# Patient Record
Sex: Male | Born: 1940
Health system: Southern US, Community
[De-identification: ages and names within clinical notes are randomized; demographics above are authoritative.]

## PROBLEM LIST (undated history)

## (undated) DIAGNOSIS — K5730 Diverticulosis of large intestine without perforation or abscess without bleeding: Secondary | ICD-10-CM

## (undated) DIAGNOSIS — H409 Unspecified glaucoma: Secondary | ICD-10-CM

## (undated) DIAGNOSIS — C801 Malignant (primary) neoplasm, unspecified: Secondary | ICD-10-CM

## (undated) DIAGNOSIS — E669 Obesity, unspecified: Secondary | ICD-10-CM

## (undated) DIAGNOSIS — T783XXA Angioneurotic edema, initial encounter: Secondary | ICD-10-CM

## (undated) DIAGNOSIS — L723 Sebaceous cyst: Secondary | ICD-10-CM

## (undated) DIAGNOSIS — Z862 Personal history of diseases of the blood and blood-forming organs and certain disorders involving the immune mechanism: Secondary | ICD-10-CM

## (undated) DIAGNOSIS — E0590 Thyrotoxicosis, unspecified without thyrotoxic crisis or storm: Secondary | ICD-10-CM

## (undated) DIAGNOSIS — M25511 Pain in right shoulder: Secondary | ICD-10-CM

## (undated) DIAGNOSIS — Z8546 Personal history of malignant neoplasm of prostate: Secondary | ICD-10-CM

## (undated) DIAGNOSIS — I10 Essential (primary) hypertension: Secondary | ICD-10-CM

## (undated) DIAGNOSIS — E785 Hyperlipidemia, unspecified: Secondary | ICD-10-CM

## (undated) DIAGNOSIS — M25579 Pain in unspecified ankle and joints of unspecified foot: Secondary | ICD-10-CM

## (undated) DIAGNOSIS — Z8639 Personal history of other endocrine, nutritional and metabolic disease: Secondary | ICD-10-CM

## (undated) DIAGNOSIS — D126 Benign neoplasm of colon, unspecified: Secondary | ICD-10-CM

## (undated) HISTORY — DX: Hyperlipidemia, unspecified: E78.5

## (undated) HISTORY — DX: Malignant (primary) neoplasm, unspecified: C80.1

## (undated) HISTORY — DX: Personal history of other endocrine, nutritional and metabolic disease: Z86.39

## (undated) HISTORY — DX: Diverticulosis of large intestine without perforation or abscess without bleeding: K57.30

## (undated) HISTORY — DX: Unspecified glaucoma: H40.9

## (undated) HISTORY — DX: Pain in right shoulder: M25.511

## (undated) HISTORY — PX: APPENDECTOMY: SHX54

## (undated) HISTORY — DX: Obesity, unspecified: E66.9

## (undated) HISTORY — DX: Personal history of diseases of the blood and blood-forming organs and certain disorders involving the immune mechanism: Z86.2

## (undated) HISTORY — DX: Essential (primary) hypertension: I10

## (undated) HISTORY — DX: Thyrotoxicosis, unspecified without thyrotoxic crisis or storm: E05.90

## (undated) HISTORY — DX: Personal history of malignant neoplasm of prostate: Z85.46

## (undated) HISTORY — DX: Benign neoplasm of colon, unspecified: D12.6

## (undated) HISTORY — PX: OTHER SURGICAL HISTORY: SHX169

## (undated) HISTORY — DX: Sebaceous cyst: L72.3

## (undated) HISTORY — DX: Angioneurotic edema, initial encounter: T78.3XXA

## (undated) HISTORY — DX: Pain in unspecified ankle and joints of unspecified foot: M25.579

---

## 2004-12-27 ENCOUNTER — Ambulatory Visit: Payer: Self-pay | Admitting: Family Medicine

## 2005-04-23 ENCOUNTER — Ambulatory Visit: Payer: Self-pay | Admitting: Family Medicine

## 2005-08-22 ENCOUNTER — Emergency Department (HOSPITAL_COMMUNITY): Admission: EM | Admit: 2005-08-22 | Discharge: 2005-08-22 | Payer: Self-pay | Admitting: Emergency Medicine

## 2005-11-12 ENCOUNTER — Ambulatory Visit: Payer: Self-pay | Admitting: Family Medicine

## 2005-12-24 ENCOUNTER — Ambulatory Visit: Payer: Self-pay | Admitting: Family Medicine

## 2006-01-07 ENCOUNTER — Ambulatory Visit: Payer: Self-pay | Admitting: Cardiology

## 2006-01-28 ENCOUNTER — Encounter: Payer: Self-pay | Admitting: Cardiology

## 2006-01-28 ENCOUNTER — Ambulatory Visit: Payer: Self-pay

## 2006-04-06 ENCOUNTER — Ambulatory Visit: Payer: Self-pay | Admitting: Family Medicine

## 2006-04-14 ENCOUNTER — Encounter: Admission: RE | Admit: 2006-04-14 | Discharge: 2006-04-14 | Payer: Self-pay | Admitting: Family Medicine

## 2006-08-26 ENCOUNTER — Ambulatory Visit: Payer: Self-pay | Admitting: Family Medicine

## 2006-11-18 ENCOUNTER — Ambulatory Visit: Payer: Self-pay | Admitting: Family Medicine

## 2006-11-18 LAB — CONVERTED CEMR LAB
ALT: 44 units/L — ABNORMAL HIGH (ref 0–40)
Albumin: 4.6 g/dL (ref 3.5–5.2)
Alkaline Phosphatase: 35 units/L — ABNORMAL LOW (ref 39–117)
BUN: 18 mg/dL (ref 6–23)
Basophils Absolute: 0 10*3/uL (ref 0.0–0.1)
CO2: 30 meq/L (ref 19–32)
Chloride: 98 meq/L (ref 96–112)
Eosinophil percent: 1 % (ref 0.0–5.0)
HCT: 45.2 % (ref 39.0–52.0)
Hemoglobin: 14.6 g/dL (ref 13.0–17.0)
PSA: 2.97 ng/mL (ref 0.10–4.00)
Potassium: 4 meq/L (ref 3.5–5.1)
RDW: 13.4 % (ref 11.5–14.6)
Total Bilirubin: 0.8 mg/dL (ref 0.3–1.2)
Total Protein: 7.4 g/dL (ref 6.0–8.3)

## 2006-12-23 ENCOUNTER — Ambulatory Visit: Payer: Self-pay | Admitting: Cardiology

## 2007-01-11 ENCOUNTER — Ambulatory Visit: Payer: Self-pay | Admitting: Family Medicine

## 2007-01-13 ENCOUNTER — Encounter: Admission: RE | Admit: 2007-01-13 | Discharge: 2007-01-13 | Payer: Self-pay | Admitting: Family Medicine

## 2007-03-23 DIAGNOSIS — Z862 Personal history of diseases of the blood and blood-forming organs and certain disorders involving the immune mechanism: Secondary | ICD-10-CM

## 2007-03-23 DIAGNOSIS — E0590 Thyrotoxicosis, unspecified without thyrotoxic crisis or storm: Secondary | ICD-10-CM

## 2007-03-23 DIAGNOSIS — H409 Unspecified glaucoma: Secondary | ICD-10-CM

## 2007-03-23 DIAGNOSIS — E785 Hyperlipidemia, unspecified: Secondary | ICD-10-CM

## 2007-03-23 DIAGNOSIS — Z8639 Personal history of other endocrine, nutritional and metabolic disease: Secondary | ICD-10-CM

## 2007-03-23 HISTORY — DX: Unspecified glaucoma: H40.9

## 2007-03-23 HISTORY — DX: Hyperlipidemia, unspecified: E78.5

## 2007-03-23 HISTORY — DX: Personal history of diseases of the blood and blood-forming organs and certain disorders involving the immune mechanism: Z86.2

## 2007-03-23 HISTORY — DX: Thyrotoxicosis, unspecified without thyrotoxic crisis or storm: E05.90

## 2007-03-31 ENCOUNTER — Ambulatory Visit: Payer: Self-pay | Admitting: Family Medicine

## 2007-03-31 LAB — CONVERTED CEMR LAB
ALT: 38 units/L (ref 0–40)
AST: 31 units/L (ref 0–37)
BUN: 24 mg/dL — ABNORMAL HIGH (ref 6–23)
CO2: 31 meq/L (ref 19–32)
Total Bilirubin: 0.5 mg/dL (ref 0.3–1.2)
Total Protein: 7.2 g/dL (ref 6.0–8.3)

## 2007-04-01 ENCOUNTER — Encounter: Payer: Self-pay | Admitting: Family Medicine

## 2007-08-25 ENCOUNTER — Ambulatory Visit: Payer: Self-pay | Admitting: Family Medicine

## 2007-08-25 DIAGNOSIS — T783XXA Angioneurotic edema, initial encounter: Secondary | ICD-10-CM

## 2007-08-25 HISTORY — DX: Angioneurotic edema, initial encounter: T78.3XXA

## 2007-09-08 ENCOUNTER — Ambulatory Visit: Payer: Self-pay | Admitting: Family Medicine

## 2007-09-08 DIAGNOSIS — I10 Essential (primary) hypertension: Secondary | ICD-10-CM

## 2007-09-08 HISTORY — DX: Essential (primary) hypertension: I10

## 2007-09-15 ENCOUNTER — Ambulatory Visit: Payer: Self-pay | Admitting: Gastroenterology

## 2007-10-04 ENCOUNTER — Telehealth (INDEPENDENT_AMBULATORY_CARE_PROVIDER_SITE_OTHER): Payer: Self-pay | Admitting: *Deleted

## 2007-10-05 ENCOUNTER — Ambulatory Visit: Payer: Self-pay | Admitting: Gastroenterology

## 2007-10-05 ENCOUNTER — Encounter: Payer: Self-pay | Admitting: Gastroenterology

## 2007-10-05 ENCOUNTER — Encounter: Payer: Self-pay | Admitting: Family Medicine

## 2007-10-22 DIAGNOSIS — D126 Benign neoplasm of colon, unspecified: Secondary | ICD-10-CM | POA: Insufficient documentation

## 2007-10-22 DIAGNOSIS — K5730 Diverticulosis of large intestine without perforation or abscess without bleeding: Secondary | ICD-10-CM

## 2007-10-22 HISTORY — DX: Benign neoplasm of colon, unspecified: D12.6

## 2007-10-22 HISTORY — DX: Diverticulosis of large intestine without perforation or abscess without bleeding: K57.30

## 2007-10-27 ENCOUNTER — Ambulatory Visit: Payer: Self-pay | Admitting: Family Medicine

## 2007-11-09 ENCOUNTER — Encounter: Payer: Self-pay | Admitting: Family Medicine

## 2008-05-03 ENCOUNTER — Ambulatory Visit: Payer: Self-pay | Admitting: Cardiology

## 2008-05-10 ENCOUNTER — Ambulatory Visit: Payer: Self-pay | Admitting: Cardiology

## 2008-05-10 LAB — CONVERTED CEMR LAB
CO2: 29 meq/L (ref 19–32)
Glucose, Bld: 78 mg/dL (ref 70–99)
Potassium: 4.6 meq/L (ref 3.5–5.1)

## 2008-08-02 ENCOUNTER — Ambulatory Visit: Payer: Self-pay | Admitting: Family Medicine

## 2008-08-07 ENCOUNTER — Encounter (INDEPENDENT_AMBULATORY_CARE_PROVIDER_SITE_OTHER): Payer: Self-pay | Admitting: *Deleted

## 2008-08-09 ENCOUNTER — Ambulatory Visit: Payer: Self-pay | Admitting: Cardiology

## 2008-09-12 ENCOUNTER — Ambulatory Visit: Payer: Self-pay | Admitting: Family Medicine

## 2008-09-12 DIAGNOSIS — J019 Acute sinusitis, unspecified: Secondary | ICD-10-CM | POA: Insufficient documentation

## 2008-10-02 ENCOUNTER — Telehealth (INDEPENDENT_AMBULATORY_CARE_PROVIDER_SITE_OTHER): Payer: Self-pay | Admitting: *Deleted

## 2008-10-11 ENCOUNTER — Ambulatory Visit: Payer: Self-pay | Admitting: Cardiology

## 2008-10-11 LAB — CONVERTED CEMR LAB
BUN: 21 mg/dL (ref 6–23)
Calcium: 9.6 mg/dL (ref 8.4–10.5)
GFR calc non Af Amer: 102 mL/min
Glucose, Bld: 99 mg/dL (ref 70–99)

## 2008-11-16 ENCOUNTER — Ambulatory Visit: Payer: Self-pay | Admitting: Family Medicine

## 2008-11-16 DIAGNOSIS — M25579 Pain in unspecified ankle and joints of unspecified foot: Secondary | ICD-10-CM

## 2008-11-16 HISTORY — DX: Pain in unspecified ankle and joints of unspecified foot: M25.579

## 2008-12-13 ENCOUNTER — Ambulatory Visit: Payer: Self-pay | Admitting: Family Medicine

## 2008-12-13 DIAGNOSIS — L723 Sebaceous cyst: Secondary | ICD-10-CM

## 2008-12-13 DIAGNOSIS — J069 Acute upper respiratory infection, unspecified: Secondary | ICD-10-CM | POA: Insufficient documentation

## 2008-12-13 HISTORY — DX: Sebaceous cyst: L72.3

## 2009-01-01 ENCOUNTER — Ambulatory Visit: Payer: Self-pay | Admitting: Family Medicine

## 2009-01-16 ENCOUNTER — Telehealth (INDEPENDENT_AMBULATORY_CARE_PROVIDER_SITE_OTHER): Payer: Self-pay | Admitting: *Deleted

## 2009-02-14 ENCOUNTER — Ambulatory Visit: Payer: Self-pay | Admitting: Cardiology

## 2009-03-02 ENCOUNTER — Encounter: Payer: Self-pay | Admitting: Family Medicine

## 2009-08-08 ENCOUNTER — Ambulatory Visit: Payer: Self-pay | Admitting: Family Medicine

## 2009-08-08 DIAGNOSIS — R972 Elevated prostate specific antigen [PSA]: Secondary | ICD-10-CM | POA: Insufficient documentation

## 2009-08-09 ENCOUNTER — Telehealth (INDEPENDENT_AMBULATORY_CARE_PROVIDER_SITE_OTHER): Payer: Self-pay | Admitting: *Deleted

## 2009-08-22 ENCOUNTER — Telehealth (INDEPENDENT_AMBULATORY_CARE_PROVIDER_SITE_OTHER): Payer: Self-pay | Admitting: *Deleted

## 2009-09-03 ENCOUNTER — Encounter: Payer: Self-pay | Admitting: Family Medicine

## 2009-09-12 ENCOUNTER — Encounter: Payer: Self-pay | Admitting: Family Medicine

## 2009-10-10 ENCOUNTER — Encounter: Payer: Self-pay | Admitting: Family Medicine

## 2009-10-23 ENCOUNTER — Encounter: Payer: Self-pay | Admitting: Family Medicine

## 2009-10-30 ENCOUNTER — Ambulatory Visit: Admission: RE | Admit: 2009-10-30 | Discharge: 2009-12-05 | Payer: Self-pay | Admitting: Radiation Oncology

## 2009-10-31 ENCOUNTER — Encounter: Payer: Self-pay | Admitting: Family Medicine

## 2009-11-14 ENCOUNTER — Encounter: Admission: RE | Admit: 2009-11-14 | Discharge: 2009-11-14 | Payer: Self-pay | Admitting: Urology

## 2009-12-12 ENCOUNTER — Other Ambulatory Visit: Payer: Self-pay | Admitting: Urology

## 2009-12-21 ENCOUNTER — Encounter: Payer: Self-pay | Admitting: Family Medicine

## 2009-12-21 ENCOUNTER — Ambulatory Visit (HOSPITAL_BASED_OUTPATIENT_CLINIC_OR_DEPARTMENT_OTHER): Admission: RE | Admit: 2009-12-21 | Discharge: 2009-12-21 | Payer: Self-pay | Admitting: Urology

## 2010-01-07 ENCOUNTER — Encounter: Payer: Self-pay | Admitting: Family Medicine

## 2010-01-08 ENCOUNTER — Ambulatory Visit: Admission: RE | Admit: 2010-01-08 | Discharge: 2010-01-25 | Payer: Self-pay | Admitting: Radiation Oncology

## 2010-01-09 ENCOUNTER — Encounter: Payer: Self-pay | Admitting: Family Medicine

## 2010-01-22 ENCOUNTER — Encounter: Payer: Self-pay | Admitting: Family Medicine

## 2010-03-22 ENCOUNTER — Encounter: Payer: Self-pay | Admitting: Family Medicine

## 2010-07-03 ENCOUNTER — Encounter: Payer: Self-pay | Admitting: Family Medicine

## 2010-07-22 ENCOUNTER — Encounter (INDEPENDENT_AMBULATORY_CARE_PROVIDER_SITE_OTHER): Payer: Self-pay | Admitting: *Deleted

## 2010-08-07 ENCOUNTER — Encounter (INDEPENDENT_AMBULATORY_CARE_PROVIDER_SITE_OTHER): Payer: Self-pay | Admitting: *Deleted

## 2010-09-18 ENCOUNTER — Ambulatory Visit: Payer: Self-pay | Admitting: Family Medicine

## 2010-10-02 ENCOUNTER — Ambulatory Visit: Payer: Self-pay | Admitting: Family Medicine

## 2010-10-02 ENCOUNTER — Encounter: Payer: Self-pay | Admitting: Family Medicine

## 2010-10-02 DIAGNOSIS — Z8546 Personal history of malignant neoplasm of prostate: Secondary | ICD-10-CM

## 2010-10-02 HISTORY — DX: Personal history of malignant neoplasm of prostate: Z85.46

## 2010-10-02 LAB — CONVERTED CEMR LAB
ALT: 54 units/L — ABNORMAL HIGH (ref 0–53)
AST: 40 units/L — ABNORMAL HIGH (ref 0–37)
Albumin: 4.1 g/dL (ref 3.5–5.2)
BUN: 20 mg/dL (ref 6–23)
Basophils Absolute: 0 10*3/uL (ref 0.0–0.1)
Bilirubin, Direct: 0.1 mg/dL (ref 0.0–0.3)
Calcium: 9.6 mg/dL (ref 8.4–10.5)
Eosinophils Relative: 1.3 % (ref 0.0–5.0)
GFR calc non Af Amer: 95.76 mL/min (ref >60)
Glucose, Urine, Semiquant: NEGATIVE
HDL: 56.3 mg/dL (ref >39.00)
Hgb A1c MFr Bld: 6.3 % (ref 4.6–6.5)
LDL Cholesterol: 97 mg/dL (ref 0–99)
Lymphocytes Relative: 27.4 % (ref 12.0–46.0)
MCHC: 33.8 g/dL (ref 30.0–36.0)
MCV: 96.2 fL (ref 78.0–100.0)
Neutro Abs: 2.4 10*3/uL (ref 1.4–7.7)
Nitrite: NEGATIVE
Platelets: 187 10*3/uL (ref 150.0–400.0)
Potassium: 4.1 meq/L (ref 3.5–5.1)
RDW: 14.6 % (ref 11.5–14.6)
Triglycerides: 106 mg/dL (ref 0.0–149.0)
Urobilinogen, UA: NEGATIVE
WBC Urine, dipstick: NEGATIVE
pH: 6

## 2010-10-18 ENCOUNTER — Encounter: Payer: Self-pay | Admitting: Family Medicine

## 2010-12-17 ENCOUNTER — Encounter (INDEPENDENT_AMBULATORY_CARE_PROVIDER_SITE_OTHER): Payer: Self-pay | Admitting: *Deleted

## 2010-12-18 ENCOUNTER — Ambulatory Visit
Admission: RE | Admit: 2010-12-18 | Discharge: 2010-12-18 | Payer: Self-pay | Source: Home / Self Care | Attending: Gastroenterology | Admitting: Gastroenterology

## 2011-01-03 ENCOUNTER — Ambulatory Visit
Admission: RE | Admit: 2011-01-03 | Discharge: 2011-01-03 | Payer: Self-pay | Source: Home / Self Care | Attending: Gastroenterology | Admitting: Gastroenterology

## 2011-01-03 ENCOUNTER — Encounter: Payer: Self-pay | Admitting: Gastroenterology

## 2011-01-05 LAB — CONVERTED CEMR LAB
ALT: 41 units/L (ref 0–53)
Albumin: 4.3 g/dL (ref 3.5–5.2)
Alkaline Phosphatase: 38 units/L — ABNORMAL LOW (ref 39–117)
BUN: 20 mg/dL (ref 6–23)
Basophils Absolute: 0 10*3/uL (ref 0.0–0.1)
Basophils Absolute: 0 10*3/uL (ref 0.0–0.1)
Basophils Relative: 1.1 % (ref 0.0–3.0)
Bilirubin Urine: NEGATIVE
Bilirubin, Direct: 0 mg/dL (ref 0.0–0.3)
Blood in Urine, dipstick: NEGATIVE
CO2: 33 meq/L — ABNORMAL HIGH (ref 19–32)
Calcium: 9.6 mg/dL (ref 8.4–10.5)
Calcium: 9.8 mg/dL (ref 8.4–10.5)
Chloride: 102 meq/L (ref 96–112)
Cholesterol: 180 mg/dL (ref 0–200)
Creatinine, Ser: 1 mg/dL (ref 0.4–1.5)
Eosinophils Relative: 2.5 % (ref 0.0–5.0)
GFR calc Af Amer: 95 mL/min
Glucose, Bld: 101 mg/dL — ABNORMAL HIGH (ref 70–99)
HCT: 42.4 % (ref 39.0–52.0)
HDL: 53.3 mg/dL (ref >39.00)
HDL: 66.5 mg/dL (ref >39.0)
Ketones, urine, test strip: NEGATIVE
Lymphocytes Relative: 32.4 % (ref 12.0–46.0)
MCHC: 33.7 g/dL (ref 30.0–36.0)
MCHC: 34.3 g/dL (ref 30.0–36.0)
MCV: 97.6 fL (ref 78.0–100.0)
Monocytes Relative: 14.6 % — ABNORMAL HIGH (ref 3.0–12.0)
Neutro Abs: 2.1 10*3/uL (ref 1.4–7.7)
Neutrophils Relative %: 50.1 % (ref 43.0–77.0)
Nitrite: NEGATIVE
Platelets: 186 10*3/uL (ref 150–400)
Potassium: 4.3 meq/L (ref 3.5–5.1)
Potassium: 4.4 meq/L (ref 3.5–5.1)
RBC: 4.43 M/uL (ref 4.22–5.81)
TSH: 2.43 microintl units/mL (ref 0.35–5.50)
Total Bilirubin: 0.7 mg/dL (ref 0.3–1.2)
Total Bilirubin: 0.8 mg/dL (ref 0.3–1.2)
Total CHOL/HDL Ratio: 2.7
WBC Urine, dipstick: NEGATIVE

## 2011-01-08 ENCOUNTER — Encounter: Payer: Self-pay | Admitting: Family Medicine

## 2011-01-09 NOTE — Assessment & Plan Note (Signed)
Summary: lump under arm//fd   Vital Signs:  Patient profile:   70 year old male Weight:      195.6 pounds BMI:     31.68 Temp:     98.5 degrees F oral Pulse rate:   80 / minute Pulse rhythm:   regular BP sitting:   124 / 80  (left arm) Cuff size:   regular  Vitals Entered By: Almeta Monas CMA Duncan Dull) (September 18, 2010 8:27 AM) CC: c/o lump under the right arm, unsure of how long it's been there   History of Present Illness: Pt here c/o lump under R arm that he noticed a few days ago.  Non tender.  No other syptoms.    Current Medications (verified): 1)  Lipitor 40 Mg Tabs (Atorvastatin Calcium) .Marland Kitchen.. 1 By Mouth At Bedtime 2)  Toprol Xl 200 Mg Xr24h-Tab (Metoprolol Succinate) .Marland Kitchen.. 1 By Mouth Once Daily*office Visit Due Now** 3)  Amlodipine Besylate 10 Mg Tabs (Amlodipine Besylate) .... Take 1 Tab Once Daily 4)  Lumigan 0.03 % Soln (Bimatoprost) 5)  Hydrochlorothiazide 12.5 Mg Tabs (Hydrochlorothiazide) .... Take One Tablet By Mouth Daily. 6)  Keflex 500 Mg Caps (Cephalexin) .Marland Kitchen.. 1 By Mouth Two Times A Day  Allergies (verified): 1)  Penicillin G Potassium (Penicillin G Potassium)  Past History:  Past medical, surgical, family and social histories (including risk factors) reviewed for relevance to current acute and chronic problems.  Past Medical History: Reviewed history from 09/08/2007 and no changes required. Hyperlipidemia Hyperthyroidism HISTORY OF INCREASED MUSCLE ENZYME LEVELS ETOH USE Hypertension  Past Surgical History: Reviewed history from 03/23/2007 and no changes required. Appendectomy  Family History: Reviewed history from 08/02/2008 and no changes required. Family History Diabetes 1st degree relative Family History High cholesterol Family History Hypertension Family History of CAD Male 1st degree relative 72 yo F--CA spine S--CA pancreas B- brain tumor  Social History: Reviewed history from 08/02/2008 and no changes required. Occupation:   Medical illustrator for [employer] Divorced Never Smoked Alcohol use-yes Drug use-no Regular exercise-yes  Review of Systems      See HPI  Physical Exam  General:  Well-developed,well-nourished,in no acute distress; alert,appropriate and cooperative throughout examination Skin:  R axilla---+ nodule , soft, moveable, + head on it nontender Cervical Nodes:  No lymphadenopathy noted Psych:  Cognition and judgment appear intact. Alert and cooperative with normal attention span and concentration. No apparent delusions, illusions, hallucinations   Impression & Recommendations:  Problem # 1:  SEBACEOUS CYST (ICD-706.2) keflex for 10 day consider [place] R axilla if it doesn't resolve warm compresses  Complete Medication List: 1)  Lipitor 40 Mg Tabs (Atorvastatin calcium) .Marland Kitchen.. 1 by mouth at bedtime 2)  Toprol Xl 200 Mg Xr24h-tab (Metoprolol succinate) .Marland Kitchen.. 1 by mouth once daily*office visit due now** 3)  Amlodipine Besylate 10 Mg Tabs (Amlodipine besylate) .... Take 1 tab once daily 4)  Lumigan 0.03 % Soln (Bimatoprost) 5)  Hydrochlorothiazide 12.5 Mg Tabs (Hydrochlorothiazide) .... Take one tablet by mouth daily. 6)  Keflex 500 Mg Caps (Cephalexin) .Marland Kitchen.. 1 by mouth two times a day  Patient Instructions: 1)  keep cpe appointment and we will re evaluate Prescriptions: KEFLEX 500 MG CAPS (CEPHALEXIN) 1 by mouth two times a day  #20 x 0   Entered and Authorized by:   Loreen Freud DO   Signed by:   Loreen Freud DO on 09/18/2010   Method used:   Electronically to        [facility] [address] (retail)  [address]       [place], [place]  [address]       Ph: [phone]       Fax: [phone]   RxID:   [id number]

## 2011-01-09 NOTE — Letter (Signed)
Summary: [facility] Instructions  [facility]  [address] [place], [place] [address]   Phone: [phone]  Fax: [phone]       [name] Gibson    01-18-1941    MRN: [id number]        Procedure Day Dorna Bloom:  Farrell Ours 01/03/11     Arrival Time: 2:30pm     Procedure Time: 3:30pm     Location of Procedure:                    _X _  [facility] (4th Floor)                        PREPARATION FOR COLONOSCOPY WITH MOVIPREP   Starting 5 days prior to your procedure  SUNDAY 01/22 do not eat nuts, seeds, popcorn, corn, beans, peas,  salads, or any raw vegetables.  Do not take any fiber supplements (e.g. Metamucil, Citrucel, and Benefiber).  THE DAY BEFORE YOUR PROCEDURE         DATE:  THURSDAY  01/26  1.  Drink clear liquids the entire day-NO SOLID FOOD  2.  Do not drink anything colored red or purple.  Avoid juices with pulp.  No orange juice.  3.  Drink at least 64 oz. (8 glasses) of fluid/clear liquids during the day to prevent dehydration and help the prep work efficiently.  CLEAR LIQUIDS INCLUDE: Water Jello Ice Popsicles Tea (sugar ok, no milk/cream) Powdered fruit flavored drinks Coffee (sugar ok, no milk/cream) Gatorade Juice: apple, white grape, white cranberry  Lemonade Clear bullion, consomm, broth Carbonated beverages (any kind) Strained chicken noodle soup Hard Candy                             4.  In the morning, mix first dose of MoviPrep solution:    Empty 1 Pouch A and 1 Pouch B into the disposable container    Add lukewarm drinking water to the top line of the container. Mix to dissolve    Refrigerate (mixed solution should be used within 24 hrs)  5.  Begin drinking the prep at 5:00 p.m. The MoviPrep container is divided by 4 marks.   Every 15 minutes drink the solution down to the next mark (approximately 8 oz) until the full liter is complete.   6.  Follow completed prep with 16 oz of clear liquid of your choice (Nothing  red or purple).  Continue to drink clear liquids until bedtime.  7.  Before going to bed, mix second dose of MoviPrep solution:    Empty 1 Pouch A and 1 Pouch B into the disposable container    Add lukewarm drinking water to the top line of the container. Mix to dissolve    Refrigerate  THE DAY OF YOUR PROCEDURE      DATE: FRIDAY  01/27  Beginning at  10:30 a.m. (5 hours before procedure):         1. Every 15 minutes, drink the solution down to the next mark (approx 8 oz) until the full liter is complete.  2. Follow completed prep with 16 oz. of clear liquid of your choice.    3. You may drink clear liquids until 1:30pm  (2 HOURS BEFORE PROCEDURE).   MEDICATION INSTRUCTIONS  Unless otherwise instructed, you should take regular prescription medications with a small sip of water   as early as possible the  morning of your procedure.  Additional medication instructions: Do not take HCTZ day of procedure.         OTHER INSTRUCTIONS  You will need a responsible adult at least 72 years of age to accompany you and drive you home.   This person must remain in the waiting room during your procedure.  Wear loose fitting clothing that is easily removed.  Leave jewelry and other valuables at home.  However, you may wish to bring a book to read or  an iPod/MP3 player to listen to music as you wait for your procedure to start.  Remove all body piercing jewelry and leave at home.  Total time from sign-in until discharge is approximately 2-3 hours.  You should go home directly after your procedure and rest.  You can resume normal activities the  day after your procedure.  The day of your procedure you should not:   Drive   Make legal decisions   Operate machinery   Drink alcohol   Return to work  You will receive specific instructions about eating, activities and medications before you leave.    The above instructions have been reviewed and explained to me by   [name] RN  December 18, 2010 10:36 AM    I fully understand and can verbalize these instructions _____________________________ Date _________

## 2011-01-09 NOTE — Procedures (Signed)
Summary: Colonoscopy  Patient: [name] Gibson Note: All result statuses are Final unless otherwise noted.  Tests: (1) Colonoscopy (COL)   COL Colonoscopy           DONE     [facility]     [address]     [place], [place]  [address]           COLONOSCOPY PROCEDURE REPORT     PATIENT:  [name] Gibson, [name] Gibson  MR#:  [id number]     BIRTHDATE:  1941-01-31, 69 yrs. old  GENDER:  male     ENDOSCOPIST:  Judie Petit T. Russella Dar, MD, [facility]           PROCEDURE DATE:  01/03/2011     PROCEDURE:  Colonoscopy [address]     ASA CLASS:  Class II     INDICATIONS:  1) surveillance and high-risk screening  2) history     of pre-cancerous (adenomatous) colon polyps: 06/2002, 09/2007.     MEDICATIONS:   Fentanyl 75 mcg IV, Versed 8 mg IV     DESCRIPTION OF PROCEDURE:   After the risks benefits and     alternatives of the procedure were thoroughly explained, informed     consent was obtained.  Digital rectal exam was performed and     revealed no abnormalities.   The LB [id number]-[id number] endoscope     was introduced through the anus and advanced to the cecum, which     was identified by both the appendix and ileocecal valve, without     limitations.  The quality of the prep was good, using MoviPrep.     The instrument was then slowly withdrawn as the colon was fully     examined.     <<PROCEDUREIMAGES>>     FINDINGS:  Scattered diverticula were found in the sigmoid to     ascending colon.  Otherwise a normal colonoscopy without polyps,     masses, vascular ectasias, or inflammatory changes.  Retroflexed     views in the rectum revealed no abnormalities.  The time to cecum     =  2  minutes. The scope was then withdrawn (time =  8.33  min)     from the patient and the procedure completed.     COMPLICATIONS:  None           ENDOSCOPIC IMPRESSION:     1) Diverticula, scattered in the sigmoid to ascending colon           RECOMMENDATIONS:     1) High fiber diet with liberal fluid intake.     2) Repeat  Colonoscopy in 5 years.           Venita Lick. Russella Dar, MD, [facility]           CC:  Lelon Perla, DO           n.     eSIGNED:   Venita Lick. Stark at 01/03/2011 03:45 PM           Theodis Shove, [id number]  Note: An exclamation mark (!) indicates a result that was not dispersed into the flowsheet. Document Creation Date: 01/03/2011 3:45 PM _______________________________________________________________________  (1) Order result status: Final Collection or observation date-time: 01/03/2011 15:41 Requested date-time:  Receipt date-time:  Reported date-time:  Referring Physician:   Ordering Physician: Claudette Head [phone]) Specimen Source:  Source: Launa Grill Order Number: [id number] Lab site:   Appended Document: Colonoscopy    Clinical Lists Changes  Observations:  Added new observation of COLONNXTDUE: 12/2015 (01/03/2011 15:55)      Appended Document: Colonoscopy    Clinical Lists Changes  Observations: Added new observation of COLONNXTDUE: 12/2015 (01/03/2011 16:14)

## 2011-01-09 NOTE — Letter (Signed)
[facility]   Imported By: Lanelle Bal 01/24/2010 11:31:52  _____________________________________________________________________  External Attachment:    Type:   Image     Comment:   External Document

## 2011-01-09 NOTE — Medication Information (Signed)
Summary: Noncompliant with Lipitor/[employer]  Noncompliant with Lipitor/[employer]   Imported By: Lanelle Bal 11/01/2010 12:45:57  _____________________________________________________________________  External Attachment:    Type:   Image     Comment:   External Document

## 2011-01-09 NOTE — Letter (Signed)
Summary: Colonoscopy Letter  [facility]  [address] [place], [place] [address]   Phone: [phone]  Fax: [phone]      August 07, 2010 MRN: [id number]   [name] Gibson [address] [place], [place]  [address]   Dear Mr. Gibson,   According to your medical record, it is time for you to schedule a Colonoscopy. The American Cancer Society recommends this procedure as a method to detect early colon cancer. Patients with a family history of colon cancer, or a personal history of colon polyps or inflammatory bowel disease are at increased risk.  This letter has beeen generated based on the recommendations made at the time of your procedure. If you feel that in your particular situation this may no longer apply, please contact our office.  Please call our office at [phone] to schedule this appointment or to update your records at your earliest convenience.  Thank you for cooperating with [place] to provide you with the very best care possible.   Sincerely,  Judie Petit T. Russella Dar, M.D.  [facility] [phone]

## 2011-01-09 NOTE — Medication Information (Signed)
Summary: Nonadherence with Lipitor/[employer]  Nonadherence with Lipitor/[employer]   Imported By: Lanelle Bal 03/28/2010 12:38:27  _____________________________________________________________________  External Attachment:    Type:   Image     Comment:   External Document

## 2011-01-09 NOTE — Letter (Signed)
[facility]   Imported By: Lanelle Bal 01/14/2010 08:55:33  _____________________________________________________________________  External Attachment:    Type:   Image     Comment:   External Document

## 2011-01-09 NOTE — Letter (Signed)
[facility]   Imported By: Lanelle Bal 02/04/2010 08:35:13  _____________________________________________________________________  External Attachment:    Type:   Image     Comment:   External Document

## 2011-01-09 NOTE — Assessment & Plan Note (Signed)
Summary: cpx//[name] Gibson will be fasting//lch   Vital Signs:  Patient profile:   70 year old male Height:      66 inches Weight:      193.0 pounds Temp:     97.0 degrees F oral Pulse rate:   76 / minute Pulse rhythm:   regular BP sitting:   130 / 76  (left arm) Cuff size:   regular  Vitals Entered By: Almeta Monas CMA Duncan Dull) (October 02, 2010 8:31 AM) CC: cpx/fasting  Vision Screening:      Vision Comments: optho--q25m  40db HL: Left  Right  Audiometry Comment: grossly normal    History of Present Illness: [name] Gibson here for cpe and labs.  No complaints.  [name] Gibson seeing Dr Patsi Sears q6 months to have prostate checked. -- [name] Gibson is s/p seed implantation. [name] Gibson states lump under arm is a little smaller but is still there.    Preventive Screening-Counseling & Management  Alcohol-Tobacco     Alcohol drinks/day: <1     Alcohol type: VO     Smoking Status: never  Caffeine-Diet-Exercise     Caffeine use/day: 0     Does Patient Exercise: yes     Type of exercise: walking      Exercise (avg: min/session): >60     Times/week: 3     Exercise Counseling: to improve exercise regimen  Hep-HIV-STD-Contraception     HIV Risk: no     Dental Visit-last 6 months yes     Dental Care Counseling: not indicated; dental care within six months  Safety-Violence-Falls     Seat Belt Use: yes      Sexual History:  divorced.        Drug Use:  never and no.    Current Medications (verified): 1)  Lipitor 40 Mg Tabs (Atorvastatin Calcium) .Marland Kitchen.. 1 By Mouth At Bedtime 2)  Toprol Xl 200 Mg Xr24h-Tab (Metoprolol Succinate) .Marland Kitchen.. 1 By Mouth Once Daily*office Visit Due Now** 3)  Amlodipine Besylate 10 Mg Tabs (Amlodipine Besylate) .... Take 1 Tab Once Daily 4)  Lumigan 0.03 % Soln (Bimatoprost) 5)  Hydrochlorothiazide 12.5 Mg Tabs (Hydrochlorothiazide) .... Take One Tablet By Mouth Daily.  Allergies (verified): 1)  Penicillin G Potassium (Penicillin G Potassium)  Past History:  Family History: Last updated:  08/02/2008 Family History Diabetes 1st degree relative Family History High cholesterol Family History Hypertension Family History of CAD Male 1st degree relative 70 yo F--CA spine S--CA pancreas B- brain tumor  Social History: Last updated: 08/02/2008 Occupation:  Medical illustrator for [employer] Divorced Never Smoked Alcohol use-yes Drug use-no Regular exercise-yes  Risk Factors: Alcohol Use: <1 (10/02/2010) Caffeine Use: 0 (10/02/2010) Exercise: yes (10/02/2010)  Risk Factors: Smoking Status: never (10/02/2010)  Past Medical History: Hyperlipidemia Hyperthyroidism HISTORY OF INCREASED MUSCLE ENZYME LEVELS ETOH USE Hypertension Prostate cancer, hx of  Past Surgical History: Appendectomy Prostate-- radioactive seed implantation  12/2009  Family History: Reviewed history from 08/02/2008 and no changes required. Family History Diabetes 1st degree relative Family History High cholesterol Family History Hypertension Family History of CAD Male 1st degree relative 72 yo F--CA spine S--CA pancreas B- brain tumor  Social History: Reviewed history from 08/02/2008 and no changes required. 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 Eyes:  Denies blurring, discharge, double vision, eye irritation, eye pain,  halos, itching, light sensitivity, red eye, vision loss-1 eye, and vision loss-both eyes. ENT:  Denies decreased hearing, difficulty swallowing, ear discharge, earache, hoarseness, nasal congestion, nosebleeds, postnasal drainage, ringing in ears, sinus pressure, and sore throat. CV:  Denies bluish discoloration of lips or nails, chest pain or discomfort, difficulty breathing at night, difficulty breathing while lying down, fainting, fatigue, leg  cramps with exertion, lightheadness, near fainting, palpitations, shortness of breath with exertion, swelling of feet, swelling of hands, and weight gain. Resp:  Denies chest discomfort, chest pain with inspiration, cough, coughing up blood, excessive snoring, hypersomnolence, morning headaches, pleuritic, shortness of breath, sputum productive, and wheezing. GI:  Denies abdominal pain, bloody stools, change in bowel habits, constipation, dark tarry stools, diarrhea, excessive appetite, gas, hemorrhoids, indigestion, loss of appetite, nausea, vomiting, vomiting blood, and yellowish skin color. GU:  Denies decreased libido, discharge, dysuria, erectile dysfunction, genital sores, hematuria, incontinence, nocturia, urinary frequency, and urinary hesitancy; urologist---Dr Patsi Sears. MS:  Denies joint pain, joint redness, joint swelling, loss of strength, low back pain, mid back pain, muscle aches, muscle , cramps, muscle weakness, stiffness, and thoracic pain. Derm:  Denies changes in color of skin, changes in nail beds, dryness, excessive perspiration, flushing, hair loss, insect bite(s), itching, lesion(s), poor wound healing, and rash. Neuro:  Denies brief paralysis, difficulty with concentration, disturbances in coordination, falling down, headaches, inability to speak, memory loss, numbness, poor balance, seizures, sensation of room spinning, tingling, tremors, visual disturbances, and weakness. Psych:  Denies alternate hallucination ( auditory/visual), anxiety, depression, easily angered, easily tearful, irritability, mental problems, panic attacks, sense of great danger, suicidal thoughts/plans, thoughts of violence, unusual visions or sounds, and thoughts /plans of harming others. Endo:  Denies cold intolerance, excessive hunger, excessive thirst, excessive urination, heat intolerance, polyuria, and weight change. Heme:  Denies abnormal bruising, bleeding, enlarge lymph nodes, fevers, pallor, and skin  discoloration. Allergy:  Denies hives or rash, itching eyes, persistent infections, seasonal allergies, and sneezing.  Physical Exam  General:  Well-developed,well-nourished,in no acute distress; alert,appropriate and cooperative throughout examination Head:  Normocephalic and atraumatic without obvious abnormalities. No apparent alopecia or balding. Eyes:  pupils equal, pupils round, pupils reactive to light, and no injection.   Ears:  External ear exam shows no significant lesions or deformities.  Otoscopic examination reveals clear canals, tympanic membranes are intact bilaterally without bulging, retraction, inflammation or discharge. Hearing is grossly normal bilaterally. Nose:  External nasal examination shows no deformity or inflammation. Nasal mucosa are pink and moist without lesions or exudates. Mouth:  Oral mucosa and oropharynx without lesions or exudates.  Teeth in good repair. Neck:  No deformities, masses, or tenderness noted. Chest Wall:  No deformities, masses, tenderness or gynecomastia noted. Lungs:  Normal respiratory effort, chest expands symmetrically. Lungs are clear to auscultation, no crackles or wheezes. Heart:  Normal rate and regular rhythm. S1 and S2 normal without gallop, murmur, click, rub or other extra sounds. Abdomen:  Bowel sounds positive,abdomen soft and non-tender without masses, organomegaly or hernias noted. Rectal:  urology Genitalia:  urology Prostate:  urology Msk:  No deformity or scoliosis noted of thoracic or lumbar spine.   Pulses:  R and L carotid,radial,femoral,dorsalis pedis and posterior tibial pulses are full and equal bilaterally Extremities:  No clubbing, cyanosis, edema, or deformity noted with normal full range of motion of all joints.   Neurologic:  No cranial nerve deficits noted. Station and gait are normal. Plantar reflexes are down-going bilaterally. DTRs are symmetrical throughout. Sensory, motor and coordinative functions  appear  intact. Skin:  Intact without suspicious lesions or rashes Cervical Nodes:  No lymphadenopathy noted Axillary Nodes:  small cyst R axilla--much better than last visit Psych:  Cognition and judgment appear intact. Alert and cooperative with normal attention span and concentration. No apparent delusions, illusions, hallucinations   Impression & Recommendations:  Problem # 1:  PREVENTIVE HEALTH CARE (ICD-V70.0)  Orders: Venipuncture (36644) TLB-Lipid Panel (80061-LIPID) TLB-BMP (Basic Metabolic Panel-BMET) (80048-METABOL) TLB-CBC Platelet - w/Differential (85025-CBCD) TLB-Hepatic/Liver Function Pnl (80076-HEPATIC) TLB-TSH (Thyroid Stimulating Hormone) (84443-TSH) TLB-A1C / Hgb A1C (Glycohemoglobin) (83036-A1C) Specimen Handling (03474) UA Dipstick W/ Micro (manual) (81000) EKG w/ Interpretation (93000)  Reviewed preventive care protocols, scheduled due services, and updated immunizations.  Problem # 2:  PROSTATE CANCER, HX OF (ICD-V10.46)  PSA and prostate checked by urology 2xa year  Orders: EKG w/ Interpretation (93000)  Problem # 3:  HYPERTENSION (ICD-401.9)  His updated medication list for this problem includes:    Toprol Xl 200 Mg Xr24h-tab (Metoprolol succinate) .Marland Kitchen... 1 by mouth once daily*office visit due now**    Amlodipine Besylate 10 Mg Tabs (Amlodipine besylate) .Marland Kitchen... Take 1 tab once daily    Hydrochlorothiazide 12.5 Mg Tabs (Hydrochlorothiazide) .Marland Kitchen... Take one tablet by mouth daily.  Orders: Venipuncture (25956) TLB-Lipid Panel (80061-LIPID) TLB-BMP (Basic Metabolic Panel-BMET) (80048-METABOL) TLB-CBC Platelet - w/Differential (85025-CBCD) TLB-Hepatic/Liver Function Pnl (80076-HEPATIC) TLB-TSH (Thyroid Stimulating Hormone) (84443-TSH) TLB-A1C / Hgb A1C (Glycohemoglobin) (83036-A1C) Specimen Handling (38756) EKG w/ Interpretation (93000)  BP today: 130/76 Prior BP: 124/80 (09/18/2010)  Prior 10 Yr Risk Heart Disease: 22 % (08/08/2009)  Labs  Reviewed: K+: 4.3 (08/08/2009) Creat: : 1.0 (08/08/2009)   Chol: 153 (08/08/2009)   HDL: 53.30 (08/08/2009)   LDL: 85 (08/08/2009)   TG: 73.0 (08/08/2009)  Problem # 4:  GLUCOSE INTOLERANCE, HX OF (ICD-V12.2)  Orders: Venipuncture (43329) TLB-Lipid Panel (80061-LIPID) TLB-BMP (Basic Metabolic Panel-BMET) (80048-METABOL) TLB-CBC Platelet - w/Differential (85025-CBCD) TLB-Hepatic/Liver Function Pnl (80076-HEPATIC) TLB-TSH (Thyroid Stimulating Hormone) (84443-TSH) TLB-A1C / Hgb A1C (Glycohemoglobin) (83036-A1C) Specimen Handling (51884) EKG w/ Interpretation (93000)  Problem # 5:  HYPERLIPIDEMIA (ICD-272.4)  His updated medication list for this problem includes:    Lipitor 40 Mg Tabs (Atorvastatin calcium) .Marland Kitchen... 1 by mouth at bedtime  Orders: Venipuncture (16606) TLB-Lipid Panel (80061-LIPID) TLB-BMP (Basic Metabolic Panel-BMET) (80048-METABOL) TLB-CBC Platelet - w/Differential (85025-CBCD) TLB-Hepatic/Liver Function Pnl (80076-HEPATIC) TLB-TSH (Thyroid Stimulating Hormone) (84443-TSH) TLB-A1C / Hgb A1C (Glycohemoglobin) (83036-A1C) Specimen Handling (30160) EKG w/ Interpretation (93000)  Labs Reviewed: SGOT: 37 (08/08/2009)   SGPT: 46 (08/08/2009)  Prior 10 Yr Risk Heart Disease: 22 % (08/08/2009)   HDL:53.30 (08/08/2009), 66.5 (08/02/2008)  LDL:85 (08/08/2009), 104 (10/93/2355)  Chol:153 (08/08/2009), 180 (08/02/2008)  Trig:73.0 (08/08/2009), 49 (08/02/2008)  Complete Medication List: 1)  Lipitor 40 Mg Tabs (Atorvastatin calcium) .Marland Kitchen.. 1 by mouth at bedtime 2)  Toprol Xl 200 Mg Xr24h-tab (Metoprolol succinate) .Marland Kitchen.. 1 by mouth once daily*office visit due now** 3)  Amlodipine Besylate 10 Mg Tabs (Amlodipine besylate) .... Take 1 tab once daily 4)  Lumigan 0.03 % Soln (Bimatoprost) 5)  Hydrochlorothiazide 12.5 Mg Tabs (Hydrochlorothiazide) .... Take one tablet by mouth daily.  Other Orders: Tdap => 38yrs IM (73220) Admin 1st Vaccine (25427) Admin 1st Vaccine (06237) Flu  Vaccine 35yrs + (62831) Prescriptions: LIPITOR 40 MG TABS (ATORVASTATIN CALCIUM) 1 by mouth at bedtime  #30 Each x 5   Entered and Authorized by:   Loreen Freud DO   Signed by:   Loreen Freud DO on 10/02/2010   Method used:   Print then  Give to Patient   RxID:   [id number]    Orders Added: 1)  Venipuncture [57322] 2)  TLB-Lipid Panel [80061-LIPID] 3)  TLB-BMP (Basic Metabolic Panel-BMET) [80048-METABOL] 4)  TLB-CBC Platelet - w/Differential [85025-CBCD] 5)  TLB-Hepatic/Liver Function Pnl [80076-HEPATIC] 6)  TLB-TSH (Thyroid Stimulating Hormone) [84443-TSH] 7)  TLB-A1C / Hgb A1C (Glycohemoglobin) [83036-A1C] 8)  Tdap => 15yrs IM [90715] 9)  Admin 1st Vaccine [90471] 10)  Admin 1st Vaccine [90471] 11)  Flu Vaccine 22yrs + [02542] 12)  Specimen Handling [99000] 13)  UA Dipstick W/ Micro (manual) [81000] 14)  Est. Patient 40-64 years [99396] 15)  EKG w/ Interpretation [93000]   Immunizations Administered:  Tetanus Vaccine:    Vaccine Type: Tdap    Site: right deltoid    Mfr: Merck    Dose: 0.5 ml    Route: IM    Given by: Almeta Monas CMA (AAMA)    Exp. Date: 09/26/2012    Lot #: [id number]    VIS given: 10/25/08 version given October 02, 2010.   Immunizations Administered:  Tetanus Vaccine:    Vaccine Type: Tdap    Site: right deltoid    Mfr: Merck    Dose: 0.5 ml    Route: IM    Given by: Almeta Monas CMA (AAMA)    Exp. Date: 09/26/2012    Lot #: [id number]    VIS given: 10/25/08 version given October 02, 2010. Flu Vaccine Consent Questions     Do you have a history of severe allergic reactions to this vaccine? no    Any prior history of allergic reactions to egg and/or gelatin? no    Do you have a sensitivity to the preservative Thimersol? no    Do you have a past history of Guillan-Barre Syndrome? no    Do you currently have an acute febrile illness? no    Have you ever had a severe reaction to latex? no    Vaccine information given and explained to  patient? yes    Are you currently pregnant? no    Lot Number:[id number]   Exp Date:06/07/2011   Site Given  Left Deltoid IM    Exp. Date: 09/26/2012    Lot #: [id number]    VIS given: 10/25/08 version given October 02, 2010.   Marland Kitchenlbflu1   Laboratory Results   Urine Tests   Date/Time Reported: October 02, 2010 10:44 AM   Routine Urinalysis   Color: yellow Appearance: Clear Glucose: negative   (Normal Range: Negative) Bilirubin: negative   (Normal Range: Negative) Ketone: negative   (Normal Range: Negative) Spec. Gravity: <1.005   (Normal Range: 1.003-1.035) Blood: negative   (Normal Range: Negative) pH: 6.0   (Normal Range: 5.0-8.0) Protein: negative   (Normal Range: Negative) Urobilinogen: negative   (Normal Range: 0-1) Nitrite: negative   (Normal Range: Negative) Leukocyte Esterace: negative   (Normal Range: Negative)    Comments: Floydene Flock  October 02, 2010 10:45 AM

## 2011-01-09 NOTE — Letter (Signed)
Summary: Primary Care Appointment Letter  [facility]  [address] [place], [place] [address]   Phone: [phone]  Fax: [phone]    07/22/2010 MRN: [id number]  [name] Gibson [address] [place], [place]  [address]  Dear Mr. Gibson,   Your Primary Care Physician Loreen Freud DO has indicated that:    ___x____it is time to schedule an appointment.    _______you missed your appointment on______ and need to call and          reschedule.    _______you need to have lab work done.    _______you need to schedule an appointment discuss lab or test results.    _______you need to call to reschedule your appointment that is                       scheduled on _________.     Please call our office as soon as possible. Our phone number is [phone]. Please press option 1. Our office is open 8a-12noon and 1p-5p, Monday through Friday.     Thank you,    [facility]

## 2011-01-09 NOTE — Miscellaneous (Signed)
Summary: [facility]  Clinical Lists Changes  Medications: Added new medication of MOVIPREP 100 GM  SOLR (PEG-KCL-NACL-NASULF-NA ASC-C) As per prep instructions. - Signed Rx of MOVIPREP 100 GM  SOLR (PEG-KCL-NACL-NASULF-NA ASC-C) As per prep instructions.;  #1 x 0;  Signed;  Entered by: [name] RN;  Authorized by: Meryl Dare MD Clementeen Graham;  Method used: Electronically to [facility] [phone] [place], [place], [place]  [address], Ph: [phone], Fax: [phone] Observations: Added new observation of ALLERGY REV: Done (12/18/2010 10:17)    Prescriptions: MOVIPREP 100 GM  SOLR (PEG-KCL-NACL-NASULF-NA ASC-C) As per prep instructions.  #1 x 0   Entered by:   [name] RN   Authorized by:   Meryl Dare MD [facility]   Signed by:   [name] RN on 12/18/2010   Method used:   Electronically to        [facility] [address] (retail)       [phone]       [place], [place]  [address]       Ph: [phone]       Fax: [phone]   RxID:   [id number]

## 2011-01-09 NOTE — Letter (Signed)
Summary: [facility]  [facility]   Imported By: Lanelle Bal 07/12/2010 09:44:04  _____________________________________________________________________  External Attachment:    Type:   Image     Comment:   External Document

## 2011-01-09 NOTE — Consult Note (Signed)
Summary: [facility]  [facility]   Imported By: Lanelle Bal 01/15/2010 08:35:50  _____________________________________________________________________  External Attachment:    Type:   Image     Comment:   External Document

## 2011-01-23 NOTE — Letter (Signed)
Summary: [facility]   [facility]   Imported By: Kassie Mends 01/16/2011 11:02:15  _____________________________________________________________________  External Attachment:    Type:   Image     Comment:   External Document

## 2011-02-23 LAB — COMPREHENSIVE METABOLIC PANEL
ALT: 68 U/L — ABNORMAL HIGH (ref 0–53)
Alkaline Phosphatase: 47 U/L (ref 39–117)
CO2: 29 mEq/L (ref 19–32)
Chloride: 102 mEq/L (ref 96–112)
GFR calc non Af Amer: >60 mL/min (ref >60)
Glucose, Bld: 100 mg/dL — ABNORMAL HIGH (ref 70–99)
Potassium: 4.7 mEq/L (ref 3.5–5.1)
Sodium: 138 mEq/L (ref 135–145)

## 2011-02-23 LAB — CBC
Hemoglobin: 14.3 g/dL (ref 13.0–17.0)
Platelets: 202 10*3/uL (ref 150–400)
RDW: 15.1 % (ref 11.5–15.5)

## 2011-02-23 LAB — PROTIME-INR
INR: 1.03 (ref 0.00–1.49)
Prothrombin Time: 13.4 seconds (ref 11.6–15.2)

## 2011-02-23 LAB — APTT: aPTT: 31 seconds (ref 24–37)

## 2011-04-03 ENCOUNTER — Other Ambulatory Visit: Payer: Self-pay | Admitting: Family Medicine

## 2011-04-22 NOTE — Assessment & Plan Note (Signed)
[facility]                            CARDIOLOGY OFFICE NOTE   NAME:[name] Gibson, [name] Gibson                          MRN:          [id number]  DATE:05/03/2008                            DOB:          1941/09/30    [name] is a pleasant gentleman who I last saw on December 23, 2006.  He has a remote history of a mildly abnormal nuclear study.  However, he  was not having symptoms and we have treated him medically.  We did  repeat his Myoview on January 28, 2006.  At that time he had an  ejection fraction of 62%.  There was no scar or ischemia.  An  echocardiogram on January 28, 2006, showed normal LV function.  There  was no significant aortic insufficiency.  There was trivial mitral  regurgitation.  The patient's blood pressure had been controlled  previously on Diovan and Cardizem by his report.  However, in November  he developed some splitting of his lip as well as some discoloration.  It was felt that this may be related to the Diovan and it was  discontinued.  Note he has been on Diovan for 2 years prior that by his  report.  His blood pressure did increase.  Clonidine was added to his  medical regimen but his blood pressure has remained high and he  therefore asked to be seen again.  Note, he did see a dermatologist and  has been given a cream for his lip, and this apparently is improving.  Note he denies any chest pain, shortness of breath, pedal edema or  syncope.   MEDICATIONS AT PRESENT:  1. Multivitamin daily.  2. Aspirin 325 mg p.o. daily.  3. Eye drops.  4. Prostate herb formula.  5. Clonidine 0.1 mg p.o. b.i.d.  6. Cardizem 240 mg p.o. daily.  7. Lipitor 40 mg daily.   PHYSICAL EXAM TODAY:  A blood pressure of 171/97 and his pulse is 75.  He weighs 176 pounds.  HEENT:  Normal.  NECK:  Supple with no bruits.  CHEST:  Clear.  CARDIOVASCULAR:  A regular rate and rhythm.  ABDOMEN:  No tenderness.  EXTREMITIES:  No edema.   His  electrocardiogram shows a sinus rhythm at a rate of 70.  There is  left axis deviation.  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  There is an RV  conduction delay.   DIAGNOSES:  1. Hypertension.  The patient's blood pressure is elevated today.  It      apparently was controlled on Cardizem and Diovan previously.  His      Diovan was discontinued as it was felt to potentially be causing      splitting of his lip as well as discoloration.  However, after      discontinuing this medication it did not improve.  It is improving      with a cream that has been given to him by a dermatologist.  Note      he denies any wheezing or other symptoms after taking Diovan in the  past.  We will discontinue his clonidine and I will resume at 160      mg p.o. of Diovan.  I have asked him to contact us if he develops      any degree of shortness of breath or oral problems.  If blood      pressure is not controlled on that, then we will increase to 320 mg      p.o. daily.  Of note, I will check a BMET 1 week after initiating      this.  If it still is not controlled, then we can add additional      medications in the future.  However, I would also at that time      check renal Dopplers to exclude the development of renal artery      stenosis given the new problem with controlling his blood pressure.  2. History of mildly abnormal nuclear study in 2004.  His follow-up      was normal and has not had chest pain.  We will not pursue this      further.  3. Hyperlipidemia.  He will continue on his statin,and this is being      managed by Dr. Laury Axon.  4. History of elevated liver functions.  This has been followed up by      Dr. Laury Axon as well.   I will see him back in 6 weeks to review his blood pressure.     Madolyn Frieze Jens Som, MD, [facility]  Electronically Signed    BSC/MedQ  DD: 05/03/2008  DT: 05/03/2008  Job #: [id number]   cc:   Lelon Perla, DO

## 2011-04-22 NOTE — Assessment & Plan Note (Signed)
HEALTHCARE                            CARDIOLOGY OFFICE NOTE   NAME:Gibson, [name]                          MRN:          [id number]  DATE:08/09/2008                            DOB:          [birth year]    [name] is a 70 year old male who I last saw on May 03, 2008.  Note,  we were seeing him for hypertension and a history of mildly abnormal  nuclear study.  Note, his most recent nuclear study on January 28, 2006  was normal with an ejection fraction of 62% and no ischemia or  infarction.  When I last saw him, we changed his blood pressure  medicines to Cardizem and Diovan.  However, he called the office and  stated that the Diovan was again causing splitting of his lips.  We  therefore discontinued his Cardizem and his Diovan and instead placed  him on Norvasc 10 mg p.o. daily and Toprol 50 mg p.o. daily.  Since  then, he feels reasonably well.  He does state he occasionally feels  dizzy for 4-5 minutes immediately after taking his Norvasc.  It only  lasts for 4-5 minutes and he has not had syncope.  He otherwise denies  any dyspnea, chest pain, palpitations, or syncope, and there is no pedal  edema.   MEDICATIONS:  At present include;  1. Multivitamin.  2. Aspirin 81 mg p.o. daily.  3. Eyedrops.  4. Prostate formula.  5. Lipitor 40 mg p.o. daily.  6. Norvasc 10 mg p.o. daily.  7. Toprol 50 mg p.o. daily.   PHYSICAL EXAMINATION:  VITAL SIGNS:  Today shows a blood pressure of  136/81, his pulse is 78.  He weighs 177 pounds.  HEENT:  Normal.  NECK:  Supple with no bruits.  CHEST:  Clear.  CARDIOVASCULAR:  Regular rate and rhythm.  ABDOMEN:  No tenderness.  EXTREMITIES:  No edema.   Electrocardiogram today shows a sinus rhythm at a rate of 78.  There is  a first-degree AV block.  There is left axis deviation.  There are no ST  changes noted.   DIAGNOSES:  1. Hypertension - The patient appears to be tolerating his blood      pressure  medicines reasonably well.  I have asked him to split the      medications taking 1 in the morning and 1 at night.  His blood      pressure appears to be well controlled.  He will continue to track      this at home.  We can adjust as indicated.  Note, I am not      convinced that the Diovan caused his lip splitting.  2. History of mildly abnormal nuclear study - His followup is normal.      We will not pursue this further.  3. Hyperlipidemia - He will continue on his statin.  This is being      managed by Dr. Laury Axon.  4. History of elevated liver functions - This is also being managed by      Dr. Laury Axon.  I will see him back in 6 months.     Madolyn Frieze Jens Som, MD, [facility]  Electronically Signed    BSC/MedQ  DD: 08/09/2008  DT: 08/10/2008  Job #: [id number]

## 2011-04-22 NOTE — Assessment & Plan Note (Signed)
[facility]                            CARDIOLOGY OFFICE NOTE   NAME:[name] Gibson, [name] Gibson                          MRN:          [id number]  DATE:02/14/2009                            DOB:          1941/01/01    The patient is a pleasant gentleman who has a history of a mildly  abnormal nuclear study that we followed.  However, he had a repeat on  January 28, 2006, had a normalized.  There was no scar ischemia and his  ejection fraction was 62%.  He also had an echocardiogram on January 28, 2006, that showed normal LV function.  There is mild bowing of the  anterior leaflet of the mitral valve.  I have seen him in the past for  hypertension as well.  We changed him to Norvasc, Toprol, and HCTZ.  Since I last saw him on August 09, 2008, he is feeling well.  There is  no dyspnea, chest pain, palpitations, or syncope.  There is no pedal  edema.  He states his systolic blood pressure runs in the 125-130 range  at home.   PRESENT MEDICATIONS:  Multivitamin, eyedrops, prostate formula, Norvasc  10 mg p.o. daily, HCTZ 12.5 mg p.o. daily, aspirin 81 mg p.o. daily, and  Toprol 50 mg p.o. daily.   PHYSICAL EXAMINATION:  VITAL SIGNS:  Today shows a blood pressure of  154/88 and his pulse is 70.  HEENT:  Normal.  NECK:  Supple.  CHEST:  Clear.  CARDIOVASCULAR:  Regular rate and rhythm.  ABDOMEN:  No tenderness.  EXTREMITIES:  No edema.   His electrocardiogram shows a sinus rhythm with a first-degree AV block.  There is no RV conduction delay.  There are no ST changes noted.   DIAGNOSES:  1. Hypertension - the patient's blood pressure is minimally elevated      today.  However, he states in general it is controlled at home.  I      think he can continue with his same medications.  I have asked him      to follow up with Dr. Laury Axon concerning this issue.  2. History of mildly abnormal nuclear study - he has followed up in      2007 with normal.  We will not  pursue this further as he is not      having chest pain.  3. Hyperlipidemia - This will be continued to be followed by Dr.      Laury Axon.   We will see him back on an as-needed basis.     Madolyn Frieze Jens Som, MD, [facility]  Electronically Signed   BSC/MedQ  DD: 02/14/2009  DT: 02/15/2009  Job #: [id number]   cc:   Dr. Laury Axon

## 2011-04-25 NOTE — Assessment & Plan Note (Signed)
[place] [facility]                            CARDIOLOGY OFFICE NOTE   NAME:Gibson Gibson                          MRN:          [id number]  DATE:12/23/2006                            DOB:          January 05, 1941    Mr. Gibson Gibson is a pleasant gentleman who has question of a mild  abnormality on a nuclear study in 2004, hypertension, and mild  hyperlipidemia.  When I last saw him in January 2007, we performed a  Myoview, which was performed on January 28, 2006.  The ejection  fraction was noted to be 62%, and there was no ischemia or infarction.  An echocardiogram was also performed on January 28, 2006.  There was  normal LV function.  There was no mention of aortic insufficiency.  There was trivial mitral regurgitation.  Since I last saw him, he has  not had chest pain, shortness of breath, palpitations or syncope.  He  does state that he has had some pain in his right shoulder that improves  with moving his right arm.  There are no other pains noted.   MEDICATIONS:  1. Diovan 320 mg p.o. daily.  2. Multivitamin.  3. Aspirin 325 mg p.o. daily.  4. Eye drops.  5. Cardizem 180 mg p.o. daily.  6. Vitamin C.  7. Prostate herb formula.   PHYSICAL EXAMINATION:  His physical exam today shows a blood pressure of  148/82 and his pulse is 70.  He weighs 175 pounds.  NECK:  Supple.  There are no bruits.  CHEST:  Clear.  CARDIOVASCULAR EXAM:  Regular rate and rhythm.  ABDOMINAL EXAM:  Shows no pulsatile masses and no bruits.  EXTREMITIES:  Show no edema.  His electrocardiogram shows a sinus rhythm at a rate of 70.  There are  no ST changes noted.  His most recent liver functions were normal on  November 18, 2006.  This was with the exception of a mildly elevated  SGPT of 44.   DIAGNOSES:  1. History of mildly abnormal nuclear study in 2004, but normal most      recent nuclear study in 2007.  2. Hypertension.  3. Hyperlipidemia.  4. History of elevated liver  functions.   PLAN:  Mr. Gibson Gibson is doing well from a symptomatic standpoint, and his  blood pressure is well controlled.  His most recent nuclear study showed  normal perfusion and normal LV function.  His echo showed normal LV  function.  We will not pursue further cardiac workup at this point.  He  will continue with risk factor modification.  Dr. Laury Axon is following his  blood pressure, lipids  and liver.  She has also seen him for his increased liver functions.  He  will see me back in 1 year.     Madolyn Frieze Jens Som, MD, [facility]  Electronically Signed    BSC/MedQ  DD: 12/23/2006  DT: 12/23/2006  Job #: [id number]   cc:   Loreen Freud, M.D.

## 2011-05-07 ENCOUNTER — Other Ambulatory Visit: Payer: Self-pay | Admitting: Family Medicine

## 2011-06-10 ENCOUNTER — Other Ambulatory Visit: Payer: Self-pay | Admitting: Family Medicine

## 2011-07-10 ENCOUNTER — Other Ambulatory Visit: Payer: Self-pay | Admitting: Family Medicine

## 2011-08-08 ENCOUNTER — Other Ambulatory Visit: Payer: Self-pay | Admitting: Family Medicine

## 2011-08-20 ENCOUNTER — Other Ambulatory Visit: Payer: Self-pay | Admitting: Cardiology

## 2011-09-10 ENCOUNTER — Other Ambulatory Visit: Payer: Self-pay | Admitting: Family Medicine

## 2011-09-21 ENCOUNTER — Other Ambulatory Visit: Payer: Self-pay | Admitting: Family Medicine

## 2011-09-30 ENCOUNTER — Other Ambulatory Visit: Payer: Self-pay | Admitting: Family Medicine

## 2011-10-14 ENCOUNTER — Encounter: Payer: Self-pay | Admitting: Family Medicine

## 2011-10-15 ENCOUNTER — Encounter: Payer: Self-pay | Admitting: Family Medicine

## 2011-10-15 ENCOUNTER — Ambulatory Visit (INDEPENDENT_AMBULATORY_CARE_PROVIDER_SITE_OTHER): Payer: Managed Care, Other (non HMO) | Admitting: Family Medicine

## 2011-10-15 VITALS — BP 142/90 | HR 66 | Temp 98.6°F | Ht 65.5 in | Wt 193.2 lb

## 2011-10-15 DIAGNOSIS — Z0000 Encounter for general adult medical examination without abnormal findings: Secondary | ICD-10-CM

## 2011-10-15 DIAGNOSIS — E785 Hyperlipidemia, unspecified: Secondary | ICD-10-CM

## 2011-10-15 DIAGNOSIS — Z8546 Personal history of malignant neoplasm of prostate: Secondary | ICD-10-CM

## 2011-10-15 DIAGNOSIS — Z8639 Personal history of other endocrine, nutritional and metabolic disease: Secondary | ICD-10-CM

## 2011-10-15 DIAGNOSIS — R7309 Other abnormal glucose: Secondary | ICD-10-CM

## 2011-10-15 DIAGNOSIS — E0590 Thyrotoxicosis, unspecified without thyrotoxic crisis or storm: Secondary | ICD-10-CM

## 2011-10-15 DIAGNOSIS — I10 Essential (primary) hypertension: Secondary | ICD-10-CM

## 2011-10-15 DIAGNOSIS — Z23 Encounter for immunization: Secondary | ICD-10-CM

## 2011-10-15 DIAGNOSIS — Z862 Personal history of diseases of the blood and blood-forming organs and certain disorders involving the immune mechanism: Secondary | ICD-10-CM

## 2011-10-15 DIAGNOSIS — R739 Hyperglycemia, unspecified: Secondary | ICD-10-CM

## 2011-10-15 LAB — CBC WITH DIFFERENTIAL/PLATELET
Basophils Relative: 0.6 % (ref 0.0–3.0)
Eosinophils Absolute: 0.1 10*3/uL (ref 0.0–0.7)
Eosinophils Relative: 1.1 % (ref 0.0–5.0)
HCT: 43.5 % (ref 39.0–52.0)
Lymphs Abs: 1.5 10*3/uL (ref 0.7–4.0)
MCHC: 33.7 g/dL (ref 30.0–36.0)
MCV: 97.2 fl (ref 78.0–100.0)
Monocytes Absolute: 0.8 10*3/uL (ref 0.1–1.0)
RBC: 4.48 Mil/uL (ref 4.22–5.81)
RDW: 14.7 % — ABNORMAL HIGH (ref 11.5–14.6)

## 2011-10-15 LAB — LIPID PANEL
Cholesterol: 207 mg/dL — ABNORMAL HIGH (ref 0–200)
HDL: 59.7 mg/dL (ref >39.00)
Triglycerides: 97 mg/dL (ref 0.0–149.0)

## 2011-10-15 LAB — HEMOGLOBIN A1C: Hgb A1c MFr Bld: 6.3 % (ref 4.6–6.5)

## 2011-10-15 LAB — BASIC METABOLIC PANEL
Calcium: 9.6 mg/dL (ref 8.4–10.5)
Creatinine, Ser: 0.8 mg/dL (ref 0.4–1.5)
Glucose, Bld: 80 mg/dL (ref 70–99)

## 2011-10-15 LAB — HEPATIC FUNCTION PANEL
AST: 49 U/L — ABNORMAL HIGH (ref 0–37)
Total Bilirubin: 0.8 mg/dL (ref 0.3–1.2)
Total Protein: 7 g/dL (ref 6.0–8.3)

## 2011-10-15 MED ORDER — METOPROLOL SUCCINATE ER 200 MG PO TB24: ORAL_TABLET | ORAL | Status: DC

## 2011-10-15 MED ORDER — HYDROCHLOROTHIAZIDE 12.5 MG PO CAPS: 12.5000 mg | ORAL_CAPSULE | Freq: Every day | ORAL | Status: DC

## 2011-10-15 MED ORDER — ATORVASTATIN CALCIUM 40 MG PO TABS: ORAL_TABLET | ORAL | Status: DC

## 2011-10-15 MED ORDER — AMLODIPINE BESYLATE 10 MG PO TABS: ORAL_TABLET | ORAL | Status: DC

## 2011-10-15 NOTE — Patient Instructions (Signed)
Preventative Care for Adults, Male A healthy lifestyle and preventative care can promote health and wellness. Preventative health guidelines for men include the following key practices:  A routine yearly physical is a good way to check with your caregiver about your health and preventative screening. It is a chance to share any concerns and updates on your health, and to receive a thorough exam.   Visit your dentist for a routine exam and preventative care every 6 months. Brush your teeth twice a day and floss once a day. Good oral hygiene prevents tooth decay and gum disease.   The frequency of eye exams is based on your age, health, family medical history, use of contact lenses, and other factors. Follow your caregiver's recommendations for frequency of eye exams.   Eat a healthy diet. Foods like vegetables, fruits, whole grains, low-fat dairy products, and lean protein foods contain the nutrients you need without too many calories. Decrease your intake of foods high in solid fats, added sugars, and salt. Eat the right amount of calories for you.Get information about a proper diet from your caregiver, if necessary.   Regular physical exercise is one of the most important things you can do for your health. Most adults should get at least 150 minutes of moderate-intensity exercise (any activity that increases your heart rate and causes you to sweat) each week. In addition, most adults need muscle-strengthening exercises on 2 or more days a week.   Maintain a healthy weight. The body mass index (BMI) is a screening tool to identify possible weight problems. It provides an estimate of body fat based on height and weight. Your caregiver can help determine your BMI, and can help you achieve or maintain a healthy weight.For adults 20 years and older:   A BMI below 18.5 is considered underweight.   A BMI of 18.5 to 24.9 is normal.   A BMI of 25 to 29.9 is considered overweight.   A BMI of 30 and  above is considered obese.   Maintain normal blood lipids and cholesterol levels by exercising and minimizing your intake of saturated fat. Eat a balanced diet with plenty of fruit and vegetables. Blood tests for lipids and cholesterol should begin at age 20 and be repeated every 5 years. If your lipid or cholesterol levels are high, you are over 50, or you are a high risk for heart disease, you may need your cholesterol levels checked more frequently.Ongoing high lipid and cholesterol levels should be treated with medicines if diet and exercise are not effective.   If you smoke, find out from your caregiver how to quit. If you do not use tobacco, do not start.   If you choose to drink alcohol, do not exceed 2 drinks per day. One drink is considered to be 12 ounces (355 mL) of beer, 5 ounces (148 mL) of wine, or 1.5 ounces (44 mL) of liquor.   Avoid use of street drugs. Do not share needles with anyone. Ask for help if you need support or instructions about stopping the use of drugs.   High blood pressure causes heart disease and increases the risk of stroke. Your blood pressure should be checked at least every 1 to 2 years. Ongoing high blood pressure should be treated with medicines, if weight loss and exercise are not effective.   If you are 45 to 72 years old, ask your caregiver if you should take aspirin to prevent heart disease.   Diabetes screening involves taking a blood   sample to check your fasting blood sugar level. This should be done once every 3 years, after age 45, if you are within normal weight and without risk factors for diabetes. Testing should be considered at a younger age or be carried out more frequently if you are overweight and have at least 1 risk factor for diabetes.   Colorectal cancer can be detected and often prevented. Most routine colorectal cancer screening begins at the age of 50 and continues through age 75. However, your caregiver may recommend screening at an  earlier age if you have risk factors for colon cancer. On a yearly basis, your caregiver may provide home test kits to check for hidden blood in the stool. Use of a small camera at the end of a tube, to directly examine the colon (sigmoidoscopy or colonoscopy), can detect the earliest forms of colorectal cancer. Talk to your caregiver about this at age 50, when routine screening begins. Direct examination of the colon should be repeated every 5 to 10 years through age 75, unless early forms of pre-cancerous polyps or small growths are found.   Practice safe sex. Use condoms and avoid high-risk sexual practices to reduce the spread of sexually transmitted infections (STIs). STIs include gonorrhea, chlamydia, syphilis, trichomonas, herpes, HPV, and human immunodeficiency virus (HIV). Herpes, HIV, and HPV are viral illnesses that have no cure. They can result in disability, cancer, and death.   A one-time screening for abdominal aortic aneurysm (AAA) and surgical repair of large AAAs by sound wave imaging (ultrasonography) is recommended for ages 65 to 75 years who are current or former smokers.   Healthy men should no longer receive prostate-specific antigen (PSA) blood tests as part of routine cancer screening. Consult with your caregiver about prostate cancer screening.   Use sunscreen with skin protection factor (SPF) of 30 or more. Apply sunscreen liberally and repeatedly throughout the day. You should seek shade when your shadow is shorter than you. Protect yourself by wearing long sleeves, pants, a wide-brimmed hat, and sunglasses year round, whenever you are outdoors.   Once a month, do a whole body skin exam, using a mirror to look at the skin on your back. Notify your caregiver of new moles, moles that have irregular borders, moles that are larger than a pencil eraser, or moles that have changed in shape or color.   Stay current with required immunizations.   Influenza. You need a dose every  fall (or winter). The composition of the flu vaccine changes each year, so being vaccinated once is not enough.   Pneumococcal polysaccharide. You need 1 to 2 doses if you smoke cigarettes or if you have certain chronic medical conditions. You need 1 dose at age 65 (or older) if you have never been vaccinated.   Tetanus, diphtheria, pertussis (Tdap, Td). Get 1 dose of Tdap vaccine if you are younger than age 65 years, are over 65 and have contact with an infant, are a healthcare worker, or simply want to be protected from whooping cough. After that, you need a Td booster dose every 10 years. Consult your caregiver if you have not had at least 3 tetanus and diphtheria-containing shots sometime in your life or have a deep or dirty wound.   HPV. This vaccine is recommended for males 13 through 72 years of age. This vaccine may be given to men 22 through 72 years of age who have not completed the 3 dose series. It is recommended for men through age 26   who have sex with men or whose immune system is weakened because of HIV infection, other illness, or medications. The vaccine is given in 3 doses over 6 months.   Measles, mumps, rubella (MMR). You need at least 1 dose of MMR if you were born in 1957 or later. You may also need a 2nd dose.   Meningococcal. If you are age 19 to 21 years and a first-year college student living in a residence hall, or have one of several medical conditions, you need to get vaccinated against meningococcal disease. You may also need additional booster doses.   Zoster (shingles). If you are age 60 years or older, you should get this vaccine.   Varicella (chickenpox). If you have never had chickenpox or you were vaccinated but received only 1 dose, talk to your caregiver to find out if you need this vaccine.   Hepatitis A. You need this vaccine if you have a specific risk factor for hepatitis A virus infection, or you simply wish to be protected from this disease. The vaccine is  usually given as 2 doses, 6 to 18 months apart.   Hepatitis B. You need this vaccine if you have a specific risk factor for hepatitis B virus infection or you simply wish to be protected from this disease. The vaccine is given in 3 doses, usually over 6 months.  Preventative Service / Frequency Ages 19 to 39  Blood pressure check.** / Every 1 to 2 years.   Lipid and cholesterol check.**/ Every 5 years beginning at age 20.   Skin self-exam. / Monthly.   Influenza immunization.** / Every year.   Pneumococcal polysaccharide immunization.** / 1 to 2 doses if you smoke cigarettes or if you have certain chronic medical conditions.   Tetanus, diphtheria, pertussis (Tdap,Td) immunization. / A one-time dose of Tdap vaccine. After that, you need a Td booster dose every 10 years.   HPV immunization. / 3 doses over 6 months, if 26 and younger.   Measles, mumps, rubella (MMR) immunization. / You need at least 1 dose of MMR if you were born in 1957 or later. You may also need a 2nd dose.   Meningococcal immunization. / 1 dose if you are age 19 to 21 years and a first-year college student living in a residence hall, or have one of several medical conditions, you need to get vaccinated against meningococcal disease. You may also need additional booster doses.   Varicella immunization. **/ Consult your caregiver.   Hepatitis A immunization. ** / Consult your caregiver. 2 doses, 6 to 18 months apart.   Hepatitis B immunization.** / Consult your caregiver. 3 doses usually over 6 months.  Ages 40 to 64  Blood pressure check.** / Every 1 to 2 years.   Lipid and cholesterol check.**/ Every 5 years beginning at age 20.   Fecal occult blood test (FOBT) of stool. / Every year beginning at age 50 and continuing until age 75. You may not have to do this test if you get colonoscopy every 10 years.   Flexible sigmoidoscopy** or colonoscopy.** / Every 5 years for a flexible sigmoidoscopy or every 10 years for  a colonoscopy beginning at age 50 and continuing until age 75.   Skin self-exam. / Monthly.   Influenza immunization.** / Every year.   Pneumococcal polysaccharide immunization.** / 1 to 2 doses if you smoke cigarettes or if you have certain chronic medical conditions.   Tetanus, diphtheria, pertussis (Tdap/Td) immunization.** / A one-time dose of   Tdap vaccine. After that, you need a Td booster dose every 10 years.   Measles, mumps, rubella (MMR) immunization. / You need at least 1 dose of MMR if you were born in 1957 or later. You may also need a 2nd dose.   Varicella immunization. **/ Consult your caregiver.   Meningococcal immunization.** / Consult your caregiver.   Hepatitis A immunization. ** / Consult your caregiver. 2 doses, 6 to 18 months apart.   Hepatitis B immunization.** / Consult your caregiver. 3 doses, usually over 6 months.  Ages 65 and over  Blood pressure check.** / Every 1 to 2 years.   Lipid and cholesterol check.**/ Every 5 years beginning at age 20.   Fecal occult blood test (FOBT) of stool. / Every year beginning at age 50 and continuing until age 75. You may not have to do this test if you get colonoscopy every 10 years.   Flexible sigmoidoscopy** or colonoscopy.** / Every 5 years for a flexible sigmoidoscopy or every 10 years for a colonoscopy beginning at age 50 and continuing until age 75.   Abdominal aortic aneurysm (AAA) screening.** / A one-time screening for ages 65 to 75 years who are current or former smokers.   Skin self-exam. / Monthly.   Influenza immunization.** / Every year.   Pneumococcal polysaccharide immunization.** / 1 dose at age 65 (or older) if you have never been vaccinated.   Tetanus, diphtheria, pertussis (Tdap, Td) immunization. / A one-time dose of Tdap vaccine if you are over 65 and have contact with an infant, are a healthcare worker, or simply want to be protected from whooping cough. After that, you need a Td booster dose  every 10 years.   Varicella immunization. **/ Consult your caregiver.   Meningococcal immunization.** / Consult your caregiver.   Hepatitis A immunization. ** / Consult your caregiver. 2 doses, 6 to 18 months apart.   Hepatitis B immunization.** / Check with your caregiver. 3 doses, usually over 6 months.  **Family history and personal history of risk and conditions may change your caregiver's recommendations. Document Released: 01/20/2002 Document Revised: 08/06/2011 Document Reviewed: 04/21/2011 [employer] Patient Information 2012 [employer]. 

## 2011-10-15 NOTE — Assessment & Plan Note (Signed)
con't meds  Check labs 

## 2011-10-15 NOTE — Assessment & Plan Note (Signed)
Per u rology 

## 2011-10-15 NOTE — Assessment & Plan Note (Signed)
Check labs 

## 2011-10-15 NOTE — Progress Notes (Signed)
Subjective:    Patient ID: [name] Gibson, male    DOB: 1941/02/12, 70 y.o.   MRN: [id number]  HPI Pt here for cpe,  No complaints.   Past Medical History  Diagnosis Date  . Hyperlipidemia   . Hypertension   . Cancer     prostate   Family History  Problem Relation Age of Onset  . Cancer Father     spine  . Cancer Sister     pancrease  . Diabetes Other   . Hyperlipidemia Other   . Hypertension Other   . Coronary artery disease Other    History   Social History  . Marital Status: Divorced    Spouse Name: N/A    Number of Children: N/A  . Years of Education: N/A   Occupational History  . Salesman    Social History Main Topics  . Smoking status: Never Smoker   . Smokeless tobacco: Not on file  . Alcohol Use: Yes  . Drug Use: No  . Sexually Active: Yes -- Male partner(s)   Other Topics Concern  . Not on file   Social History Narrative   Reg exercise--walking 2-3 x a week   Past Surgical History  Procedure Date  . Appendectomy   . Radioactive seed implation     prostate     Review of Systems    Review of Systems  Constitutional: Negative for activity change, appetite change and fatigue.  HENT: Negative for hearing loss, congestion, tinnitus and ear discharge.  dentist q46m Eyes: Negative for visual disturbance (see optho q1y -- vision corrected to 20/20 with glasses).  Respiratory: Negative for cough, chest tightness and shortness of breath.   Cardiovascular: Negative for chest pain, palpitations and leg swelling.  Gastrointestinal: Negative for abdominal pain, diarrhea, constipation and abdominal distention.  Genitourinary: Negative for urgency, frequency, decreased urine volume and difficulty urinating.  Musculoskeletal: Negative for back pain, arthralgias and gait problem.  Skin: Negative for color change, pallor and rash.  Neurological: Negative for dizziness, light-headedness, numbness and headaches.  Hematological: Negative for adenopathy. Does not  bruise/bleed easily.  Psychiatric/Behavioral: Negative for suicidal ideas, confusion, sleep disturbance, self-injury, dysphoric mood, decreased concentration and agitation.    Derm-- Dr Margo Aye  Objective:   Physical Exam  BP 142/90  Pulse 66  Temp(Src) 98.6 F (37 C) (Oral)  Ht 5' 5.5" (1.664 m)  Wt 193 lb 3.2 oz (87.635 kg)  BMI 31.66 kg/m2  SpO2 97%  General Appearance:    Alert, cooperative, no distress, appears stated age  Head:    Normocephalic, without obvious abnormality, atraumatic  Eyes:    PERRL, conjunctiva/corneas clear, EOM's intact, fundi    benign, both eyes       Ears:    Normal TM's and external ear canals, both ears  Nose:   Nares normal, septum midline, mucosa normal, no drainage   or sinus tenderness  Throat:   Lips, mucosa, and tongue normal; teeth and gums normal  Neck:   Supple, symmetrical, trachea midline, no adenopathy;       thyroid:  No enlargement/tenderness/nodules; no carotid   bruit or JVD  Back:     Symmetric, no curvature, ROM normal, no CVA tenderness  Lungs:     Clear to auscultation bilaterally, respirations unlabored  Chest wall:    No tenderness or deformity  Heart:    Regular rate and rhythm, S1 and S2 normal, no murmur, rub   or gallop  Abdomen:     Soft,  non-tender, bowel sounds active all four quadrants,    no masses, no organomegaly  Genitalia:  urology  Rectal:  urology  Extremities:   Extremities normal, atraumatic, no cyanosis or edema  Pulses:   2+ and symmetric all extremities  Skin:   Skin color, texture, turgor normal, no rashes or lesions  Lymph nodes:   Cervical, supraclavicular, and axillary nodes normal  Neurologic:   CNII-XII intact. Normal strength, sensation and reflexes      throughout         Assessment & Plan:  CPe--  [facility] utd             Check fasting labs

## 2011-10-15 NOTE — Assessment & Plan Note (Signed)
Refill meds stable 

## 2011-10-28 ENCOUNTER — Other Ambulatory Visit: Payer: Self-pay | Admitting: Family Medicine

## 2011-10-29 ENCOUNTER — Other Ambulatory Visit (INDEPENDENT_AMBULATORY_CARE_PROVIDER_SITE_OTHER): Payer: Managed Care, Other (non HMO)

## 2011-10-29 DIAGNOSIS — R7402 Elevation of levels of lactic acid dehydrogenase (LDH): Secondary | ICD-10-CM

## 2011-10-29 LAB — HEPATIC FUNCTION PANEL
ALT: 89 U/L — ABNORMAL HIGH (ref 0–53)
AST: 82 U/L — ABNORMAL HIGH (ref 0–37)
Total Bilirubin: 0.6 mg/dL (ref 0.3–1.2)
Total Protein: 7.8 g/dL (ref 6.0–8.3)

## 2011-10-29 LAB — GAMMA GT: GGT: 290 U/L — ABNORMAL HIGH (ref 7–51)

## 2011-10-29 NOTE — Progress Notes (Signed)
Addended by: Legrand Como on: 10/29/2011 11:23 AM   Modules accepted: Orders

## 2011-10-29 NOTE — Progress Notes (Signed)
12  

## 2011-10-31 LAB — HEPATITIS PANEL, ACUTE: HCV Ab: NEGATIVE

## 2011-11-05 ENCOUNTER — Other Ambulatory Visit: Payer: Self-pay | Admitting: Family Medicine

## 2011-11-09 ENCOUNTER — Other Ambulatory Visit: Payer: Self-pay | Admitting: Cardiology

## 2011-11-25 ENCOUNTER — Other Ambulatory Visit: Payer: Self-pay | Admitting: Family Medicine

## 2011-11-26 ENCOUNTER — Other Ambulatory Visit (INDEPENDENT_AMBULATORY_CARE_PROVIDER_SITE_OTHER): Payer: Managed Care, Other (non HMO)

## 2011-11-26 LAB — HEPATIC FUNCTION PANEL
AST: 68 U/L — ABNORMAL HIGH (ref 0–37)
Albumin: 4.2 g/dL (ref 3.5–5.2)
Alkaline Phosphatase: 62 U/L (ref 39–117)
Total Protein: 7.1 g/dL (ref 6.0–8.3)

## 2011-11-26 LAB — LIPID PANEL
Cholesterol: 225 mg/dL — ABNORMAL HIGH (ref 0–200)
HDL: 60.1 mg/dL (ref >39.00)
Total CHOL/HDL Ratio: 4
VLDL: 17.4 mg/dL (ref 0.0–40.0)

## 2011-12-03 ENCOUNTER — Other Ambulatory Visit: Payer: Self-pay | Admitting: Family Medicine

## 2011-12-08 ENCOUNTER — Other Ambulatory Visit (HOSPITAL_BASED_OUTPATIENT_CLINIC_OR_DEPARTMENT_OTHER): Payer: Managed Care, Other (non HMO)

## 2011-12-10 ENCOUNTER — Ambulatory Visit (HOSPITAL_BASED_OUTPATIENT_CLINIC_OR_DEPARTMENT_OTHER)
Admission: RE | Admit: 2011-12-10 | Discharge: 2011-12-10 | Disposition: A | Discharge status: Home / Self Care | Payer: Managed Care, Other (non HMO) | Source: Ambulatory Visit | Attending: Family Medicine | Admitting: Family Medicine

## 2011-12-10 ENCOUNTER — Other Ambulatory Visit (HOSPITAL_BASED_OUTPATIENT_CLINIC_OR_DEPARTMENT_OTHER): Payer: Managed Care, Other (non HMO)

## 2011-12-10 DIAGNOSIS — R7989 Other specified abnormal findings of blood chemistry: Secondary | ICD-10-CM

## 2011-12-10 DIAGNOSIS — R945 Abnormal results of liver function studies: Secondary | ICD-10-CM | POA: Insufficient documentation

## 2011-12-10 DIAGNOSIS — F1021 Alcohol dependence, in remission: Secondary | ICD-10-CM

## 2011-12-11 ENCOUNTER — Other Ambulatory Visit: Payer: Self-pay | Admitting: Cardiology

## 2011-12-17 ENCOUNTER — Other Ambulatory Visit: Payer: Managed Care, Other (non HMO)

## 2012-01-11 ENCOUNTER — Other Ambulatory Visit: Payer: Self-pay | Admitting: Cardiology

## 2012-02-09 ENCOUNTER — Other Ambulatory Visit: Payer: Self-pay | Admitting: Cardiology

## 2012-03-09 ENCOUNTER — Other Ambulatory Visit: Payer: Self-pay | Admitting: Cardiology

## 2012-03-17 ENCOUNTER — Other Ambulatory Visit: Payer: Managed Care, Other (non HMO)

## 2012-04-10 ENCOUNTER — Other Ambulatory Visit: Payer: Self-pay | Admitting: Cardiology

## 2012-04-12 ENCOUNTER — Ambulatory Visit (INDEPENDENT_AMBULATORY_CARE_PROVIDER_SITE_OTHER): Payer: Managed Care, Other (non HMO) | Admitting: Family Medicine

## 2012-04-12 ENCOUNTER — Encounter: Payer: Self-pay | Admitting: Family Medicine

## 2012-04-12 VITALS — BP 148/86 | HR 66 | Temp 98.1°F | Wt 193.2 lb

## 2012-04-12 DIAGNOSIS — M25519 Pain in unspecified shoulder: Secondary | ICD-10-CM

## 2012-04-12 DIAGNOSIS — M25512 Pain in left shoulder: Secondary | ICD-10-CM

## 2012-04-12 NOTE — Patient Instructions (Signed)
Shoulder Pain The shoulder is a ball and socket joint. The muscles and tendons (rotator cuff) are what keep the shoulder in its joint and stable. This collection of muscles and tendons holds in the head (ball) of the humerus (upper arm bone) in the fossa (cup) of the scapula (shoulder blade). Today no reason was found for your shoulder pain. Often pain in the shoulder may be treated conservatively with temporary immobilization. For example, holding the shoulder in one place using a sling for rest. Physical therapy may be needed if problems continue. HOME CARE INSTRUCTIONS   Apply ice to the sore area for 15 to 20 minutes, 3 to 4 times per day for the first 2 days. Put the ice in a plastic bag. Place a towel between the bag of ice and your skin.   If you have or were given a shoulder sling and straps, do not remove for as long as directed by your caregiver or until you see a caregiver for a follow-up examination. If you need to remove it to shower or bathe, move your arm as little as possible.   Sleep on several pillows at night to lessen swelling and pain.   Only take over-the-counter or prescription medicines for pain, discomfort, or fever as directed by your caregiver.   Keep any follow-up appointments in order to avoid any type of permanent shoulder disability or chronic pain problems.  SEEK MEDICAL CARE IF:   Pain in your shoulder increases or new pain develops in your arm, hand, or fingers.   Your hand or fingers are colder than your other hand.   You do not obtain pain relief with the medications or your pain becomes worse.  SEEK IMMEDIATE MEDICAL CARE IF:   Your arm, hand, or fingers are numb or tingling.   Your arm, hand, or fingers are swollen, painful, or turn white or blue.   You develop chest pain or shortness of breath.  MAKE SURE YOU:   Understand these instructions.   Will watch your condition.   Will get help right away if you are not doing well or get worse.    Document Released: 09/03/2005 Document Revised: 11/13/2011 Document Reviewed: 11/08/2011 Exit[employer] Patient Information 2012 [employer]. 

## 2012-04-12 NOTE — Progress Notes (Signed)
  Subjective:    [name] Gibson is a 71 y.o. male who presents with left shoulder pain. The symptoms began 2 weeks ago. Aggravating factors: injury while lifting gate up at work. Pain is located in the - glenohumeral region. Discomfort is described as aching. Symptoms are exacerbated by repetitive movements and overhead movements. Evaluation to date: none. Therapy to date includes: nothing specific.  The following portions of the patient's history were reviewed and updated as appropriate: allergies, current medications, past family history, past medical history, past social history, past surgical history and problem list.  Review of Systems Pertinent items are noted in HPI.   Objective:    BP 148/86  Pulse 66  Temp(Src) 98.1 F (36.7 C) (Oral)  Wt 193 lb 3.2 oz (87.635 kg)  SpO2 98% Right shoulder: normal active ROM, no tenderness, no impingement sign  Left shoulder: full ROM, sensory exam normal, motor exam normal and pain in upper arm and sometimes ant shoulder--glenohumeral joing     Assessment:    Left shoulder pain  -- shoulder strain/sprain  Plan:    Natural history and expected course discussed. Questions answered. Reduction in offending activity. Gentle ROM exercises. Rest, ice, compression, and elevation (RICE) therapy.  Pt did not want pain meds because it normally does not hurt and when it does it lasts only a few minutes He has already been resting it I was not sure what pt wanted exactly but since he heard a popping sound and he does have pain---will refer to sports med for eval

## 2012-05-08 ENCOUNTER — Other Ambulatory Visit: Payer: Self-pay | Admitting: Cardiology

## 2012-05-08 ENCOUNTER — Other Ambulatory Visit: Payer: Self-pay | Admitting: Family Medicine

## 2012-05-10 ENCOUNTER — Other Ambulatory Visit: Payer: Self-pay | Admitting: Family Medicine

## 2012-06-18 ENCOUNTER — Other Ambulatory Visit: Payer: Self-pay | Admitting: Cardiology

## 2012-07-16 ENCOUNTER — Other Ambulatory Visit: Payer: Self-pay | Admitting: Cardiology

## 2012-08-14 ENCOUNTER — Other Ambulatory Visit: Payer: Self-pay | Admitting: Cardiology

## 2012-09-15 ENCOUNTER — Other Ambulatory Visit: Payer: Self-pay | Admitting: Cardiology

## 2012-09-22 ENCOUNTER — Other Ambulatory Visit: Payer: Self-pay | Admitting: Family Medicine

## 2012-09-22 DIAGNOSIS — I10 Essential (primary) hypertension: Secondary | ICD-10-CM

## 2012-09-22 NOTE — Telephone Encounter (Signed)
refill Amlodipine Besylate 10mg  Tablets #90 TK ONE T PO D last fill 10.11.13---laast ov 5.6.13 acute

## 2012-09-23 MED ORDER — AMLODIPINE BESYLATE 10 MG PO TABS: ORAL_TABLET | ORAL | Status: DC

## 2012-09-23 NOTE — Telephone Encounter (Signed)
Letter mailed to schedule a CPE.      KP 

## 2012-10-12 ENCOUNTER — Other Ambulatory Visit: Payer: Self-pay | Admitting: Cardiology

## 2012-10-20 ENCOUNTER — Telehealth: Payer: Self-pay | Admitting: Family Medicine

## 2012-10-20 DIAGNOSIS — I10 Essential (primary) hypertension: Secondary | ICD-10-CM

## 2012-10-20 MED ORDER — AMLODIPINE BESYLATE 10 MG PO TABS: ORAL_TABLET | ORAL | Status: DC

## 2012-10-20 NOTE — Telephone Encounter (Signed)
Early refill request from [facility] for: Amlodipine besylate 10 mg tablet. Tk 1 t po qd. Qty 90. Last fill 10-14-12

## 2012-11-14 ENCOUNTER — Other Ambulatory Visit: Payer: Self-pay | Admitting: Cardiology

## 2012-12-16 ENCOUNTER — Other Ambulatory Visit: Payer: Self-pay | Admitting: Cardiology

## 2012-12-16 ENCOUNTER — Other Ambulatory Visit: Payer: Self-pay | Admitting: Family Medicine

## 2012-12-17 NOTE — Telephone Encounter (Signed)
REFILL HYDROCHLORITHIAZIDE 12.5MG  CAPS #30 TAKE ONE CAPSULE BY MOUTH DAILY last fill 12.9.13--refused by cadiology NOTED send to PCP

## 2012-12-17 NOTE — Telephone Encounter (Signed)
This patient's  Rx has been refused by Cardiology. The patient is due for an OV. Please advise     KP

## 2013-01-10 ENCOUNTER — Telehealth: Payer: Self-pay | Admitting: Family Medicine

## 2013-01-10 DIAGNOSIS — I10 Essential (primary) hypertension: Secondary | ICD-10-CM

## 2013-01-10 MED ORDER — HYDROCHLOROTHIAZIDE 12.5 MG PO CAPS: 12.5000 mg | ORAL_CAPSULE | Freq: Every day | ORAL | Status: DC

## 2013-01-10 MED ORDER — METOPROLOL SUCCINATE ER 200 MG PO TB24: 200.0000 mg | ORAL_TABLET | Freq: Every day | ORAL | Status: DC

## 2013-01-10 MED ORDER — AMLODIPINE BESYLATE 10 MG PO TABS: ORAL_TABLET | ORAL | Status: DC

## 2013-01-10 NOTE — Telephone Encounter (Signed)
Patient scheduled cpe for 03/16/13 but will be out of meds before then. Can we refill to last him until his appointment? Amlodipine, metoprolol, and fluid pill. [facility] on [address]

## 2013-01-13 ENCOUNTER — Other Ambulatory Visit: Payer: Self-pay | Admitting: Cardiology

## 2013-01-13 ENCOUNTER — Other Ambulatory Visit: Payer: Self-pay | Admitting: Family Medicine

## 2013-03-14 ENCOUNTER — Other Ambulatory Visit: Payer: Self-pay | Admitting: Family Medicine

## 2013-03-16 ENCOUNTER — Ambulatory Visit (INDEPENDENT_AMBULATORY_CARE_PROVIDER_SITE_OTHER): Payer: Managed Care, Other (non HMO) | Admitting: Family Medicine

## 2013-03-16 ENCOUNTER — Encounter: Payer: Self-pay | Admitting: Family Medicine

## 2013-03-16 VITALS — BP 136/82 | HR 59 | Temp 98.1°F | Ht 65.25 in | Wt 193.8 lb

## 2013-03-16 DIAGNOSIS — Z0000 Encounter for general adult medical examination without abnormal findings: Secondary | ICD-10-CM

## 2013-03-16 DIAGNOSIS — Z8546 Personal history of malignant neoplasm of prostate: Secondary | ICD-10-CM

## 2013-03-16 DIAGNOSIS — I10 Essential (primary) hypertension: Secondary | ICD-10-CM

## 2013-03-16 DIAGNOSIS — R7309 Other abnormal glucose: Secondary | ICD-10-CM

## 2013-03-16 DIAGNOSIS — R739 Hyperglycemia, unspecified: Secondary | ICD-10-CM

## 2013-03-16 DIAGNOSIS — E785 Hyperlipidemia, unspecified: Secondary | ICD-10-CM

## 2013-03-16 LAB — CBC WITH DIFFERENTIAL/PLATELET
Basophils Absolute: 0 10*3/uL (ref 0.0–0.1)
Eosinophils Absolute: 0.1 10*3/uL (ref 0.0–0.7)
HCT: 43.2 % (ref 39.0–52.0)
Lymphs Abs: 1.3 10*3/uL (ref 0.7–4.0)
MCHC: 33.3 g/dL (ref 30.0–36.0)
MCV: 96.6 fl (ref 78.0–100.0)
Monocytes Absolute: 0.5 10*3/uL (ref 0.1–1.0)
Neutrophils Relative %: 46.4 % (ref 43.0–77.0)
Platelets: 173 10*3/uL (ref 150.0–400.0)
RDW: 14.3 % (ref 11.5–14.6)

## 2013-03-16 LAB — BASIC METABOLIC PANEL
BUN: 19 mg/dL (ref 6–23)
Chloride: 99 mEq/L (ref 96–112)
Creatinine, Ser: 0.9 mg/dL (ref 0.4–1.5)
Glucose, Bld: 101 mg/dL — ABNORMAL HIGH (ref 70–99)
Potassium: 3.5 mEq/L (ref 3.5–5.1)

## 2013-03-16 LAB — HEPATIC FUNCTION PANEL
ALT: 49 U/L (ref 0–53)
Bilirubin, Direct: 0 mg/dL (ref 0.0–0.3)
Total Bilirubin: 0.7 mg/dL (ref 0.3–1.2)

## 2013-03-16 LAB — LIPID PANEL
Cholesterol: 250 mg/dL — ABNORMAL HIGH (ref 0–200)
HDL: 54.9 mg/dL (ref >39.00)
Total CHOL/HDL Ratio: 5
Triglycerides: 90 mg/dL (ref 0.0–149.0)
VLDL: 18 mg/dL (ref 0.0–40.0)

## 2013-03-16 LAB — HEMOGLOBIN A1C: Hgb A1c MFr Bld: 6.1 % (ref 4.6–6.5)

## 2013-03-16 MED ORDER — AMLODIPINE BESYLATE 10 MG PO TABS: ORAL_TABLET | ORAL | Status: DC

## 2013-03-16 MED ORDER — HYDROCHLOROTHIAZIDE 12.5 MG PO CAPS: ORAL_CAPSULE | ORAL | Status: DC

## 2013-03-16 MED ORDER — METOPROLOL SUCCINATE ER 200 MG PO TB24: ORAL_TABLET | ORAL | Status: DC

## 2013-03-16 NOTE — Assessment & Plan Note (Signed)
Per u rology 

## 2013-03-16 NOTE — Assessment & Plan Note (Signed)
Check labs He is off meds

## 2013-03-16 NOTE — Progress Notes (Signed)
Subjective:    Patient ID: [name] Gibson, male    DOB: 1941/11/05, 72 y.o.   MRN: [id number]  HPI Pt here for cpe and labs.  No complaints.     Review of Systems    Review of Systems  Constitutional: Negative for activity change, appetite change and fatigue.  HENT: Negative for hearing loss, congestion, tinnitus and ear discharge.  dentist q66m Eyes: Negative for visual disturbance (see optho q1y -- vision corrected to 20/20 with glasses).  Respiratory: Negative for cough, chest tightness and shortness of breath.   Cardiovascular: Negative for chest pain, palpitations and leg swelling.  Gastrointestinal: Negative for abdominal pain, diarrhea, constipation and abdominal distention.  Genitourinary: Negative for urgency, frequency, decreased urine volume and difficulty urinating.  Musculoskeletal: Negative for back pain, arthralgias and gait problem.  Skin: Negative for color change, pallor and rash.  Neurological: Negative for dizziness, light-headedness, numbness and headaches.  Hematological: Negative for adenopathy. Does not bruise/bleed easily.  Psychiatric/Behavioral: Negative for suicidal ideas, confusion, sleep disturbance, self-injury, dysphoric mood, decreased concentration and agitation.     Past Medical History  Diagnosis Date  . Hyperlipidemia   . Hypertension   . Cancer     prostate   History   Social History  . Marital Status: Divorced    Spouse Name: N/A    Number of Children: N/A  . Years of Education: N/A   Occupational History  . Salesman    Social History Main Topics  . Smoking status: Never Smoker   . Smokeless tobacco: Not on file  . Alcohol Use: Yes  . Drug Use: No  . Sexually Active: Yes -- Male partner(s)   Other Topics Concern  . Not on file   Social History Narrative   Reg exercise--walking 2-3 x a week   Current Outpatient Prescriptions on File Prior to Visit  Medication Sig Dispense Refill  . bimatoprost (LUMIGAN) 0.03 % ophthalmic  solution 1 drop at bedtime.         No current facility-administered medications on file prior to visit.   Family History  Problem Relation Age of Onset  . Cancer Father     spine  . Cancer Sister     pancrease  . Diabetes Other   . Hyperlipidemia Other   . Hypertension Other   . Coronary artery disease Other     Objective:   Physical Exam BP 136/82  Pulse 59  Temp(Src) 98.1 F (36.7 C) (Oral)  Ht 5' 5.25" (1.657 m)  Wt 193 lb 12.8 oz (87.907 kg)  BMI 32.02 kg/m2  SpO2 98% General appearance: alert, cooperative, appears stated age and no distress Head: Normocephalic, without obvious abnormality, atraumatic Eyes: conjunctivae/corneas clear. PERRL, EOM's intact. Fundi benign. Ears: normal TM's and external ear canals both ears Nose: Nares normal. Septum midline. Mucosa normal. No drainage or sinus tenderness. Throat: lips, mucosa, and tongue normal; teeth and gums normal Neck: no adenopathy, no carotid bruit, no JVD, supple, symmetrical, trachea midline and thyroid not enlarged, symmetric, no tenderness/mass/nodules Back: symmetric, no curvature. ROM normal. No CVA tenderness. Lungs: clear to auscultation bilaterally Chest wall: no tenderness Heart: regular rate and rhythm, S1, S2 normal, no murmur, click, rub or gallop Abdomen: soft, non-tender; bowel sounds normal; no masses,  no organomegaly Male genitalia: normal, deferred--urology Rectal: deferred---urology Extremities: extremities normal, atraumatic, no cyanosis or edema Pulses: 2+ and symmetric Skin: Skin color, texture, turgor normal. No rashes or lesions Lymph nodes: Cervical, supraclavicular, and axillary nodes normal. Neurologic: Alert and oriented  X 3, normal strength and tone. Normal symmetric reflexes. Normal coordination and gait Psych-- no depression, no anxiety       Assessment & Plan:

## 2013-03-16 NOTE — Assessment & Plan Note (Signed)
Stable con't meds 

## 2013-03-16 NOTE — Patient Instructions (Addendum)
Preventive Care for Adults, Male A healthy lifestyle and preventive care can promote health and wellness. Preventive health guidelines for men include the following key practices:  A routine yearly physical is a good way to check with your caregiver about your health and preventative screening. It is a chance to share any concerns and updates on your health, and to receive a thorough exam.  Visit your dentist for a routine exam and preventative care every 6 months. Brush your teeth twice a day and floss once a day. Good oral hygiene prevents tooth decay and gum disease.  The frequency of eye exams is based on your age, health, family medical history, use of contact lenses, and other factors. Follow your caregiver's recommendations for frequency of eye exams.  Eat a healthy diet. Foods like vegetables, fruits, whole grains, low-fat dairy products, and lean protein foods contain the nutrients you need without too many calories. Decrease your intake of foods high in solid fats, added sugars, and salt. Eat the right amount of calories for you.Get information about a proper diet from your caregiver, if necessary.  Regular physical exercise is one of the most important things you can do for your health. Most adults should get at least 150 minutes of moderate-intensity exercise (any activity that increases your heart rate and causes you to sweat) each week. In addition, most adults need muscle-strengthening exercises on 2 or more days a week.  Maintain a healthy weight. The body mass index (BMI) is a screening tool to identify possible weight problems. It provides an estimate of body fat based on height and weight. Your caregiver can help determine your BMI, and can help you achieve or maintain a healthy weight.For adults 20 years and older:  A BMI below 18.5 is considered underweight.  A BMI of 18.5 to 24.9 is normal.  A BMI of 25 to 29.9 is considered overweight.  A BMI of 30 and above is  considered obese.  Maintain normal blood lipids and cholesterol levels by exercising and minimizing your intake of saturated fat. Eat a balanced diet with plenty of fruit and vegetables. Blood tests for lipids and cholesterol should begin at age 20 and be repeated every 5 years. If your lipid or cholesterol levels are high, you are over 50, or you are a high risk for heart disease, you may need your cholesterol levels checked more frequently.Ongoing high lipid and cholesterol levels should be treated with medicines if diet and exercise are not effective.  If you smoke, find out from your caregiver how to quit. If you do not use tobacco, do not start.  If you choose to drink alcohol, do not exceed 2 drinks per day. One drink is considered to be 12 ounces (355 mL) of beer, 5 ounces (148 mL) of wine, or 1.5 ounces (44 mL) of liquor.  Avoid use of street drugs. Do not share needles with anyone. Ask for help if you need support or instructions about stopping the use of drugs.  High blood pressure causes heart disease and increases the risk of stroke. Your blood pressure should be checked at least every 1 to 2 years. Ongoing high blood pressure should be treated with medicines, if weight loss and exercise are not effective.  If you are 45 to 74 years old, ask your caregiver if you should take aspirin to prevent heart disease.  Diabetes screening involves taking a blood sample to check your fasting blood sugar level. This should be done once every 3 years,   after age 45, if you are within normal weight and without risk factors for diabetes. Testing should be considered at a younger age or be carried out more frequently if you are overweight and have at least 1 risk factor for diabetes.  Colorectal cancer can be detected and often prevented. Most routine colorectal cancer screening begins at the age of 50 and continues through age 75. However, your caregiver may recommend screening at an earlier age if you  have risk factors for colon cancer. On a yearly basis, your caregiver may provide home test kits to check for hidden blood in the stool. Use of a small camera at the end of a tube, to directly examine the colon (sigmoidoscopy or colonoscopy), can detect the earliest forms of colorectal cancer. Talk to your caregiver about this at age 50, when routine screening begins. Direct examination of the colon should be repeated every 5 to 10 years through age 75, unless early forms of pre-cancerous polyps or small growths are found.  Hepatitis C blood testing is recommended for all people born from 1945 through 1965 and any individual with known risks for hepatitis C.  Practice safe sex. Use condoms and avoid high-risk sexual practices to reduce the spread of sexually transmitted infections (STIs). STIs include gonorrhea, chlamydia, syphilis, trichomonas, herpes, HPV, and human immunodeficiency virus (HIV). Herpes, HIV, and HPV are viral illnesses that have no cure. They can result in disability, cancer, and death.  A one-time screening for abdominal aortic aneurysm (AAA) and surgical repair of large AAAs by sound wave imaging (ultrasonography) is recommended for ages 65 to 75 years who are current or former smokers.  Healthy men should no longer receive prostate-specific antigen (PSA) blood tests as part of routine cancer screening. Consult with your caregiver about prostate cancer screening.  Testicular cancer screening is not recommended for adult males who have no symptoms. Screening includes self-exam, caregiver exam, and other screening tests. Consult with your caregiver about any symptoms you have or any concerns you have about testicular cancer.  Use sunscreen with skin protection factor (SPF) of 30 or more. Apply sunscreen liberally and repeatedly throughout the day. You should seek shade when your shadow is shorter than you. Protect yourself by wearing long sleeves, pants, a wide-brimmed hat, and  sunglasses year round, whenever you are outdoors.  Once a month, do a whole body skin exam, using a mirror to look at the skin on your back. Notify your caregiver of new moles, moles that have irregular borders, moles that are larger than a pencil eraser, or moles that have changed in shape or color.  Stay current with required immunizations.  Influenza. You need a dose every fall (or winter). The composition of the flu vaccine changes each year, so being vaccinated once is not enough.  Pneumococcal polysaccharide. You need 1 to 2 doses if you smoke cigarettes or if you have certain chronic medical conditions. You need 1 dose at age 65 (or older) if you have never been vaccinated.  Tetanus, diphtheria, pertussis (Tdap, Td). Get 1 dose of Tdap vaccine if you are younger than age 65 years, are over 65 and have contact with an infant, are a healthcare worker, or simply want to be protected from whooping cough. After that, you need a Td booster dose every 10 years. Consult your caregiver if you have not had at least 3 tetanus and diphtheria-containing shots sometime in your life or have a deep or dirty wound.  HPV. This vaccine is recommended   for males 13 through 74 years of age. This vaccine may be given to men 22 through 74 years of age who have not completed the 3 dose series. It is recommended for men through age 26 who have sex with men or whose immune system is weakened because of HIV infection, other illness, or medications. The vaccine is given in 3 doses over 6 months.  Measles, mumps, rubella (MMR). You need at least 1 dose of MMR if you were born in 1957 or later. You may also need a 2nd dose.  Meningococcal. If you are age 19 to 21 years and a first-year college student living in a residence hall, or have one of several medical conditions, you need to get vaccinated against meningococcal disease. You may also need additional booster doses.  Zoster (shingles). If you are age 60 years or  older, you should get this vaccine.  Varicella (chickenpox). If you have never had chickenpox or you were vaccinated but received only 1 dose, talk to your caregiver to find out if you need this vaccine.  Hepatitis A. You need this vaccine if you have a specific risk factor for hepatitis A virus infection, or you simply wish to be protected from this disease. The vaccine is usually given as 2 doses, 6 to 18 months apart.  Hepatitis B. You need this vaccine if you have a specific risk factor for hepatitis B virus infection or you simply wish to be protected from this disease. The vaccine is given in 3 doses, usually over 6 months. Preventative Service / Frequency Ages 19 to 39  Blood pressure check.** / Every 1 to 2 years.  Lipid and cholesterol check.** / Every 5 years beginning at age 20.  Hepatitis C blood test.** / For any individual with known risks for hepatitis C.  Skin self-exam. / Monthly.  Influenza immunization.** / Every year.  Pneumococcal polysaccharide immunization.** / 1 to 2 doses if you smoke cigarettes or if you have certain chronic medical conditions.  Tetanus, diphtheria, pertussis (Tdap,Td) immunization. / A one-time dose of Tdap vaccine. After that, you need a Td booster dose every 10 years.  HPV immunization. / 3 doses over 6 months, if 26 and younger.  Measles, mumps, rubella (MMR) immunization. / You need at least 1 dose of MMR if you were born in 1957 or later. You may also need a 2nd dose.  Meningococcal immunization. / 1 dose if you are age 19 to 21 years and a first-year college student living in a residence hall, or have one of several medical conditions, you need to get vaccinated against meningococcal disease. You may also need additional booster doses.  Varicella immunization.** / Consult your caregiver.  Hepatitis A immunization.** / Consult your caregiver. 2 doses, 6 to 18 months apart.  Hepatitis B immunization.** / Consult your caregiver. 3 doses  usually over 6 months. Ages 40 to 64  Blood pressure check.** / Every 1 to 2 years.  Lipid and cholesterol check.** / Every 5 years beginning at age 20.  Fecal occult blood test (FOBT) of stool. / Every year beginning at age 50 and continuing until age 75. You may not have to do this test if you get colonoscopy every 10 years.  Flexible sigmoidoscopy** or colonoscopy.** / Every 5 years for a flexible sigmoidoscopy or every 10 years for a colonoscopy beginning at age 50 and continuing until age 75.  Hepatitis C blood test.** / For all people born from 1945 through 1965 and any   individual with known risks for hepatitis C.  Skin self-exam. / Monthly.  Influenza immunization.** / Every year.  Pneumococcal polysaccharide immunization.** / 1 to 2 doses if you smoke cigarettes or if you have certain chronic medical conditions.  Tetanus, diphtheria, pertussis (Tdap/Td) immunization.** / A one-time dose of Tdap vaccine. After that, you need a Td booster dose every 10 years.  Measles, mumps, rubella (MMR) immunization. / You need at least 1 dose of MMR if you were born in 1957 or later. You may also need a 2nd dose.  Varicella immunization.**/ Consult your caregiver.  Meningococcal immunization.** / Consult your caregiver.  Hepatitis A immunization.** / Consult your caregiver. 2 doses, 6 to 18 months apart.  Hepatitis B immunization.** / Consult your caregiver. 3 doses, usually over 6 months. Ages 65 and over  Blood pressure check.** / Every 1 to 2 years.  Lipid and cholesterol check.**/ Every 5 years beginning at age 20.  Fecal occult blood test (FOBT) of stool. / Every year beginning at age 50 and continuing until age 75. You may not have to do this test if you get colonoscopy every 10 years.  Flexible sigmoidoscopy** or colonoscopy.** / Every 5 years for a flexible sigmoidoscopy or every 10 years for a colonoscopy beginning at age 50 and continuing until age 75.  Hepatitis C blood  test.** / For all people born from 1945 through 1965 and any individual with known risks for hepatitis C.  Abdominal aortic aneurysm (AAA) screening.** / A one-time screening for ages 65 to 75 years who are current or former smokers.  Skin self-exam. / Monthly.  Influenza immunization.** / Every year.  Pneumococcal polysaccharide immunization.** / 1 dose at age 65 (or older) if you have never been vaccinated.  Tetanus, diphtheria, pertussis (Tdap, Td) immunization. / A one-time dose of Tdap vaccine if you are over 65 and have contact with an infant, are a healthcare worker, or simply want to be protected from whooping cough. After that, you need a Td booster dose every 10 years.  Varicella immunization. ** / Consult your caregiver.  Meningococcal immunization.** / Consult your caregiver.  Hepatitis A immunization. ** / Consult your caregiver. 2 doses, 6 to 18 months apart.  Hepatitis B immunization.** / Check with your caregiver. 3 doses, usually over 6 months. **Family history and personal history of risk and conditions may change your caregiver's recommendations. Document Released: 01/20/2002 Document Revised: 02/16/2012 Document Reviewed: 04/21/2011 [employer] Patient Information 2013 [employer].  

## 2013-03-23 ENCOUNTER — Telehealth: Payer: Self-pay | Admitting: Family Medicine

## 2013-03-23 NOTE — Progress Notes (Signed)
Quick Note:  Called the patient at [phone] [facility]) and the line was busy KP ______

## 2013-03-23 NOTE — Telephone Encounter (Signed)
Rx sent 03/16/13 #90 with 3 refills.

## 2013-03-23 NOTE — Telephone Encounter (Signed)
HYDROCHLOROTHIAZIDE 12.5 MG CAPSULES QTY: 30 LAST REFILL: 4.9.14 TK ONE C PO D

## 2013-03-31 DIAGNOSIS — I10 Essential (primary) hypertension: Secondary | ICD-10-CM

## 2013-03-31 MED ORDER — ATORVASTATIN CALCIUM 40 MG PO TABS: ORAL_TABLET | ORAL | Status: DC

## 2013-04-12 ENCOUNTER — Other Ambulatory Visit: Payer: Self-pay | Admitting: Family Medicine

## 2013-11-09 ENCOUNTER — Other Ambulatory Visit: Payer: Self-pay | Admitting: Family Medicine

## 2013-11-24 ENCOUNTER — Other Ambulatory Visit: Payer: Self-pay | Admitting: Family Medicine

## 2013-12-07 ENCOUNTER — Encounter: Payer: Self-pay | Admitting: Family Medicine

## 2013-12-07 ENCOUNTER — Ambulatory Visit (INDEPENDENT_AMBULATORY_CARE_PROVIDER_SITE_OTHER): Payer: Managed Care, Other (non HMO) | Admitting: Family Medicine

## 2013-12-07 VITALS — BP 136/76 | HR 64 | Temp 98.1°F | Wt 191.6 lb

## 2013-12-07 DIAGNOSIS — Z862 Personal history of diseases of the blood and blood-forming organs and certain disorders involving the immune mechanism: Secondary | ICD-10-CM

## 2013-12-07 DIAGNOSIS — I10 Essential (primary) hypertension: Secondary | ICD-10-CM

## 2013-12-07 DIAGNOSIS — E669 Obesity, unspecified: Secondary | ICD-10-CM | POA: Insufficient documentation

## 2013-12-07 DIAGNOSIS — Z23 Encounter for immunization: Secondary | ICD-10-CM

## 2013-12-07 DIAGNOSIS — Z8639 Personal history of other endocrine, nutritional and metabolic disease: Secondary | ICD-10-CM

## 2013-12-07 DIAGNOSIS — E785 Hyperlipidemia, unspecified: Secondary | ICD-10-CM

## 2013-12-07 HISTORY — DX: Obesity, unspecified: E66.9

## 2013-12-07 LAB — LIPID PANEL
Cholesterol: 140 mg/dL (ref 0–200)
LDL Cholesterol: 67 mg/dL (ref 0–99)
Triglycerides: 65 mg/dL (ref 0.0–149.0)
VLDL: 13 mg/dL (ref 0.0–40.0)

## 2013-12-07 LAB — BASIC METABOLIC PANEL
BUN: 16 mg/dL (ref 6–23)
Creatinine, Ser: 0.8 mg/dL (ref 0.4–1.5)
GFR: 124.97 mL/min (ref >60.00)
Glucose, Bld: 108 mg/dL — ABNORMAL HIGH (ref 70–99)

## 2013-12-07 LAB — HEPATIC FUNCTION PANEL
Albumin: 4.4 g/dL (ref 3.5–5.2)
Total Bilirubin: 0.7 mg/dL (ref 0.3–1.2)

## 2013-12-07 MED ORDER — METOPROLOL SUCCINATE ER 200 MG PO TB24: ORAL_TABLET | ORAL | Status: DC

## 2013-12-07 MED ORDER — AMLODIPINE BESYLATE 10 MG PO TABS: ORAL_TABLET | ORAL | Status: DC

## 2013-12-07 MED ORDER — HYDROCHLOROTHIAZIDE 12.5 MG PO CAPS: ORAL_CAPSULE | ORAL | Status: DC

## 2013-12-07 NOTE — Assessment & Plan Note (Signed)
Check labs con't meds 

## 2013-12-07 NOTE — Progress Notes (Signed)
Pre visit review using our clinic review tool, if applicable. No additional management support is needed unless otherwise documented below in the visit note. 

## 2013-12-07 NOTE — Progress Notes (Signed)
  Subjective:    Patient here for follow-up of elevated blood pressure.  He is exercising and is adherent to a low-salt diet.  Blood pressure is well controlled at home. Cardiac symptoms: none. Patient denies: chest pain, chest pressure/discomfort, claudication, dyspnea, exertional chest pressure/discomfort, fatigue, irregular heart beat, lower extremity edema, near-syncope, orthopnea, palpitations, paroxysmal nocturnal dyspnea, syncope and tachypnea. Cardiovascular risk factors: advanced age (older than 10 for men, 86 for women), dyslipidemia, hypertension, male gender and sedentary lifestyle. Use of agents associated with hypertension: none. History of target organ damage: none.  The following portions of the patient's history were reviewed and updated as appropriate: allergies, current medications, past family history, past medical history, past social history, past surgical history and problem list.  Review of Systems Pertinent items are noted in HPI.     Objective:    BP 136/76  Pulse 64  Temp(Src) 98.1 F (36.7 C) (Oral)  Wt 191 lb 9.6 oz (86.909 kg)  SpO2 97% General appearance: alert, cooperative, appears stated age and no distress Neck: no adenopathy, supple, symmetrical, trachea midline and thyroid not enlarged, symmetric, no tenderness/mass/nodules Lungs: clear to auscultation bilaterally Heart: S1, S2 normal Extremities: extremities normal, atraumatic, no cyanosis or edema    Assessment:    Hypertension, normal blood pressure . Evidence of target organ damage: none.    Plan:    Medication: no change. Dietary sodium restriction. Regular aerobic exercise. Check blood pressures 2-3 times weekly and record. Follow up: 6 months and as needed.

## 2013-12-07 NOTE — Patient Instructions (Signed)

## 2013-12-09 LAB — TSH: TSH: 1.99 u[IU]/mL (ref 0.35–5.50)

## 2013-12-26 ENCOUNTER — Other Ambulatory Visit: Payer: Self-pay | Admitting: Family Medicine

## 2014-02-08 ENCOUNTER — Ambulatory Visit: Payer: Managed Care, Other (non HMO) | Admitting: Family Medicine

## 2014-02-15 ENCOUNTER — Ambulatory Visit: Payer: Managed Care, Other (non HMO) | Admitting: Family Medicine

## 2014-06-27 ENCOUNTER — Other Ambulatory Visit: Payer: Self-pay | Admitting: Family Medicine

## 2014-06-27 NOTE — Telephone Encounter (Signed)
Refill Request:  atorvastatin (LIPITOR) 40 MG tablet--TAKE 1 TABLET BY MOUTH AT BEDTIME  Last Filled:  12/26/13 Amt Filled:  30 tablets, 5 refills Last OV:  12/07/13 Labs: 12/07/13  Med filled per Prescription Refill Protocol.  Called and left a message making patient aware that prescription has been filled and that a office visit/labs are due.

## 2014-07-28 ENCOUNTER — Other Ambulatory Visit: Payer: Self-pay | Admitting: Family Medicine

## 2014-08-27 ENCOUNTER — Other Ambulatory Visit: Payer: Self-pay | Admitting: Family Medicine

## 2014-09-13 ENCOUNTER — Other Ambulatory Visit: Payer: Self-pay | Admitting: Family Medicine

## 2014-10-04 ENCOUNTER — Encounter: Payer: Managed Care, Other (non HMO) | Admitting: Family Medicine

## 2014-12-06 ENCOUNTER — Other Ambulatory Visit: Payer: Self-pay | Admitting: Family Medicine

## 2014-12-14 ENCOUNTER — Encounter: Payer: Self-pay | Admitting: Family Medicine

## 2014-12-14 ENCOUNTER — Ambulatory Visit (INDEPENDENT_AMBULATORY_CARE_PROVIDER_SITE_OTHER): Payer: Medicare Other | Admitting: Family Medicine

## 2014-12-14 VITALS — BP 135/75 | HR 84 | Temp 98.2°F | Ht 65.0 in | Wt 187.0 lb

## 2014-12-14 DIAGNOSIS — E785 Hyperlipidemia, unspecified: Secondary | ICD-10-CM

## 2014-12-14 DIAGNOSIS — I10 Essential (primary) hypertension: Secondary | ICD-10-CM | POA: Diagnosis not present

## 2014-12-14 DIAGNOSIS — E0590 Thyrotoxicosis, unspecified without thyrotoxic crisis or storm: Secondary | ICD-10-CM | POA: Diagnosis not present

## 2014-12-14 DIAGNOSIS — Z0000 Encounter for general adult medical examination without abnormal findings: Secondary | ICD-10-CM | POA: Diagnosis not present

## 2014-12-14 DIAGNOSIS — Z8546 Personal history of malignant neoplasm of prostate: Secondary | ICD-10-CM

## 2014-12-14 DIAGNOSIS — Z23 Encounter for immunization: Secondary | ICD-10-CM

## 2014-12-14 LAB — CBC WITH DIFFERENTIAL/PLATELET
BASOS ABS: 0 10*3/uL (ref 0.0–0.1)
Basophils Relative: 0.8 % (ref 0.0–3.0)
EOS ABS: 0 10*3/uL (ref 0.0–0.7)
Eosinophils Relative: 0.9 % (ref 0.0–5.0)
HCT: 45.3 % (ref 39.0–52.0)
Hemoglobin: 14.9 g/dL (ref 13.0–17.0)
LYMPHS ABS: 1.6 10*3/uL (ref 0.7–4.0)
Lymphocytes Relative: 30 % (ref 12.0–46.0)
MCHC: 32.9 g/dL (ref 30.0–36.0)
MCV: 96.3 fl (ref 78.0–100.0)
MONOS PCT: 11.2 % (ref 3.0–12.0)
Monocytes Absolute: 0.6 10*3/uL (ref 0.1–1.0)
Neutro Abs: 3 10*3/uL (ref 1.4–7.7)
Neutrophils Relative %: 57.1 % (ref 43.0–77.0)
PLATELETS: 215 10*3/uL (ref 150.0–400.0)
RBC: 4.7 Mil/uL (ref 4.22–5.81)
RDW: 14.6 % (ref 11.5–15.5)
WBC: 5.3 10*3/uL (ref 4.0–10.5)

## 2014-12-14 LAB — POCT URINALYSIS DIPSTICK
Bilirubin, UA: NEGATIVE
Blood, UA: NEGATIVE
GLUCOSE UA: NEGATIVE
Ketones, UA: NEGATIVE
Leukocytes, UA: NEGATIVE
NITRITE UA: NEGATIVE
PROTEIN UA: NEGATIVE
UROBILINOGEN UA: 0.2
pH, UA: 6

## 2014-12-14 LAB — HEPATIC FUNCTION PANEL
ALT: 52 U/L (ref 0–53)
AST: 39 U/L — ABNORMAL HIGH (ref 0–37)
Albumin: 4.5 g/dL (ref 3.5–5.2)
Alkaline Phosphatase: 53 U/L (ref 39–117)
Bilirubin, Direct: 0.2 mg/dL (ref 0.0–0.3)
Total Bilirubin: 0.7 mg/dL (ref 0.2–1.2)
Total Protein: 7.6 g/dL (ref 6.0–8.3)

## 2014-12-14 LAB — LIPID PANEL
CHOL/HDL RATIO: 4
Cholesterol: 172 mg/dL (ref 0–200)
HDL: 46.3 mg/dL (ref >39.00)
LDL Cholesterol: 110 mg/dL — ABNORMAL HIGH (ref 0–99)
NONHDL: 125.7
Triglycerides: 81 mg/dL (ref 0.0–149.0)
VLDL: 16.2 mg/dL (ref 0.0–40.0)

## 2014-12-14 LAB — BASIC METABOLIC PANEL
BUN: 16 mg/dL (ref 6–23)
CO2: 27 meq/L (ref 19–32)
Calcium: 9.8 mg/dL (ref 8.4–10.5)
Chloride: 100 mEq/L (ref 96–112)
Creatinine, Ser: 1.1 mg/dL (ref 0.4–1.5)
GFR: 81.25 mL/min (ref >60.00)
Glucose, Bld: 93 mg/dL (ref 70–99)
Potassium: 3.8 mEq/L (ref 3.5–5.1)
Sodium: 137 mEq/L (ref 135–145)

## 2014-12-14 LAB — MICROALBUMIN / CREATININE URINE RATIO
CREATININE, U: 71.2 mg/dL
Microalb Creat Ratio: 0.8 mg/g (ref 0.0–30.0)
Microalb, Ur: 0.6 mg/dL (ref 0.0–1.9)

## 2014-12-14 LAB — TSH: TSH: 2.28 u[IU]/mL (ref 0.35–4.50)

## 2014-12-14 LAB — PSA: PSA: 0.16 ng/mL (ref 0.10–4.00)

## 2014-12-14 MED ORDER — ZOSTER VACCINE LIVE 19400 UNT/0.65ML ~~LOC~~ SOLR: 0.6500 mL | Freq: Once | SUBCUTANEOUS | Status: DC

## 2014-12-14 NOTE — Patient Instructions (Signed)
Preventive Care for Adults A healthy lifestyle and preventive care can promote health and wellness. Preventive health guidelines for men include the following key practices:  A routine yearly physical is a good way to check with your health care provider about your health and preventative screening. It is a chance to share any concerns and updates on your health and to receive a thorough exam.  Visit your dentist for a routine exam and preventative care every 6 months. Brush your teeth twice a day and floss once a day. Good oral hygiene prevents tooth decay and gum disease.  The frequency of eye exams is based on your age, health, family medical history, use of contact lenses, and other factors. Follow your health care provider's recommendations for frequency of eye exams.  Eat a healthy diet. Foods such as vegetables, fruits, whole grains, low-fat dairy products, and lean protein foods contain the nutrients you need without too many calories. Decrease your intake of foods high in solid fats, added sugars, and salt. Eat the right amount of calories for you.Get information about a proper diet from your health care provider, if necessary.  Regular physical exercise is one of the most important things you can do for your health. Most adults should get at least 150 minutes of moderate-intensity exercise (any activity that increases your heart rate and causes you to sweat) each week. In addition, most adults need muscle-strengthening exercises on 2 or more days a week.  Maintain a healthy weight. The body mass index (BMI) is a screening tool to identify possible weight problems. It provides an estimate of body fat based on height and weight. Your health care provider can find your BMI and can help you achieve or maintain a healthy weight.For adults 20 years and older:  A BMI below 18.5 is considered underweight.  A BMI of 18.5 to 24.9 is normal.  A BMI of 25 to 29.9 is considered overweight.  A BMI  of 30 and above is considered obese.  Maintain normal blood lipids and cholesterol levels by exercising and minimizing your intake of saturated fat. Eat a balanced diet with plenty of fruit and vegetables. Blood tests for lipids and cholesterol should begin at age 50 and be repeated every 5 years. If your lipid or cholesterol levels are high, you are over 50, or you are at high risk for heart disease, you may need your cholesterol levels checked more frequently.Ongoing high lipid and cholesterol levels should be treated with medicines if diet and exercise are not working.  If you smoke, find out from your health care provider how to quit. If you do not use tobacco, do not start.  Lung cancer screening is recommended for adults aged 73-80 years who are at high risk for developing lung cancer because of a history of smoking. A yearly low-dose CT scan of the lungs is recommended for people who have at least a 30-pack-year history of smoking and are a current smoker or have quit within the past 15 years. A pack year of smoking is smoking an average of 1 pack of cigarettes a day for 1 year (for example: 1 pack a day for 30 years or 2 packs a day for 15 years). Yearly screening should continue until the smoker has stopped smoking for at least 15 years. Yearly screening should be stopped for people who develop a health problem that would prevent them from having lung cancer treatment.  If you choose to drink alcohol, do not have more than  2 drinks per day. One drink is considered to be 12 ounces (355 mL) of beer, 5 ounces (148 mL) of wine, or 1.5 ounces (44 mL) of liquor.  Avoid use of street drugs. Do not share needles with anyone. Ask for help if you need support or instructions about stopping the use of drugs.  High blood pressure causes heart disease and increases the risk of stroke. Your blood pressure should be checked at least every 1-2 years. Ongoing high blood pressure should be treated with  medicines, if weight loss and exercise are not effective.  If you are 45-79 years old, ask your health care provider if you should take aspirin to prevent heart disease.  Diabetes screening involves taking a blood sample to check your fasting blood sugar level. This should be done once every 3 years, after age 45, if you are within normal weight and without risk factors for diabetes. Testing should be considered at a younger age or be carried out more frequently if you are overweight and have at least 1 risk factor for diabetes.  Colorectal cancer can be detected and often prevented. Most routine colorectal cancer screening begins at the age of 50 and continues through age 75. However, your health care provider may recommend screening at an earlier age if you have risk factors for colon cancer. On a yearly basis, your health care provider may provide home test kits to check for hidden blood in the stool. Use of a small camera at the end of a tube to directly examine the colon (sigmoidoscopy or colonoscopy) can detect the earliest forms of colorectal cancer. Talk to your health care provider about this at age 50, when routine screening begins. Direct exam of the colon should be repeated every 5-10 years through age 75, unless early forms of precancerous polyps or small growths are found.  People who are at an increased risk for hepatitis B should be screened for this virus. You are considered at high risk for hepatitis B if:  You were born in a country where hepatitis B occurs often. Talk with your health care provider about which countries are considered high risk.  Your parents were born in a high-risk country and you have not received a shot to protect against hepatitis B (hepatitis B vaccine).  You have HIV or AIDS.  You use needles to inject street drugs.  You live with, or have sex with, someone who has hepatitis B.  You are a man who has sex with other men (MSM).  You get hemodialysis  treatment.  You take certain medicines for conditions such as cancer, organ transplantation, and autoimmune conditions.  Hepatitis C blood testing is recommended for all people born from 1945 through 1965 and any individual with known risks for hepatitis C.  Practice safe sex. Use condoms and avoid high-risk sexual practices to reduce the spread of sexually transmitted infections (STIs). STIs include gonorrhea, chlamydia, syphilis, trichomonas, herpes, HPV, and human immunodeficiency virus (HIV). Herpes, HIV, and HPV are viral illnesses that have no cure. They can result in disability, cancer, and death.  If you are at risk of being infected with HIV, it is recommended that you take a prescription medicine daily to prevent HIV infection. This is called preexposure prophylaxis (PrEP). You are considered at risk if:  You are a man who has sex with other men (MSM) and have other risk factors.  You are a heterosexual man, are sexually active, and are at increased risk for HIV infection.    You take drugs by injection.  You are sexually active with a partner who has HIV.  Talk with your health care provider about whether you are at high risk of being infected with HIV. If you choose to begin PrEP, you should first be tested for HIV. You should then be tested every 3 months for as long as you are taking PrEP.  A one-time screening for abdominal aortic aneurysm (AAA) and surgical repair of large AAAs by ultrasound are recommended for men ages 32 to 67 years who are current or former smokers.  Healthy men should no longer receive prostate-specific antigen (PSA) blood tests as part of routine cancer screening. Talk with your health care provider about prostate cancer screening.  Testicular cancer screening is not recommended for adult males who have no symptoms. Screening includes self-exam, a health care provider exam, and other screening tests. Consult with your health care provider about any symptoms  you have or any concerns you have about testicular cancer.  Use sunscreen. Apply sunscreen liberally and repeatedly throughout the day. You should seek shade when your shadow is shorter than you. Protect yourself by wearing long sleeves, pants, a wide-brimmed hat, and sunglasses year round, whenever you are outdoors.  Once a month, do a whole-body skin exam, using a mirror to look at the skin on your back. Tell your health care provider about new moles, moles that have irregular borders, moles that are larger than a pencil eraser, or moles that have changed in shape or color.  Stay current with required vaccines (immunizations).  Influenza vaccine. All adults should be immunized every year.  Tetanus, diphtheria, and acellular pertussis (Td, Tdap) vaccine. An adult who has not previously received Tdap or who does not know his vaccine status should receive 1 dose of Tdap. This initial dose should be followed by tetanus and diphtheria toxoids (Td) booster doses every 10 years. Adults with an unknown or incomplete history of completing a 3-dose immunization series with Td-containing vaccines should begin or complete a primary immunization series including a Tdap dose. Adults should receive a Td booster every 10 years.  Varicella vaccine. An adult without evidence of immunity to varicella should receive 2 doses or a second dose if he has previously received 1 dose.  Human papillomavirus (HPV) vaccine. Males aged 68-21 years who have not received the vaccine previously should receive the 3-dose series. Males aged 22-26 years may be immunized. Immunization is recommended through the age of 6 years for any male who has sex with males and did not get any or all doses earlier. Immunization is recommended for any person with an immunocompromised condition through the age of 49 years if he did not get any or all doses earlier. During the 3-dose series, the second dose should be obtained 4-8 weeks after the first  dose. The third dose should be obtained 24 weeks after the first dose and 16 weeks after the second dose.  Zoster vaccine. One dose is recommended for adults aged 50 years or older unless certain conditions are present.  Measles, mumps, and rubella (MMR) vaccine. Adults born before [birth year] generally are considered immune to measles and mumps. Adults born in [birth year] or later should have 1 or more doses of MMR vaccine unless there is a contraindication to the vaccine or there is laboratory evidence of immunity to each of the three diseases. A routine second dose of MMR vaccine should be obtained at least 28 days after the first dose for students attending postsecondary  schools, health care workers, or international travelers. People who received inactivated measles vaccine or an unknown type of measles vaccine during 1963-1967 should receive 2 doses of MMR vaccine. People who received inactivated mumps vaccine or an unknown type of mumps vaccine before 1979 and are at high risk for mumps infection should consider immunization with 2 doses of MMR vaccine. Unvaccinated health care workers born before 1957 who lack laboratory evidence of measles, mumps, or rubella immunity or laboratory confirmation of disease should consider measles and mumps immunization with 2 doses of MMR vaccine or rubella immunization with 1 dose of MMR vaccine.  Pneumococcal 13-valent conjugate (PCV13) vaccine. When indicated, a person who is uncertain of his immunization history and has no record of immunization should receive the PCV13 vaccine. An adult aged 19 years or older who has certain medical conditions and has not been previously immunized should receive 1 dose of PCV13 vaccine. This PCV13 should be followed with a dose of pneumococcal polysaccharide (PPSV23) vaccine. The PPSV23 vaccine dose should be obtained at least 8 weeks after the dose of PCV13 vaccine. An adult aged 19 years or older who has certain medical conditions and  previously received 1 or more doses of PPSV23 vaccine should receive 1 dose of PCV13. The PCV13 vaccine dose should be obtained 1 or more years after the last PPSV23 vaccine dose.  Pneumococcal polysaccharide (PPSV23) vaccine. When PCV13 is also indicated, PCV13 should be obtained first. All adults aged 65 years and older should be immunized. An adult younger than age 65 years who has certain medical conditions should be immunized. Any person who resides in a nursing home or long-term care facility should be immunized. An adult smoker should be immunized. People with an immunocompromised condition and certain other conditions should receive both PCV13 and PPSV23 vaccines. People with human immunodeficiency virus (HIV) infection should be immunized as soon as possible after diagnosis. Immunization during chemotherapy or radiation therapy should be avoided. Routine use of PPSV23 vaccine is not recommended for American Indians, [place] Natives, or people younger than 65 years unless there are medical conditions that require PPSV23 vaccine. When indicated, people who have unknown immunization and have no record of immunization should receive PPSV23 vaccine. One-time revaccination 5 years after the first dose of PPSV23 is recommended for people aged 19-64 years who have chronic kidney failure, nephrotic syndrome, asplenia, or immunocompromised conditions. People who received 1-2 doses of PPSV23 before age 65 years should receive another dose of PPSV23 vaccine at age 65 years or later if at least 5 years have passed since the previous dose. Doses of PPSV23 are not needed for people immunized with PPSV23 at or after age 65 years.  Meningococcal vaccine. Adults with asplenia or persistent complement component deficiencies should receive 2 doses of quadrivalent meningococcal conjugate (MenACWY-D) vaccine. The doses should be obtained at least 2 months apart. Microbiologists working with certain meningococcal bacteria,  military recruits, people at risk during an outbreak, and people who travel to or live in countries with a high rate of meningitis should be immunized. A first-year college student up through age 21 years who is living in a residence hall should receive a dose if he did not receive a dose on or after his 16th birthday. Adults who have certain high-risk conditions should receive one or more doses of vaccine.  Hepatitis A vaccine. Adults who wish to be protected from this disease, have certain high-risk conditions, work with hepatitis A-infected animals, work in hepatitis A research labs, or   travel to or work in countries with a high rate of hepatitis A should be immunized. Adults who were previously unvaccinated and who anticipate close contact with an international adoptee during the first 60 days after arrival in the [place] from a country with a high rate of hepatitis A should be immunized.  Hepatitis B vaccine. Adults should be immunized if they wish to be protected from this disease, have certain high-risk conditions, may be exposed to blood or other infectious body fluids, are household contacts or sex partners of hepatitis B positive people, are clients or workers in certain care facilities, or travel to or work in countries with a high rate of hepatitis B.  Haemophilus influenzae type b (Hib) vaccine. A previously unvaccinated person with asplenia or sickle cell disease or having a scheduled splenectomy should receive 1 dose of Hib vaccine. Regardless of previous immunization, a recipient of a hematopoietic stem cell transplant should receive a 3-dose series 6-12 months after his successful transplant. Hib vaccine is not recommended for adults with HIV infection. Preventive Service / Frequency Ages 52 to 17  Blood pressure check.** / Every 1 to 2 years.  Lipid and cholesterol check.** / Every 5 years beginning at age 69.  Hepatitis C blood test.** / For any individual with known risks for  hepatitis C.  Skin self-exam. / Monthly.  Influenza vaccine. / Every year.  Tetanus, diphtheria, and acellular pertussis (Tdap, Td) vaccine.** / Consult your health care provider. 1 dose of Td every 10 years.  Varicella vaccine.** / Consult your health care provider.  HPV vaccine. / 3 doses over 6 months, if 72 or younger.  Measles, mumps, rubella (MMR) vaccine.** / You need at least 1 dose of MMR if you were born in 1957 or later. You may also need a second dose.  Pneumococcal 13-valent conjugate (PCV13) vaccine.** / Consult your health care provider.  Pneumococcal polysaccharide (PPSV23) vaccine.** / 1 to 2 doses if you smoke cigarettes or if you have certain conditions.  Meningococcal vaccine.** / 1 dose if you are age 35 to 60 years and a Market researcher living in a residence hall, or have one of several medical conditions. You may also need additional booster doses.  Hepatitis A vaccine.** / Consult your health care provider.  Hepatitis B vaccine.** / Consult your health care provider.  Haemophilus influenzae type b (Hib) vaccine.** / Consult your health care provider. Ages 35 to 8  Blood pressure check.** / Every 1 to 2 years.  Lipid and cholesterol check.** / Every 5 years beginning at age 57.  Lung cancer screening. / Every year if you are aged 44-80 years and have a 30-pack-year history of smoking and currently smoke or have quit within the past 15 years. Yearly screening is stopped once you have quit smoking for at least 15 years or develop a health problem that would prevent you from having lung cancer treatment.  Fecal occult blood test (FOBT) of stool. / Every year beginning at age 55 and continuing until age 73. You may not have to do this test if you get a colonoscopy every 10 years.  Flexible sigmoidoscopy** or colonoscopy.** / Every 5 years for a flexible sigmoidoscopy or every 10 years for a colonoscopy beginning at age 28 and continuing until age  1.  Hepatitis C blood test.** / For all people born from [birth year] through 1965 and any individual with known risks for hepatitis C.  Skin self-exam. / Monthly.  Influenza vaccine. / Every  year.  Tetanus, diphtheria, and acellular pertussis (Tdap/Td) vaccine.** / Consult your health care provider. 1 dose of Td every 10 years.  Varicella vaccine.** / Consult your health care provider.  Zoster vaccine.** / 1 dose for adults aged 53 years or older.  Measles, mumps, rubella (MMR) vaccine.** / You need at least 1 dose of MMR if you were born in 1957 or later. You may also need a second dose.  Pneumococcal 13-valent conjugate (PCV13) vaccine.** / Consult your health care provider.  Pneumococcal polysaccharide (PPSV23) vaccine.** / 1 to 2 doses if you smoke cigarettes or if you have certain conditions.  Meningococcal vaccine.** / Consult your health care provider.  Hepatitis A vaccine.** / Consult your health care provider.  Hepatitis B vaccine.** / Consult your health care provider.  Haemophilus influenzae type b (Hib) vaccine.** / Consult your health care provider. Ages 77 and over  Blood pressure check.** / Every 1 to 2 years.  Lipid and cholesterol check.**/ Every 5 years beginning at age 85.  Lung cancer screening. / Every year if you are aged 55-80 years and have a 30-pack-year history of smoking and currently smoke or have quit within the past 15 years. Yearly screening is stopped once you have quit smoking for at least 15 years or develop a health problem that would prevent you from having lung cancer treatment.  Fecal occult blood test (FOBT) of stool. / Every year beginning at age 33 and continuing until age 11. You may not have to do this test if you get a colonoscopy every 10 years.  Flexible sigmoidoscopy** or colonoscopy.** / Every 5 years for a flexible sigmoidoscopy or every 10 years for a colonoscopy beginning at age 28 and continuing until age 73.  Hepatitis C blood  test.** / For all people born from [birth year] through 1965 and any individual with known risks for hepatitis C.  Abdominal aortic aneurysm (AAA) screening.** / A one-time screening for ages 50 to 27 years who are current or former smokers.  Skin self-exam. / Monthly.  Influenza vaccine. / Every year.  Tetanus, diphtheria, and acellular pertussis (Tdap/Td) vaccine.** / 1 dose of Td every 10 years.  Varicella vaccine.** / Consult your health care provider.  Zoster vaccine.** / 1 dose for adults aged 34 years or older.  Pneumococcal 13-valent conjugate (PCV13) vaccine.** / Consult your health care provider.  Pneumococcal polysaccharide (PPSV23) vaccine.** / 1 dose for all adults aged 63 years and older.  Meningococcal vaccine.** / Consult your health care provider.  Hepatitis A vaccine.** / Consult your health care provider.  Hepatitis B vaccine.** / Consult your health care provider.  Haemophilus influenzae type b (Hib) vaccine.** / Consult your health care provider. **Family history and personal history of risk and conditions may change your health care provider's recommendations. Document Released: 01/20/2002 Document Revised: 11/29/2013 Document Reviewed: 04/21/2011 [facility] Patient Information 2015 [place], [place]. This information is not intended to replace advice given to you by your health care provider. Make sure you discuss any questions you have with your health care provider.

## 2014-12-14 NOTE — Progress Notes (Signed)
Pre visit review using our clinic review tool, if applicable. No additional management support is needed unless otherwise documented below in the visit note. 

## 2014-12-14 NOTE — Progress Notes (Signed)
Subjective:    [name] Gibson is a 74 y.o. male who presents for Medicare Initial preventive examination.   Preventive Screening-Counseling & Management  Tobacco History  Smoking status  . Never Smoker   Smokeless tobacco  . Not on file    Problems Prior to Visit 1. Ganglion cyst R wrist--no pain  Current Problems (verified) Patient Active Problem List   Diagnosis Date Noted  . Obesity (BMI 30-39.9) 12/07/2013  . PROSTATE CANCER, HX OF 10/02/2010  . PSA, INCREASED 08/08/2009  . URI 12/13/2008  . SEBACEOUS CYST 12/13/2008  . ANKLE PAIN, RIGHT 11/16/2008  . SINUSITIS- ACUTE-NOS 09/12/2008  . COLONIC POLYPS 10/22/2007  . DIVERTICULOSIS OF COLON 10/22/2007  . HYPERTENSION 09/08/2007  . ANGIOEDEMA 08/25/2007  . HYPERTHYROIDISM 03/23/2007  . HYPERLIPIDEMIA 03/23/2007  . GLAUCOMA 03/23/2007  . GLUCOSE INTOLERANCE, HX OF 03/23/2007    Medications Prior to Visit Current Outpatient Prescriptions on File Prior to Visit  Medication Sig Dispense Refill  . amLODipine (NORVASC) 10 MG tablet TAKE 1 TABLET BY MOUTH DAILY 90 tablet 0  . atorvastatin (LIPITOR) 40 MG tablet TAKE 1 TABLET BY MOUTH AT BEDTIME 30 tablet 0  . bimatoprost (LUMIGAN) 0.03 % ophthalmic solution 1 drop at bedtime.      . hydrochlorothiazide (MICROZIDE) 12.5 MG capsule 1 cap by mouth daily-- 90 capsule 3  . metoprolol (TOPROL-XL) 200 MG 24 hr tablet TAKE ONE TABLET BY MOUTH DAILY 90 tablet 3   No current facility-administered medications on file prior to visit.    Current Medications (verified) Current Outpatient Prescriptions  Medication Sig Dispense Refill  . amLODipine (NORVASC) 10 MG tablet TAKE 1 TABLET BY MOUTH DAILY 90 tablet 0  . atorvastatin (LIPITOR) 40 MG tablet TAKE 1 TABLET BY MOUTH AT BEDTIME 30 tablet 0  . bimatoprost (LUMIGAN) 0.03 % ophthalmic solution 1 drop at bedtime.      . hydrochlorothiazide (MICROZIDE) 12.5 MG capsule 1 cap by mouth daily-- 90 capsule 3  . metoprolol (TOPROL-XL) 200  MG 24 hr tablet TAKE ONE TABLET BY MOUTH DAILY 90 tablet 3  . zoster vaccine live, PF, (ZOSTAVAX) 16109 UNT/0.65ML injection Inject 19,400 Units into the skin once. 1 vial 0   No current facility-administered medications for this visit.     Allergies (verified) Penicillins   PAST HISTORY  Family History Family History  Problem Relation Age of Onset  . Cancer Father     spine  . Cancer Sister     pancrease  . Diabetes Other   . Hyperlipidemia Other   . Hypertension Other   . Coronary artery disease Other     Social History History  Substance Use Topics  . Smoking status: Never Smoker   . Smokeless tobacco: Not on file  . Alcohol Use: Yes    Are there smokers in your home (other than you)?  No  Risk Factors Current exercise habits: Gym/ health club routine includes cardio.  Dietary issues discussed: na   Cardiac risk factors: dyslipidemia, hypertension and male gender.  Depression Screen (Note: if answer to either of the following is "Yes", a more complete depression screening is indicated)   Q1: Over the past two weeks, have you felt down, depressed or hopeless? No  Q2: Over the past two weeks, have you felt little interest or pleasure in doing things? No  Have you lost interest or pleasure in daily life? No  Do you often feel hopeless? No  Do you cry easily over simple problems? No  Activities of  Daily Living In your present state of health, do you have any difficulty performing the following activities?:  Driving? No Managing money?  No Feeding yourself? No Getting from bed to chair? No Climbing a flight of stairs? No Preparing food and eating?: No Bathing or showering? No Getting dressed: No Getting to the toilet? No Using the toilet:No Moving around from place to place: No In the past year have you fallen or had a near fall?:No   Are you sexually active?  Yes  Do you have more than one partner?  No  Hearing Difficulties: No Do you often ask people  to speak up or repeat themselves? No Do you experience ringing or noises in your ears? No Do you have difficulty understanding soft or whispered voices? No   Do you feel that you have a problem with memory? No  Do you often misplace items? No  Do you feel safe at home?  No  Cognitive Testing  Alert? Yes  Normal Appearance?Yes  Oriented to person? Yes  Place? Yes   Time? Yes  Recall of three objects?  Yes  Can perform simple calculations? Yes  Displays appropriate judgment?Yes  Can read the correct time from a watch face?Yes   Advanced Directives have been discussed with the patient? Yes   List the Names of Other Physician/Practitioners you currently use: 1.  dentiist- [name] Gibson 2.  opth-- [name] Gibson 3.  Urology-- [name] Gibson any recent Medical Services you may have received from other than [facility] providers in the past year (date may be approximate).  Immunization History  Administered Date(s) Administered  . Influenza Split 10/15/2011  . Influenza Whole 10/02/2010  . Influenza, High Dose Seasonal PF 09/20/2014  . Influenza,inj,Quad PF,36+ Mos 12/07/2013  . Pneumococcal Conjugate-13 09/20/2014  . Pneumococcal Polysaccharide-23 04/23/2005  . Td 01/15/2000, 10/02/2010  . Zoster 07/22/2006    Screening Tests Health Maintenance  Topic Date Due  . INFLUENZA VACCINE  07/09/2015  . COLONOSCOPY  09/14/2015  . TETANUS/TDAP  10/02/2020  . PNEUMOCOCCAL POLYSACCHARIDE VACCINE AGE 75 AND OVER  Completed  . ZOSTAVAX  Completed    All answers were reviewed with the patient and necessary referrals were made:  [name] Koyanagi, DO   12/14/2014   History reviewed:  He  has a past medical history of Hyperlipidemia; Hypertension; and Cancer. He  does not have any pertinent problems on file. He  has past surgical history that includes Appendectomy and radioactive seed implation. His family history includes Cancer in his father and sister; Coronary artery disease in his other; Diabetes  in his other; Hyperlipidemia in his other; Hypertension in his other. He  reports that he has never smoked. He does not have any smokeless tobacco history on file. He reports that he drinks alcohol. He reports that he does not use illicit drugs. He has a current medication list which includes the following prescription(s): amlodipine, atorvastatin, bimatoprost, hydrochlorothiazide, metoprolol, and zoster vaccine live (pf). Current Outpatient Prescriptions on File Prior to Visit  Medication Sig Dispense Refill  . amLODipine (NORVASC) 10 MG tablet TAKE 1 TABLET BY MOUTH DAILY 90 tablet 0  . atorvastatin (LIPITOR) 40 MG tablet TAKE 1 TABLET BY MOUTH AT BEDTIME 30 tablet 0  . bimatoprost (LUMIGAN) 0.03 % ophthalmic solution 1 drop at bedtime.      . hydrochlorothiazide (MICROZIDE) 12.5 MG capsule 1 cap by mouth daily-- 90 capsule 3  . metoprolol (TOPROL-XL) 200 MG 24 hr tablet TAKE ONE TABLET BY MOUTH DAILY 90 tablet 3  No current facility-administered medications on file prior to visit.   He is allergic to penicillins.  Review of Systems  Review of Systems  Constitutional: Negative for activity change, appetite change and fatigue.  HENT: Negative for hearing loss, congestion, tinnitus and ear discharge.   Eyes: Negative for visual disturbance (see optho q1y -- vision corrected to 20/20 with glasses).  Respiratory: Negative for cough, chest tightness and shortness of breath.   Cardiovascular: Negative for chest pain, palpitations and leg swelling.  Gastrointestinal: Negative for abdominal pain, diarrhea, constipation and abdominal distention.  Genitourinary: Negative for urgency, frequency, decreased urine volume and difficulty urinating.  Musculoskeletal: Negative for back pain, arthralgias and gait problem.  Skin: Negative for color change, pallor and rash.  Neurological: Negative for dizziness, light-headedness, numbness and headaches.  Hematological: Negative for adenopathy. Does not  bruise/bleed easily.  Psychiatric/Behavioral: Negative for suicidal ideas, confusion, sleep disturbance, self-injury, dysphoric mood, decreased concentration and agitation.  Pt is able to read and write and can do all ADLs No risk for falling No abuse/ violence in home      Objective:     Vision by Snellen chart: opth Blood pressure 135/75, pulse 84, temperature 98.2 F (36.8 C), temperature source Oral, height 5\' 5"  (1.651 m), weight 187 lb (84.823 kg), SpO2 95 %. Body mass index is 31.12 kg/(m^2).  BP 135/75 mmHg  Pulse 84  Temp(Src) 98.2 F (36.8 C) (Oral)  Ht 5\' 5"  (1.651 m)  Wt 187 lb (84.823 kg)  BMI 31.12 kg/m2  SpO2 95% General appearance: alert, cooperative, appears stated age and no distress Head: Normocephalic, without obvious abnormality, atraumatic Eyes: conjunctivae/corneas clear. PERRL, EOM's intact. Fundi benign. Ears: normal TM's and external ear canals both ears Nose: Nares normal. Septum midline. Mucosa normal. No drainage or sinus tenderness. Throat: lips, mucosa, and tongue normal; teeth and gums normal Neck: no adenopathy, no carotid bruit, no JVD, supple, symmetrical, trachea midline and thyroid not enlarged, symmetric, no tenderness/mass/nodules Back: symmetric, no curvature. ROM normal. No CVA tenderness. Lungs: clear to auscultation bilaterally Chest wall: no tenderness Heart: regular rate and rhythm, S1, S2 normal, no murmur, click, rub or gallop Abdomen: soft, non-tender; bowel sounds normal; no masses,  no organomegaly Male genitalia: deferred0---urology Rectal: deferred--urology Extremities: extremities normal, atraumatic, no cyanosis or edema Pulses: 2+ and symmetric Skin: Skin color, texture, turgor normal. No rashes or lesions Lymph nodes: Cervical, supraclavicular, and axillary nodes normal. Neurologic: Alert and oriented X 3, normal strength and tone. Normal symmetric reflexes. Normal coordination and gait Psych-- no depression, no anxiety       Assessment:     cpe      Plan:     During the course of the visit the patient was educated and counseled about appropriate screening and preventive services including:    Pneumococcal vaccine   Influenza vaccine  Prostate cancer screening  Colorectal cancer screening  Diabetes screening  Glaucoma screening  Advanced directives: has an advanced directive - a copy HAS NOT been provided.  Diet review for nutrition referral? Yes ____  Not Indicated __x__   Patient Instructions (the written plan) was given to the patient.  Medicare Attestation I have personally reviewed: The patient's medical and social history Their use of alcohol, tobacco or illicit drugs Their current medications and supplements The patient's functional ability including ADLs,fall risks, home safety risks, cognitive, and hearing and visual impairment Diet and physical activities Evidence for depression or mood disorders  The patient's weight, height, BMI, and visual acuity  have been recorded in the chart.  I have made referrals, counseling, and provided education to the patient based on review of the above and I have provided the patient with a written personalized care plan for preventive services.    1. Need for shingles vaccine   - zoster vaccine live, PF, (ZOSTAVAX) 23953 UNT/0.65ML injection; Inject 19,400 Units into the skin once.  Dispense: 1 vial; Refill: 0  2. Preventative health care    3. Essential hypertension Stable-- con't meds - Basic metabolic panel - CBC with Differential - Microalbumin / creatinine urine ratio - POCT urinalysis dipstick  4. 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 History of prostate cancer F/u urology - PSA  [name] Koyanagi, DO   12/14/2014

## 2014-12-16 ENCOUNTER — Other Ambulatory Visit: Payer: Self-pay | Admitting: Family Medicine

## 2014-12-18 DIAGNOSIS — H4011X2 Primary open-angle glaucoma, moderate stage: Secondary | ICD-10-CM | POA: Diagnosis not present

## 2014-12-18 DIAGNOSIS — H2513 Age-related nuclear cataract, bilateral: Secondary | ICD-10-CM | POA: Diagnosis not present

## 2014-12-18 DIAGNOSIS — H04123 Dry eye syndrome of bilateral lacrimal glands: Secondary | ICD-10-CM | POA: Diagnosis not present

## 2015-01-30 ENCOUNTER — Encounter: Payer: Self-pay | Admitting: Family Medicine

## 2015-01-30 ENCOUNTER — Ambulatory Visit (INDEPENDENT_AMBULATORY_CARE_PROVIDER_SITE_OTHER): Payer: Medicare Other | Admitting: Family Medicine

## 2015-01-30 VITALS — BP 126/80 | HR 78 | Temp 97.9°F | Wt 186.0 lb

## 2015-01-30 DIAGNOSIS — M25511 Pain in right shoulder: Secondary | ICD-10-CM | POA: Diagnosis not present

## 2015-01-30 NOTE — Progress Notes (Signed)
Pre visit review using our clinic review tool, if applicable. No additional management support is needed unless otherwise documented below in the visit note. 

## 2015-01-30 NOTE — Patient Instructions (Signed)

## 2015-01-30 NOTE — Progress Notes (Signed)
  [name] Gibson is a 74 y.o. male  Shoulder Pain: Patient complaints of right shoulder pain. This is evaluated as a personal injury. The pain is described as tingling.  The onset of the pain was Sat am -- woke up with pain..  The pain occurs several times per day and lasts several minutes.  Location is scapular, biceps tendon, neck, trapezius. No history of dislocation. Symptoms are aggravated by no limitations. Symptoms are diminisohed by  rest.   Limited activities include: no limitations. No stiffness, no weakness, no swelling, no crepitus noted is reported. Patient is a retired and he has not missed work.   Past Medical History  Diagnosis Date  . Hyperlipidemia   . Hypertension   . Cancer     prostate   Past Surgical History  Procedure Laterality Date  . Appendectomy    . Radioactive seed implation      prostate    Current outpatient prescriptions:  .  amLODipine (NORVASC) 10 MG tablet, TAKE 1 TABLET BY MOUTH DAILY, Disp: 90 tablet, Rfl: 0 .  atorvastatin (LIPITOR) 40 MG tablet, TAKE 1 TABLET BY MOUTH AT BEDTIME, Disp: 30 tablet, Rfl: 0 .  bimatoprost (LUMIGAN) 0.03 % ophthalmic solution, 1 drop at bedtime.  , Disp: , Rfl:  .  hydrochlorothiazide (MICROZIDE) 12.5 MG capsule, 1 cap by mouth daily--, Disp: 90 capsule, Rfl: 3 .  metoprolol (TOPROL-XL) 200 MG 24 hr tablet, TAKE 1 TABLET BY MOUTH DAILY, Disp: 90 tablet, Rfl: 1 Allergies  Allergen Reactions  . Penicillins     REACTION: unspecified    reports that he has never smoked. He does not have any smokeless tobacco history on file. He reports that he drinks alcohol. He reports that he does not use illicit drugs. Family History  Problem Relation Age of Onset  . Cancer Father     spine  . Cancer Sister     pancrease  . Diabetes Other   . Hyperlipidemia Other   . Hypertension Other   . Coronary artery disease Other     Shoulder: Inspection reveals no abnormalities, atrophy or asymmetry. Palpation is normal with no  tenderness over AC joint or bicipital groove. ROM is full in all planes. Rotator cuff strength normal throughout. No signs of impingement  No labral pathology noted and good stability. Normal scapular function observed. No painful arc and no drop arm sign. No apprehension sign.  1. Pain in joint, shoulder region, right Pain is not bothering him today.   Pt wants to see specialist because he is getting ready to go to [place] and does not want to have a problem when he is over there.   - Ambulatory referral to Sports Medicine

## 2015-01-31 ENCOUNTER — Other Ambulatory Visit: Payer: Self-pay | Admitting: Family Medicine

## 2015-02-06 ENCOUNTER — Encounter: Payer: Self-pay | Admitting: Family Medicine

## 2015-02-06 ENCOUNTER — Encounter (INDEPENDENT_AMBULATORY_CARE_PROVIDER_SITE_OTHER): Payer: Self-pay

## 2015-02-06 ENCOUNTER — Ambulatory Visit (INDEPENDENT_AMBULATORY_CARE_PROVIDER_SITE_OTHER): Payer: Medicare Other | Admitting: Family Medicine

## 2015-02-06 VITALS — BP 156/83 | HR 71 | Ht 66.0 in | Wt 180.0 lb

## 2015-02-06 DIAGNOSIS — M25511 Pain in right shoulder: Secondary | ICD-10-CM | POA: Diagnosis not present

## 2015-02-06 NOTE — Patient Instructions (Signed)
Your pain is either due to rotator cuff impingement or spasms of one of your scapular stabilizer muscles. The most important thing to do is strengthen these muscles. Do home exercises 3 sets of 10 once a day for next 6 weeks. Icing (or heat), tylenol or motrin only if needed when this is painful. Expect over the next few weeks for this to become more infrequent and eventually go away. Follow up with me in 5-6 weeks. Have fun on your trip!

## 2015-02-08 DIAGNOSIS — M25511 Pain in right shoulder: Secondary | ICD-10-CM

## 2015-02-08 HISTORY — DX: Pain in right shoulder: M25.511

## 2015-02-08 NOTE — Progress Notes (Signed)
PCP and referred by: Garnet Koyanagi, DO  Subjective:   HPI: Patient is a 74 y.o. male here for right shoulder pain.  Patient reports he has had right shoulder pain for about 1 month. No known trauma or injury. Feels pain on top and back part of the shoulder. Pain comes and goes - no pain currently. Only once his hand was tingly but thinks it was because of positioning. Nothing he does brings this on that he is aware of.  Past Medical History  Diagnosis Date  . Hyperlipidemia   . Hypertension   . Cancer     prostate    Current Outpatient Prescriptions on File Prior to Visit  Medication Sig Dispense Refill  . amLODipine (NORVASC) 10 MG tablet TAKE 1 TABLET BY MOUTH DAILY 90 tablet 0  . atorvastatin (LIPITOR) 40 MG tablet TAKE 1 TABLET BY MOUTH AT BEDTIME 30 tablet 5  . bimatoprost (LUMIGAN) 0.03 % ophthalmic solution 1 drop at bedtime.      . hydrochlorothiazide (MICROZIDE) 12.5 MG capsule TAKE ONE CAPSULE BY MOUTH DAILY 90 capsule 1  . metoprolol (TOPROL-XL) 200 MG 24 hr tablet TAKE 1 TABLET BY MOUTH DAILY 90 tablet 1   No current facility-administered medications on file prior to visit.    Past Surgical History  Procedure Laterality Date  . Appendectomy    . Radioactive seed implation      prostate    Allergies  Allergen Reactions  . Penicillins     REACTION: unspecified    History   Social History  . Marital Status: Divorced    Spouse Name: N/A  . Number of Children: N/A  . Years of Education: N/A   Occupational History  . Salesman    Social History Main Topics  . Smoking status: Never Smoker   . Smokeless tobacco: Not on file  . Alcohol Use: 0.0 oz/week    0 Standard drinks or equivalent per week  . Drug Use: No  . Sexual Activity:    Partners: Female   Other Topics Concern  . Not on file   Social History Narrative   Reg exercise--walking 2-3 x a week    Family History  Problem Relation Age of Onset  . Cancer Father     spine  . Cancer  Sister     pancrease  . Diabetes Other   . Hyperlipidemia Other   . Hypertension Other   . Coronary artery disease Other     BP 156/83 mmHg  Pulse 71  Ht 5\' 6"  (1.676 m)  Wt 180 lb (81.647 kg)  BMI 29.07 kg/m2  Review of Systems: See HPI above.    Objective:  Physical Exam:  Gen: NAD  Right shoulder: No swelling, ecchymoses.  No gross deformity. No TTP. FROM. Negative Hawkins, Neers. Negative Speeds, Yergasons. Strength 5/5 with empty can and resisted internal/external rotation. Negative apprehension. NV intact distally.    Assessment & Plan:  1. Right shoulder pain - not currently in pain though he describes either spasms of scapular stabilizers or intermittent rotator cuff impingement.  Given home exercise program to do daily for this for next 6 weeks.  F/u in 5-6 weeks.  Call if this worsens or becomes more constant.

## 2015-02-08 NOTE — Assessment & Plan Note (Signed)
not currently in pain though he describes either spasms of scapular stabilizers or intermittent rotator cuff impingement.  Given home exercise program to do daily for this for next 6 weeks.  F/u in 5-6 weeks.  Call if this worsens or becomes more constant.

## 2015-02-14 DIAGNOSIS — C61 Malignant neoplasm of prostate: Secondary | ICD-10-CM | POA: Diagnosis not present

## 2015-02-21 DIAGNOSIS — C61 Malignant neoplasm of prostate: Secondary | ICD-10-CM | POA: Diagnosis not present

## 2015-02-21 DIAGNOSIS — R972 Elevated prostate specific antigen [PSA]: Secondary | ICD-10-CM | POA: Diagnosis not present

## 2015-03-08 ENCOUNTER — Other Ambulatory Visit: Payer: Self-pay | Admitting: Family Medicine

## 2015-03-19 DIAGNOSIS — H2513 Age-related nuclear cataract, bilateral: Secondary | ICD-10-CM | POA: Diagnosis not present

## 2015-03-19 DIAGNOSIS — H4011X2 Primary open-angle glaucoma, moderate stage: Secondary | ICD-10-CM | POA: Diagnosis not present

## 2015-03-19 DIAGNOSIS — H04123 Dry eye syndrome of bilateral lacrimal glands: Secondary | ICD-10-CM | POA: Diagnosis not present

## 2015-06-29 ENCOUNTER — Other Ambulatory Visit: Payer: Self-pay | Admitting: Family Medicine

## 2015-08-02 ENCOUNTER — Other Ambulatory Visit: Payer: Self-pay

## 2015-08-02 ENCOUNTER — Encounter: Payer: Self-pay | Admitting: Family Medicine

## 2015-08-02 ENCOUNTER — Ambulatory Visit (INDEPENDENT_AMBULATORY_CARE_PROVIDER_SITE_OTHER): Payer: Medicare Other | Admitting: Family Medicine

## 2015-08-02 VITALS — BP 132/80 | HR 68 | Temp 99.2°F | Wt 185.0 lb

## 2015-08-02 DIAGNOSIS — K121 Other forms of stomatitis: Secondary | ICD-10-CM | POA: Diagnosis not present

## 2015-08-02 DIAGNOSIS — T783XXA Angioneurotic edema, initial encounter: Secondary | ICD-10-CM

## 2015-08-02 MED ORDER — METHYLPREDNISOLONE ACETATE 80 MG/ML IJ SUSP
80.0000 mg | Freq: Once | INTRAMUSCULAR | Status: AC
  Administered 2015-08-02: 80 mg via INTRAMUSCULAR

## 2015-08-02 MED ORDER — PREDNISONE 10 MG PO TABS: ORAL_TABLET | ORAL | Status: DC

## 2015-08-02 MED ORDER — NYSTATIN 100000 UNIT/ML MT SUSP
OROMUCOSAL | Status: DC
Start: 2015-08-02 — End: 2016-10-08

## 2015-08-02 NOTE — Patient Instructions (Addendum)
otc antihistamine---- allegra, zyrtec , claritin or benadryl    Angioedema Angioedema is a sudden swelling of tissues, often of the skin. It can occur on the face or genitals or in the abdomen or other body parts. The swelling usually develops over a short period and gets better in 24 to 48 hours. It often begins during the night and is found when the person wakes up. The person may also get red, itchy patches of skin (hives). Angioedema can be dangerous if it involves swelling of the air passages.  Depending on the cause, episodes of angioedema may only happen once, come back in unpredictable patterns, or repeat for several years and then gradually fade away.  CAUSES  Angioedema can be caused by an allergic reaction to various triggers. It can also result from nonallergic causes, including reactions to drugs, immune system disorders, viral infections, or an abnormal gene that is passed to you from your parents (hereditary). For some people with angioedema, the cause is unknown.  Some things that can trigger angioedema include:   Foods.   Medicines, such as ACE inhibitors, ARBs, nonsteroidal anti-inflammatory agents, or estrogen.   Latex.   Animal saliva.   Insect stings.   Dyes used in X-rays.   Mild injury.   Dental work.  Surgery.  Stress.   Sudden changes in temperature.   Exercise. SIGNS AND SYMPTOMS   Swelling of the skin.  Hives. If these are present, there is also intense itching.  Redness in the affected area.   Pain in the affected area.  Swollen lips or tongue.  Breathing problems. This may happen if the air passages swell.  Wheezing. If internal organs are involved, there may be:   Nausea.   Abdominal pain.   Vomiting.   Difficulty swallowing.   Difficulty passing urine. DIAGNOSIS   Your health care provider will examine the affected area and take a medical and family history.  Various tests may be done to help determine the  cause. Tests may include:  Allergy skin tests to see if the problem is an allergic reaction.   Blood tests to check for hereditary angioedema.   Tests to check for underlying diseases that could cause the condition.   A review of your medicines, including over-the-counter medicines, may be done. TREATMENT  Treatment will depend on the cause of the angioedema. Possible treatments include:   Removal of anything that triggered the condition (such as stopping certain medicines).   Medicines to treat symptoms or prevent attacks. Medicines given may include:   Antihistamines.   Epinephrine injection.   Steroids.   Hospitalization may be required for severe attacks. If the air passages are affected, it can be an emergency. Tubes may need to be placed to keep the airway open. HOME CARE INSTRUCTIONS   Take all medicines as directed by your health care provider.  If you were given medicines for emergency allergy treatment, always carry them with you.  Wear a medical bracelet as directed by your health care provider.   Avoid known triggers. SEEK MEDICAL CARE IF:   You have repeat attacks of angioedema.   Your attacks are more frequent or more severe despite preventive measures.   You have hereditary angioedema and are considering having children. It is important to discuss with your health care provider the risks of passing the condition on to your children. SEEK IMMEDIATE MEDICAL CARE IF:   You have severe swelling of the mouth, tongue, or lips.  You have difficulty breathing.  You have difficulty swallowing.   You faint. MAKE SURE YOU:  Understand these instructions.  Will watch your condition.  Will get help right away if you are not doing well or get worse. Document Released: 02/02/2002 Document Revised: 04/10/2014 Document Reviewed: 07/18/2013 [facility] Patient Information 2015 [place], [place]. This information is not intended to replace advice given to  you by your health care provider. Make sure you discuss any questions you have with your health care provider.

## 2015-08-02 NOTE — Progress Notes (Signed)
Pre visit review using our clinic review tool, if applicable. No additional management support is needed unless otherwise documented below in the visit note. 

## 2015-08-02 NOTE — Progress Notes (Signed)
Patient ID: [name] Gibson, male   DOB: 1941/06/17, 74 y.o.   MRN: [id number]   Subjective:    Patient ID: [name] Gibson, male    DOB: Apr 29, 1941, 74 y.o.   MRN: [id number]  Chief Complaint  Patient presents with  . Oral Swelling    c/o right sided bottom lip and jaw swelling x's 4 days    HPI Patient is in today for swelling bottom lip and sores in mouth.  No fever, no trouble breathing. No new detergents , soaps, lotions etc.     Past Medical History  Diagnosis Date  . Hyperlipidemia   . Hypertension   . Cancer     prostate    Past Surgical History  Procedure Laterality Date  . Appendectomy    . Radioactive seed implation      prostate    Family History  Problem Relation Age of Onset  . Cancer Father     spine  . Cancer Sister     pancrease  . Diabetes Other   . Hyperlipidemia Other   . Hypertension Other   . Coronary artery disease Other     Social History   Social History  . Marital Status: Divorced    Spouse Name: N/A  . Number of Children: N/A  . Years of Education: N/A   Occupational History  . Salesman    Social History Main Topics  . Smoking status: Never Smoker   . Smokeless tobacco: Not on file  . Alcohol Use: 0.0 oz/week    0 Standard drinks or equivalent per week  . Drug Use: No  . Sexual Activity:    Partners: Female   Other Topics Concern  . Not on file   Social History Narrative   Reg exercise--walking 2-3 x a week    Outpatient Prescriptions Prior to Visit  Medication Sig Dispense Refill  . amLODipine (NORVASC) 10 MG tablet TAKE 1 TABLET BY MOUTH DAILY 90 tablet 3  . atorvastatin (LIPITOR) 40 MG tablet TAKE 1 TABLET BY MOUTH AT BEDTIME 30 tablet 5  . hydrochlorothiazide (MICROZIDE) 12.5 MG capsule TAKE ONE CAPSULE BY MOUTH DAILY 90 capsule 1  . LUMIGAN 0.01 % SOLN   4  . metoprolol (TOPROL-XL) 200 MG 24 hr tablet TAKE 1 TABLET BY MOUTH DAILY 90 tablet 1  . bimatoprost (LUMIGAN) 0.03 % ophthalmic solution 1 drop at bedtime.        No facility-administered medications prior to visit.    Allergies  Allergen Reactions  . Penicillins     REACTION: unspecified    Review of Systems  Constitutional: Negative for fever and malaise/fatigue.  HENT: Negative for congestion.   Eyes: Negative for discharge.  Respiratory: Negative for shortness of breath.   Cardiovascular: Negative for chest pain, palpitations and leg swelling.  Gastrointestinal: Negative for nausea and abdominal pain.  Genitourinary: Negative for dysuria.  Musculoskeletal: Negative for falls.  Skin: Negative for rash.  Neurological: Negative for loss of consciousness and headaches.  Endo/Heme/Allergies: Negative for environmental allergies.  Psychiatric/Behavioral: Negative for depression. The patient is not nervous/anxious.        Objective:    Physical Exam  Constitutional: He is oriented to person, place, and time. Vital signs are normal. He appears well-developed and well-nourished. He is sleeping.  HENT:  Head: Normocephalic and atraumatic.    Mouth/Throat: Oropharynx is clear and moist.  Eyes: EOM are normal. Pupils are equal, round, and reactive to light.  Neck: Normal range of motion. Neck supple. No  thyromegaly present.  Cardiovascular: Normal rate and regular rhythm.   No murmur heard. Pulmonary/Chest: Effort normal and breath sounds normal. No respiratory distress. He has no wheezes. He has no rales. He exhibits no tenderness.  Musculoskeletal: He exhibits no edema or tenderness.  Neurological: He is alert and oriented to person, place, and time.  Skin: Skin is warm and dry.  Psychiatric: He has a normal mood and affect. His behavior is normal. Judgment and thought content normal.    BP 132/80 mmHg  Pulse 68  Temp(Src) 99.2 F (37.3 C) (Oral)  Wt 185 lb (83.915 kg)  SpO2 99% Wt Readings from Last 3 Encounters:  08/02/15 185 lb (83.915 kg)  02/06/15 180 lb (81.647 kg)  01/30/15 186 lb (84.369 kg)     Lab Results   Component Value Date   WBC 5.3 12/14/2014   HGB 14.9 12/14/2014   HCT 45.3 12/14/2014   PLT 215.0 12/14/2014   GLUCOSE 93 12/14/2014   CHOL 172 12/14/2014   TRIG 81.0 12/14/2014   HDL 46.30 12/14/2014   LDLDIRECT 173.4 03/16/2013   LDLCALC 110* 12/14/2014   ALT 52 12/14/2014   AST 39* 12/14/2014   NA 137 12/14/2014   K 3.8 12/14/2014   CL 100 12/14/2014   CREATININE 1.1 12/14/2014   BUN 16 12/14/2014   CO2 27 12/14/2014   TSH 2.28 12/14/2014   PSA 0.16 12/14/2014   INR 1.03 12/12/2009   HGBA1C 6.1 03/16/2013   MICROALBUR 0.6 12/14/2014    Lab Results  Component Value Date   TSH 2.28 12/14/2014   Lab Results  Component Value Date   WBC 5.3 12/14/2014   HGB 14.9 12/14/2014   HCT 45.3 12/14/2014   MCV 96.3 12/14/2014   PLT 215.0 12/14/2014   Lab Results  Component Value Date   NA 137 12/14/2014   K 3.8 12/14/2014   CO2 27 12/14/2014   GLUCOSE 93 12/14/2014   BUN 16 12/14/2014   CREATININE 1.1 12/14/2014   BILITOT 0.7 12/14/2014   ALKPHOS 53 12/14/2014   AST 39* 12/14/2014   ALT 52 12/14/2014   PROT 7.6 12/14/2014   ALBUMIN 4.5 12/14/2014   CALCIUM 9.8 12/14/2014   GFR 81.25 12/14/2014   Lab Results  Component Value Date   CHOL 172 12/14/2014   Lab Results  Component Value Date   HDL 46.30 12/14/2014   Lab Results  Component Value Date   LDLCALC 110* 12/14/2014   Lab Results  Component Value Date   TRIG 81.0 12/14/2014   Lab Results  Component Value Date   CHOLHDL 4 12/14/2014   Lab Results  Component Value Date   HGBA1C 6.1 03/16/2013       Assessment & Plan:   Problem List Items Addressed This Visit    None    Visit Diagnoses    Angioedema, initial encounter    -  Primary    Relevant Medications    predniSONE (DELTASONE) 10 MG tablet    methylPREDNISolone acetate (DEPO-MEDROL) injection 80 mg (Completed)    Stomatitis        Relevant Medications    nystatin (MYCOSTATIN) 100000 UNIT/ML suspension     pt instructed to go to  ER if any trouble breathing or inc in swelling  I am having [name] Gibson start on predniSONE and nystatin. I am also having him maintain his hydrochlorothiazide, atorvastatin, LUMIGAN, amLODipine, and metoprolol. We administered methylPREDNISolone acetate.  Meds ordered this encounter  Medications  . predniSONE (DELTASONE) 10  MG tablet    Sig: 3 po qd for 3 days then 2 po qd for 3 days the 1 po qd for 3 days    Dispense:  18 tablet    Refill:  0  . methylPREDNISolone acetate (DEPO-MEDROL) injection 80 mg    Sig:   . nystatin (MYCOSTATIN) 100000 UNIT/ML suspension    Sig: 5 ml swish and spit qid    Dispense:  150 mL    Refill:  0     Garnet Koyanagi, DO

## 2015-08-03 ENCOUNTER — Ambulatory Visit: Payer: Medicare Other | Admitting: Family Medicine

## 2015-09-03 DIAGNOSIS — L910 Hypertrophic scar: Secondary | ICD-10-CM | POA: Diagnosis not present

## 2015-10-06 ENCOUNTER — Other Ambulatory Visit: Payer: Self-pay | Admitting: Family Medicine

## 2015-10-10 DIAGNOSIS — H04123 Dry eye syndrome of bilateral lacrimal glands: Secondary | ICD-10-CM | POA: Diagnosis not present

## 2015-10-10 DIAGNOSIS — H2513 Age-related nuclear cataract, bilateral: Secondary | ICD-10-CM | POA: Diagnosis not present

## 2015-10-10 DIAGNOSIS — H401133 Primary open-angle glaucoma, bilateral, severe stage: Secondary | ICD-10-CM | POA: Diagnosis not present

## 2015-12-04 ENCOUNTER — Telehealth: Payer: Self-pay | Admitting: Family Medicine

## 2015-12-04 ENCOUNTER — Ambulatory Visit (INDEPENDENT_AMBULATORY_CARE_PROVIDER_SITE_OTHER): Payer: Medicare Other | Admitting: Family Medicine

## 2015-12-04 ENCOUNTER — Encounter: Payer: Self-pay | Admitting: Family Medicine

## 2015-12-04 VITALS — BP 124/68 | HR 63 | Temp 98.2°F | Ht 66.0 in | Wt 181.4 lb

## 2015-12-04 DIAGNOSIS — R0789 Other chest pain: Secondary | ICD-10-CM | POA: Diagnosis not present

## 2015-12-04 DIAGNOSIS — R42 Dizziness and giddiness: Secondary | ICD-10-CM

## 2015-12-04 DIAGNOSIS — M79602 Pain in left arm: Secondary | ICD-10-CM | POA: Diagnosis not present

## 2015-12-04 NOTE — Progress Notes (Signed)
Patient ID: [name] Gibson, male    DOB: 1941-01-24  Age: 74 y.o. MRN: [id number]    Subjective:  Subjective HPI [name] Gibson presents for c/o dizziness and left arm pain.  No chest pain.  No sob.  He noticed it this am and it has been off and on  Review of Systems  Constitutional: Negative for diaphoresis, appetite change, fatigue and unexpected weight change.  Eyes: Negative for pain, redness and visual disturbance.  Respiratory: Negative for cough, chest tightness, shortness of breath and wheezing.   Cardiovascular: Negative for chest pain, palpitations and leg swelling.  Endocrine: Negative for cold intolerance, heat intolerance, polydipsia, polyphagia and polyuria.  Genitourinary: Negative for dysuria, frequency and difficulty urinating.  Neurological: Positive for dizziness. Negative for light-headedness, numbness and headaches.    History Past Medical History  Diagnosis Date  . Hyperlipidemia   . Hypertension   . Cancer [facility])     prostate    He has past surgical history that includes Appendectomy and radioactive seed implation.   His family history includes Cancer in his father and sister; Coronary artery disease in his other; Diabetes in his other; Hyperlipidemia in his other; Hypertension in his other.He reports that he has never smoked. He does not have any smokeless tobacco history on file. He reports that he drinks alcohol. He reports that he does not use illicit drugs.  Current Outpatient Prescriptions on File Prior to Visit  Medication Sig Dispense Refill  . amLODipine (NORVASC) 10 MG tablet TAKE 1 TABLET BY MOUTH DAILY 90 tablet 3  . atorvastatin (LIPITOR) 40 MG tablet TAKE 1 TABLET BY MOUTH AT BEDTIME 30 tablet 5  . hydrochlorothiazide (MICROZIDE) 12.5 MG capsule TAKE ONE CAPSULE BY MOUTH DAILY 90 capsule 1  . LUMIGAN 0.01 % SOLN   4  . metoprolol (TOPROL-XL) 200 MG 24 hr tablet TAKE 1 TABLET BY MOUTH DAILY 90 tablet 1  . nystatin (MYCOSTATIN) 100000 UNIT/ML  suspension 5 ml swish and spit qid 150 mL 0  . predniSONE (DELTASONE) 10 MG tablet 3 po qd for 3 days then 2 po qd for 3 days the 1 po qd for 3 days 18 tablet 0   No current facility-administered medications on file prior to visit.     Objective:  Objective Physical Exam  Constitutional: He is oriented to person, place, and time. Vital signs are normal. He appears well-developed and well-nourished. He is sleeping.  HENT:  Head: Normocephalic and atraumatic.  Mouth/Throat: Oropharynx is clear and moist.  Eyes: EOM are normal. Pupils are equal, round, and reactive to light.  Neck: Normal range of motion. Neck supple. No thyromegaly present.  Cardiovascular: Normal rate and regular rhythm.   No murmur heard. Pulmonary/Chest: Effort normal and breath sounds normal. No respiratory distress. He has no wheezes. He has no rales. He exhibits no tenderness.  Musculoskeletal: He exhibits no edema or tenderness.  Neurological: He is alert and oriented to person, place, and time.  Skin: Skin is warm and dry.  Psychiatric: He has a normal mood and affect. His behavior is normal. Judgment and thought content normal.  Nursing note and vitals reviewed.  BP 124/68 mmHg  Pulse 63  Temp(Src) 98.2 F (36.8 C) (Oral)  Ht 5\' 6"  (1.676 m)  Wt 181 lb 6.4 oz (82.283 kg)  BMI 29.29 kg/m2  SpO2 98% Wt Readings from Last 3 Encounters:  12/04/15 181 lb 6.4 oz (82.283 kg)  08/02/15 185 lb (83.915 kg)  02/06/15 180 lb (81.647 kg)  Lab Results  Component Value Date   WBC 5.3 12/14/2014   HGB 14.9 12/14/2014   HCT 45.3 12/14/2014   PLT 215.0 12/14/2014   GLUCOSE 93 12/14/2014   CHOL 172 12/14/2014   TRIG 81.0 12/14/2014   HDL 46.30 12/14/2014   LDLDIRECT 173.4 03/16/2013   LDLCALC 110* 12/14/2014   ALT 52 12/14/2014   AST 39* 12/14/2014   NA 137 12/14/2014   K 3.8 12/14/2014   CL 100 12/14/2014   CREATININE 1.1 12/14/2014   BUN 16 12/14/2014   CO2 27 12/14/2014   TSH 2.28 12/14/2014    PSA 0.16 12/14/2014   INR 1.03 12/12/2009   HGBA1C 6.1 03/16/2013   MICROALBUR 0.6 12/14/2014    US Abdomen Complete  12/10/2011  *RADIOLOGY REPORT* Clinical Data:  Elevated LFTs, history of alcohol abuse COMPLETE ABDOMINAL ULTRASOUND Comparison:  None. Findings: Gallbladder:  No gallstones, gallbladder wall thickening, or pericholecystic fluid.  Negative sonographic Murphy's sign. Common bile duct:  Normal in size measuring 1.4 mm in diameter. Liver:  Mild heterogeneity of the hepatic echotexture.  No discrete hepatic lesions.  No ascites. IVC:  Appears normal. Pancreas:  Limited visualization of the pancreatic head and neck is normal.  The body and tail are largely obscured by bowel gas. Spleen:  Normal in size, measuring 8.6 cm in length. Right Kidney:  Normal cortical thickness, echogenicity and size, measuring 12.3 cm in length.  No focal renal lesions.  No echogenic renal stones.  No urinary obstruction. Left Kidney:  Normal cortical thickness, echogenicity and size, measuring 11.1 cm in length.  No focal renal lesions.  No echogenic renal stones.  No urinary obstruction. Abdominal aorta:  No aneurysm identified. IMPRESSION: 1.  Mild heterogeneity of the hepatic echotexture without discrete hepatic lesion. 2.  Negative for cholelithiasis or intra/extrahepatic biliary dilatation. Original Report Authenticated By: Rachel Moulds, M.D.    Assessment & Plan:  Plan I am having [name] maintain his atorvastatin, LUMIGAN, amLODipine, metoprolol, predniSONE, nystatin, and hydrochlorothiazide.  No orders of the defined types were placed in this encounter.    Problem List Items Addressed This Visit    None    Visit Diagnoses    Dizzy    -  Primary    Relevant Orders    EKG 12-Lead (Completed)    Myocardial Perfusion Imaging    Left arm pain        Relevant Orders    EKG 12-Lead (Completed)    Atypical chest pain        Relevant Orders    Myocardial Perfusion Imaging       Follow-up:  Return in about 4 weeks (around 01/01/2016).  Garnet Koyanagi, DO

## 2015-12-04 NOTE — Telephone Encounter (Signed)
Spoke with patient and he stated he is having some dizziness since last Friday and his BP was 80/70. He said Friday the BP was 120/80. Denied CP, the dizziness comes and goes, he has shoulder pain last week but that is gone and doesn't think it is related.  Please advise     KP

## 2015-12-04 NOTE — Progress Notes (Signed)
Pre visit review using our clinic review tool, if applicable. No additional management support is needed unless otherwise documented below in the visit note. 

## 2015-12-04 NOTE — Telephone Encounter (Signed)
What about 445?  We have a bunch of rashes coming in later in day.  We could probably get him seen. If having any CP he should go to ER

## 2015-12-04 NOTE — Telephone Encounter (Signed)
Caller name: Self  Can be reached: [phone] (H)   Reason for call: Patient wants to speak with a nurse about being worked in today with Dr. Etter Sjogren. States he is having an emergency but refused to be transferred to [facility] and would not say what was going on beside he woke up dizzy

## 2015-12-04 NOTE — Telephone Encounter (Signed)
Apt scheduled today at 4:45.     KP

## 2015-12-06 ENCOUNTER — Ambulatory Visit: Payer: Self-pay | Admitting: Family Medicine

## 2015-12-13 ENCOUNTER — Telehealth: Payer: Self-pay | Admitting: Family Medicine

## 2015-12-13 NOTE — Telephone Encounter (Signed)
Caller name: Self   Can be reached: [phone]   Reason for call: Patient states that he saw Dr. Etter Sjogren on 12/27 and was informed that he would be referred to a Cardiologist. States he has not heard anything since and wants to know who he will be seeing.

## 2015-12-13 NOTE — Telephone Encounter (Signed)
This patient is supposed to have a stress test. I gave him:  12/18/2015 7:15 AM [facility]   He said they called him today and made him aware.      KP

## 2015-12-17 ENCOUNTER — Encounter: Payer: Self-pay | Admitting: Cardiology

## 2015-12-17 ENCOUNTER — Telehealth (HOSPITAL_COMMUNITY): Payer: Self-pay | Admitting: *Deleted

## 2015-12-17 NOTE — Telephone Encounter (Signed)
Left message on voicemail in reference to upcoming appointment scheduled for 12/18/15. Phone number given for a call back so details instructions can be given. [name] Gibson

## 2015-12-18 ENCOUNTER — Ambulatory Visit (HOSPITAL_COMMUNITY): Payer: Medicare Other

## 2015-12-18 ENCOUNTER — Telehealth (HOSPITAL_COMMUNITY): Payer: Self-pay | Admitting: *Deleted

## 2015-12-18 NOTE — Telephone Encounter (Signed)
Patient given detailed instructions on 1/109/17 per Myocardial Perfusion Study Information Sheet for the test on 12/20/15 at 0715. Patient notified to arrive 15 minutes early and that it is imperative to arrive on time for appointment to keep from having the test rescheduled.  If you need to cancel or reschedule your appointment, please call the office within 24 hours of your appointment. Failure to do so may result in a cancellation of your appointment, and a $50 no show fee. Patient verbalized understanding.Velicia Dejager, Ranae Palms

## 2015-12-20 ENCOUNTER — Ambulatory Visit (HOSPITAL_COMMUNITY): Payer: Medicare Other | Attending: Cardiology

## 2015-12-20 DIAGNOSIS — Z8249 Family history of ischemic heart disease and other diseases of the circulatory system: Secondary | ICD-10-CM | POA: Diagnosis not present

## 2015-12-20 DIAGNOSIS — M79602 Pain in left arm: Secondary | ICD-10-CM | POA: Insufficient documentation

## 2015-12-20 DIAGNOSIS — R42 Dizziness and giddiness: Secondary | ICD-10-CM | POA: Diagnosis not present

## 2015-12-20 DIAGNOSIS — I10 Essential (primary) hypertension: Secondary | ICD-10-CM | POA: Insufficient documentation

## 2015-12-20 DIAGNOSIS — Z87891 Personal history of nicotine dependence: Secondary | ICD-10-CM | POA: Insufficient documentation

## 2015-12-20 DIAGNOSIS — R0789 Other chest pain: Secondary | ICD-10-CM | POA: Insufficient documentation

## 2015-12-20 LAB — MYOCARDIAL PERFUSION IMAGING
CHL CUP NUCLEAR SDS: 0
CHL CUP NUCLEAR SRS: 2
LHR: 0.28
LV sys vol: 42 mL
LVDIAVOL: 99 mL
Peak HR: 65 {beats}/min
Rest HR: 57 {beats}/min
SSS: 2
TID: 1.09

## 2015-12-20 MED ORDER — TECHNETIUM TC 99M SESTAMIBI GENERIC - CARDIOLITE
10.3000 | Freq: Once | INTRAVENOUS | Status: AC | PRN
  Administered 2015-12-20: 10 via INTRAVENOUS

## 2015-12-20 MED ORDER — REGADENOSON 0.4 MG/5ML IV SOLN
0.4000 mg | Freq: Once | INTRAVENOUS | Status: AC
  Administered 2015-12-20: 0.4 mg via INTRAVENOUS

## 2015-12-20 MED ORDER — TECHNETIUM TC 99M SESTAMIBI GENERIC - CARDIOLITE
32.8000 | Freq: Once | INTRAVENOUS | Status: AC | PRN
  Administered 2015-12-20: 32.8 via INTRAVENOUS

## 2015-12-21 ENCOUNTER — Other Ambulatory Visit: Payer: Self-pay

## 2015-12-21 DIAGNOSIS — R079 Chest pain, unspecified: Secondary | ICD-10-CM

## 2015-12-21 DIAGNOSIS — R42 Dizziness and giddiness: Secondary | ICD-10-CM

## 2015-12-25 ENCOUNTER — Telehealth: Payer: Self-pay | Admitting: Family Medicine

## 2015-12-25 DIAGNOSIS — R42 Dizziness and giddiness: Secondary | ICD-10-CM

## 2015-12-25 NOTE — Telephone Encounter (Signed)
Caller name: Self  Can be reached: [phone]  Reason for call: PAtient has questions about being referred to Cardiologist. Plse Adv

## 2015-12-26 DIAGNOSIS — H401133 Primary open-angle glaucoma, bilateral, severe stage: Secondary | ICD-10-CM | POA: Diagnosis not present

## 2015-12-26 DIAGNOSIS — H2513 Age-related nuclear cataract, bilateral: Secondary | ICD-10-CM | POA: Diagnosis not present

## 2015-12-26 DIAGNOSIS — H04123 Dry eye syndrome of bilateral lacrimal glands: Secondary | ICD-10-CM | POA: Diagnosis not present

## 2015-12-26 NOTE — Telephone Encounter (Signed)
Message left to call the office.    KP 

## 2015-12-27 NOTE — Telephone Encounter (Signed)
message left to call the office     KP

## 2015-12-28 NOTE — Telephone Encounter (Signed)
Message left to call the office.    KP 

## 2015-12-31 ENCOUNTER — Encounter: Payer: Self-pay | Admitting: Gastroenterology

## 2016-01-07 NOTE — Telephone Encounter (Signed)
He is also having L arm pain and chest pain--- cardiology should be first  I will also put in neuro if he wants to see them--- but we need to r/o his heart

## 2016-01-07 NOTE — Telephone Encounter (Signed)
Pt called back. Best # [phone].

## 2016-01-07 NOTE — Telephone Encounter (Signed)
Spoke with patient and he stated his niece who is a doctor suggested that he sees Neuro instead of cardiology for his dizziness. Please advise    KP

## 2016-01-08 NOTE — Addendum Note (Signed)
Addended by: Ewing Schlein on: 01/08/2016 02:04 PM   Modules accepted: Orders

## 2016-01-08 NOTE — Telephone Encounter (Signed)
Patient has been made aware and he has agreed to see both specialist.     KP

## 2016-01-22 ENCOUNTER — Encounter: Payer: Self-pay | Admitting: Cardiovascular Disease

## 2016-01-22 ENCOUNTER — Ambulatory Visit (INDEPENDENT_AMBULATORY_CARE_PROVIDER_SITE_OTHER): Payer: Medicare Other | Admitting: Cardiovascular Disease

## 2016-01-22 VITALS — BP 122/76 | HR 68 | Ht 66.0 in | Wt 182.4 lb

## 2016-01-22 DIAGNOSIS — R42 Dizziness and giddiness: Secondary | ICD-10-CM | POA: Diagnosis not present

## 2016-01-22 NOTE — Progress Notes (Signed)
Cardiology Office Note   Date:  01/22/2016   ID:  [name] Gibson, [name] Gibson 11/24/1941, MRN [id number]  PCP:  Garnet Koyanagi, DO  Cardiologist:   Acie Fredrickson Wonda Cheng, MD   Chief Complaint  Patient presents with  . Dizziness   Problem list 1. Lightheadedness 2. Hypertension 3. Hyperlipidemia   History of Present Illness: [name] Gibson is a 75 y.o. male who presents for evaluation of dizziness. He has history of atypical chest pain. He had a stress Myoview study recently that was normal. He has no evidence of ischemia and he has normal left ventricular systolic function.  Woke up on Dec. 23 with dizziness.  Lasted 1 hour.   Went away.  Was seen by his primary medical doctor .  Had a stress myoview - negative for ischemia and normal LV function ( 58%)   Has not had any dizziness since Dec. 25.   Also had some left shoulder soreness at that same time but none since.  Walks daily  - between 1 1/2 - 2 hours  ( at least 5-6 miles a day )  - no CP with that  Is retired from the [employer] ( [employer])    Past Medical History  Diagnosis Date  . Hyperlipidemia   . Hypertension   . Cancer [facility])     prostate  . HYPERLIPIDEMIA 03/23/2007    Qualifier: Diagnosis of  By: Jerold Coombe    . HYPERTENSION 09/08/2007    Qualifier: Diagnosis of  By: Jerold Coombe    . COLONIC POLYPS 10/22/2007    Qualifier: Diagnosis of  By: Jerold Coombe    . HYPERTHYROIDISM 03/23/2007    Qualifier: Diagnosis of  By: Jerold Coombe    . GLAUCOMA 03/23/2007    Qualifier: Diagnosis of  By: Jerold Coombe    . SINUSITIS- ACUTE-NOS 09/12/2008    Qualifier: Diagnosis of  By: Jerold Coombe    . URI 12/13/2008    Qualifier: Diagnosis of  By: Jerold Coombe    . DIVERTICULOSIS OF COLON 10/22/2007    Qualifier: Diagnosis of  By: Jerold Coombe    . Sebaceous cyst 12/13/2008    Qualifier: Diagnosis of  By: Jerold Coombe    . ANKLE PAIN, RIGHT 11/16/2008    Qualifier: Diagnosis of  By: Jerold Coombe    . PSA, INCREASED 08/08/2009    Qualifier: Diagnosis of  By: Jerold Coombe    . ANGIOEDEMA 08/25/2007    Qualifier: Diagnosis of  By: Jerold Coombe    . PROSTATE CANCER, HX OF 10/02/2010    Qualifier: Diagnosis of  By: Jerold Coombe    . GLUCOSE INTOLERANCE, HX OF 03/23/2007    Qualifier: Diagnosis of  By: Jerold Coombe    . Obesity (BMI 30-39.9) 12/07/2013  . Right shoulder pain 02/08/2015    Past Surgical History  Procedure Laterality Date  . Appendectomy    . Radioactive seed implation      prostate     Current Outpatient Prescriptions  Medication Sig Dispense Refill  . amLODipine (NORVASC) 10 MG tablet TAKE 1 TABLET BY MOUTH DAILY 90 tablet 3  . atorvastatin (LIPITOR) 40 MG tablet TAKE 1 TABLET BY MOUTH AT BEDTIME 30 tablet 5  . hydrochlorothiazide (MICROZIDE) 12.5 MG capsule TAKE ONE CAPSULE BY MOUTH DAILY 90 capsule 1  . LUMIGAN 0.01 % SOLN Place 1 drop into both eyes at bedtime.   4  .  metoprolol (TOPROL-XL) 200 MG 24 hr tablet TAKE 1 TABLET BY MOUTH DAILY 90 tablet 1  . nystatin (MYCOSTATIN) 100000 UNIT/ML suspension 5 ml swish and spit qid 150 mL 0  . predniSONE (DELTASONE) 10 MG tablet 3 po qd for 3 days then 2 po qd for 3 days the 1 po qd for 3 days 18 tablet 0   No current facility-administered medications for this visit.    Allergies:   Penicillins    Social History:  The patient  reports that he has never smoked. He does not have any smokeless tobacco history on file. He reports that he drinks alcohol. He reports that he does not use illicit drugs.   Family History:  The patient's family history includes Cancer in his father and sister; Coronary artery disease in his other; Diabetes in his other; Hyperlipidemia in his other; Hypertension in his other.    ROS:  Please see the history of present illness.    Review of Systems: Constitutional:  denies fever, chills, diaphoresis, appetite change and fatigue.  HEENT: denies photophobia, eye pain,  redness, hearing loss, ear pain, congestion, sore throat, rhinorrhea, sneezing, neck pain, neck stiffness and tinnitus.  Respiratory: denies SOB, DOE, cough, chest tightness, and wheezing.  Cardiovascular: denies chest pain, palpitations and leg swelling.  Gastrointestinal: denies nausea, vomiting, abdominal pain, diarrhea, constipation, blood in stool.  Genitourinary: denies dysuria, urgency, frequency, hematuria, flank pain and difficulty urinating.  Musculoskeletal: denies  myalgias, back pain, joint swelling, arthralgias and gait problem.   Skin: denies pallor, rash and wound.  Neurological: denies dizziness, seizures, syncope, weakness, light-headedness, numbness and headaches.   Hematological: denies adenopathy, easy bruising, personal or family bleeding history.  Psychiatric/ Behavioral: denies suicidal ideation, mood changes, confusion, nervousness, sleep disturbance and agitation.       All other systems are reviewed and negative.    PHYSICAL EXAM: VS:  BP 122/76 mmHg  Pulse 68  Ht 5\' 6"  (1.676 m)  Wt 182 lb 6.4 oz (82.736 kg)  BMI 29.45 kg/m2 , BMI Body mass index is 29.45 kg/(m^2). GEN: Well nourished, well developed, in no acute distress HEENT: normal Neck: no JVD, carotid bruits, or masses Cardiac: RRR; no murmurs, rubs, or gallops,no edema  Respiratory:  clear to auscultation bilaterally, normal work of breathing GI: soft, nontender, nondistended, + BS MS: no deformity or atrophy Skin: warm and dry, no rash Neuro:  Strength and sensation are intact Psych: normal   EKG:  EKG is not ordered today. The ekg ordered DEc. 27 demonstrates NSR at 60.  1st degree  AVB.   No ST or T wave abn    Recent Labs: No results found for requested labs within last 365 days.    Lipid Panel    Component Value Date/Time   CHOL 172 12/14/2014 1057   TRIG 81.0 12/14/2014 1057   HDL 46.30 12/14/2014 1057   CHOLHDL 4 12/14/2014 1057   VLDL 16.2 12/14/2014 1057   LDLCALC 110*  12/14/2014 1057   LDLDIRECT 173.4 03/16/2013 1023      Wt Readings from Last 3 Encounters:  01/22/16 182 lb 6.4 oz (82.736 kg)  12/20/15 181 lb (82.101 kg)  12/04/15 181 lb 6.4 oz (82.283 kg)      Other studies Reviewed: Additional studies/ records that were reviewed today include: . Review of the above records demonstrates:    ASSESSMENT AND PLAN:  1.  Dizziness: Mr. Alison Gibson has no symptoms of chest pain or shortness breath. He had one episode  of dizziness in late December but has not had any recurrent episodes. His blood pressure and heart rate are well controlled.  He's had a normal stress Myoview study. He is very healthy and walks up proximally 6 miles every day.  At this point I do not think he needs any further evaluation. He has a normal cardiac exam and is extremely high functioning. At this point, I can see him on as needed basis.      Current medicines are reviewed at length with the patient today.  The patient does not have concerns regarding medicines.  The following changes have been made:  no change  Labs/ tests ordered today include:  No orders of the defined types were placed in this encounter.     Disposition:   FU with me as needed.      Orah Sonnen, Wonda Cheng, MD  01/22/2016 2:56 PM    [place], [place], [place]  [address] Phone: [phone]; Fax: [phone]   [facility]  [address] [place], [place]  [address] [phone]   Fax [phone]

## 2016-01-22 NOTE — Patient Instructions (Signed)

## 2016-02-07 ENCOUNTER — Encounter: Payer: Self-pay | Admitting: Neurology

## 2016-02-07 ENCOUNTER — Ambulatory Visit (INDEPENDENT_AMBULATORY_CARE_PROVIDER_SITE_OTHER): Payer: Medicare Other | Admitting: Neurology

## 2016-02-07 VITALS — BP 132/74 | HR 64 | Ht 66.0 in | Wt 184.0 lb

## 2016-02-07 DIAGNOSIS — R51 Headache: Secondary | ICD-10-CM | POA: Diagnosis not present

## 2016-02-07 DIAGNOSIS — R42 Dizziness and giddiness: Secondary | ICD-10-CM | POA: Diagnosis not present

## 2016-02-07 DIAGNOSIS — R519 Headache, unspecified: Secondary | ICD-10-CM

## 2016-02-07 DIAGNOSIS — R251 Tremor, unspecified: Secondary | ICD-10-CM | POA: Diagnosis not present

## 2016-02-07 NOTE — Patient Instructions (Signed)
1.  Will get MRI of brain without contrast 2.  Will call you with results and any further testing if needed 3.  Otherwise, monitor tremor and follow up as needed.

## 2016-02-07 NOTE — Progress Notes (Signed)
NEUROLOGY CONSULTATION NOTE  [name] Gibson MRN: [id number] DOB: 28-Mar-1941  Referring provider: Dr. Etter Sjogren Primary care provider: Dr. Etter Sjogren  Reason for consult:  dizziness  HISTORY OF PRESENT ILLNESS: [name] Gibson is a 75 year old right-handed male with hypertension, hyperlipidemia and atypical chest pain who presents for dizziness.  History obtained by patient and PCP note and cardiology note.  Stress myoview report reviewed.  On 11/30/15, he woke up with occipital headache lasting 2-3 minutes, followed up vertigo lasting 2-3 minutes and resolved.  There was no vision loss, diplopia, ataxia or focal numbness or weakness  He underwent a cardiac workup for atypical chest pain.  Stress myoview demonstrated LVEF 58% with no ischemia.  About a week ago, he had a similar episode of vertigo, also lasting 3 minutes.  Also, for the past 3 months, he reports tremor in his right hand sometimes when he writes.  There is no family history of tremor.  He denies problems with ambulation or standing up.  PAST MEDICAL HISTORY: Past Medical History  Diagnosis Date  . Hyperlipidemia   . Hypertension   . Cancer [facility])     prostate  . HYPERLIPIDEMIA 03/23/2007    Qualifier: Diagnosis of  By: Jerold Coombe    . HYPERTENSION 09/08/2007    Qualifier: Diagnosis of  By: Jerold Coombe    . COLONIC POLYPS 10/22/2007    Qualifier: Diagnosis of  By: Jerold Coombe    . HYPERTHYROIDISM 03/23/2007    Qualifier: Diagnosis of  By: Jerold Coombe    . GLAUCOMA 03/23/2007    Qualifier: Diagnosis of  By: Jerold Coombe    . SINUSITIS- ACUTE-NOS 09/12/2008    Qualifier: Diagnosis of  By: Jerold Coombe    . URI 12/13/2008    Qualifier: Diagnosis of  By: Jerold Coombe    . DIVERTICULOSIS OF COLON 10/22/2007    Qualifier: Diagnosis of  By: Jerold Coombe    . Sebaceous cyst 12/13/2008    Qualifier: Diagnosis of  By: Jerold Coombe    . ANKLE PAIN, RIGHT 11/16/2008    Qualifier: Diagnosis of  By:  Jerold Coombe    . PSA, INCREASED 08/08/2009    Qualifier: Diagnosis of  By: Jerold Coombe    . ANGIOEDEMA 08/25/2007    Qualifier: Diagnosis of  By: Jerold Coombe    . PROSTATE CANCER, HX OF 10/02/2010    Qualifier: Diagnosis of  By: Jerold Coombe    . GLUCOSE INTOLERANCE, HX OF 03/23/2007    Qualifier: Diagnosis of  By: Jerold Coombe    . Obesity (BMI 30-39.9) 12/07/2013  . Right shoulder pain 02/08/2015    PAST SURGICAL HISTORY: Past Surgical History  Procedure Laterality Date  . Appendectomy    . Radioactive seed implation      prostate    MEDICATIONS: Current Outpatient Prescriptions on File Prior to Visit  Medication Sig Dispense Refill  . amLODipine (NORVASC) 10 MG tablet TAKE 1 TABLET BY MOUTH DAILY 90 tablet 3  . atorvastatin (LIPITOR) 40 MG tablet TAKE 1 TABLET BY MOUTH AT BEDTIME 30 tablet 5  . hydrochlorothiazide (MICROZIDE) 12.5 MG capsule TAKE ONE CAPSULE BY MOUTH DAILY 90 capsule 1  . LUMIGAN 0.01 % SOLN Place 1 drop into both eyes at bedtime.   4  . metoprolol (TOPROL-XL) 200 MG 24 hr tablet TAKE 1 TABLET BY MOUTH DAILY 90 tablet 1  . nystatin (MYCOSTATIN) 100000 UNIT/ML suspension  5 ml swish and spit qid 150 mL 0   No current facility-administered medications on file prior to visit.    ALLERGIES: Allergies  Allergen Reactions  . Penicillins     REACTION: unspecified    FAMILY HISTORY: Family History  Problem Relation Age of Onset  . Cancer Father     spine  . Cancer Sister     pancrease  . Diabetes Other   . Hyperlipidemia Other   . Hypertension Other   . Coronary artery disease Other     SOCIAL HISTORY: Social History   Social History  . Marital Status: Divorced    Spouse Name: N/A  . Number of Children: N/A  . Years of Education: N/A   Occupational History  . Salesman    Social History Main Topics  . Smoking status: Never Smoker   . Smokeless tobacco: Not on file  . Alcohol Use: 0.0 oz/week    0 Standard drinks or  equivalent per week  . Drug Use: No  . Sexual Activity:    Partners: Female   Other Topics Concern  . Not on file   Social History Narrative   Reg exercise--walking 2-3 x a week    REVIEW OF SYSTEMS: Constitutional: No fevers, chills, or sweats, no generalized fatigue, change in appetite Eyes: No visual changes, double vision, eye pain Ear, nose and throat: No hearing loss, ear pain, nasal congestion, sore throat Cardiovascular: No chest pain, palpitations Respiratory:  No shortness of breath at rest or with exertion, wheezes GastrointestinaI: No nausea, vomiting, diarrhea, abdominal pain, fecal incontinence Genitourinary:  No dysuria, urinary retention or frequency Musculoskeletal:  No neck pain, back pain Integumentary: No rash, pruritus, skin lesions Neurological: as above Psychiatric: No depression, insomnia, anxiety Endocrine: No palpitations, fatigue, diaphoresis, mood swings, change in appetite, change in weight, increased thirst Hematologic/Lymphatic:  No anemia, purpura, petechiae. Allergic/Immunologic: no itchy/runny eyes, nasal congestion, recent allergic reactions, rashes  PHYSICAL EXAM: Filed Vitals:   02/07/16 0912  BP: 132/74  Pulse: 64   General: No acute distress.  Patient appears well-groomed.  Head:  Normocephalic/atraumatic Eyes:  fundi unremarkable, without vessel changes, exudates, hemorrhages or papilledema. Neck: supple, no paraspinal tenderness, full range of motion Back: No paraspinal tenderness Heart: regular rate and rhythm Lungs: Clear to auscultation bilaterally. Vascular: No carotid bruits. Neurological Exam: Mental status: alert and oriented to person, place, and time, recent and remote memory intact, fund of knowledge intact, attention and concentration intact, speech fluent and not dysarthric, language intact. Cranial nerves: CN I: not tested CN II: pupils equal, round and reactive to light, visual fields intact, fundi unremarkable,  without vessel changes, exudates, hemorrhages or papilledema. CN III, IV, VI:  full range of motion, no nystagmus, no ptosis CN V: facial sensation intact CN VII: upper and lower face symmetric CN VIII: hearing intact CN IX, X: gag intact, uvula midline CN XI: sternocleidomastoid and trapezius muscles intact CN XII: tongue midline Bulk & Tone: normal, no fasciculations.  No rigidity or cogwheeling. Motor:  5/5 throughout.  Right mixed resting, postural and kinetic tremor.  Mild postural and kinetic tremor in left hand.   Sensation: temperature and vibration sensation intact. Deep Tendon Reflexes:  1+ throughout, toes downgoing. Finger to nose testing:  Without dysmetria.  Heel to shin:  Without dysmetria.  Gait:  Normal station and stride.  Able to turn and tandem walk. Romberg negative.  IMPRESSION: Episode of vertigo with occipital headache.  Unclear etiology Tremor.  Possibly essential tremor despite  resting component.  He does not exhibit any other symptoms for Parkinson disease  PLAN: MRI of brain.  Further recommendations pending results Monitor tremor for now, as it does not bother him  Thank you for allowing me to take part in the care of this patient.  Metta Clines, DO  CC: Garnet Koyanagi, DO

## 2016-02-14 ENCOUNTER — Ambulatory Visit
Admission: RE | Admit: 2016-02-14 | Discharge: 2016-02-14 | Disposition: A | Discharge status: Home / Self Care | Payer: Medicare Other | Source: Ambulatory Visit | Attending: Neurology | Admitting: Neurology

## 2016-02-14 DIAGNOSIS — R42 Dizziness and giddiness: Secondary | ICD-10-CM

## 2016-02-14 DIAGNOSIS — R51 Headache: Secondary | ICD-10-CM

## 2016-02-14 DIAGNOSIS — R519 Headache, unspecified: Secondary | ICD-10-CM

## 2016-02-15 ENCOUNTER — Telehealth: Payer: Self-pay

## 2016-02-15 NOTE — Telephone Encounter (Signed)
Message relayed to patient. Verbalized understanding and denied questions.   

## 2016-02-15 NOTE — Telephone Encounter (Signed)
-----   Message from Pieter Partridge, DO sent at 02/14/2016  4:23 PM EST ----- MRI shows nothing concerning

## 2016-02-25 ENCOUNTER — Other Ambulatory Visit: Payer: Self-pay | Admitting: Family Medicine

## 2016-02-26 NOTE — Telephone Encounter (Signed)
Medication filled to pharmacy as requested.   

## 2016-03-04 DIAGNOSIS — Z0000 Encounter for general adult medical examination without abnormal findings: Secondary | ICD-10-CM | POA: Diagnosis not present

## 2016-03-04 DIAGNOSIS — R972 Elevated prostate specific antigen [PSA]: Secondary | ICD-10-CM | POA: Diagnosis not present

## 2016-03-04 DIAGNOSIS — C61 Malignant neoplasm of prostate: Secondary | ICD-10-CM | POA: Diagnosis not present

## 2016-03-12 DIAGNOSIS — L910 Hypertrophic scar: Secondary | ICD-10-CM | POA: Diagnosis not present

## 2016-03-26 ENCOUNTER — Telehealth: Payer: Self-pay | Admitting: *Deleted

## 2016-03-26 NOTE — Telephone Encounter (Signed)
Unable to reach patient at time of pre-visit call. Left message for patient to return call when available.  

## 2016-03-27 ENCOUNTER — Encounter: Payer: Self-pay | Admitting: Family Medicine

## 2016-03-27 ENCOUNTER — Ambulatory Visit (INDEPENDENT_AMBULATORY_CARE_PROVIDER_SITE_OTHER): Payer: Medicare Other | Admitting: Family Medicine

## 2016-03-27 VITALS — BP 142/78 | HR 59 | Temp 98.1°F | Ht 65.0 in | Wt 180.0 lb

## 2016-03-27 DIAGNOSIS — E785 Hyperlipidemia, unspecified: Secondary | ICD-10-CM

## 2016-03-27 DIAGNOSIS — Z0000 Encounter for general adult medical examination without abnormal findings: Secondary | ICD-10-CM | POA: Diagnosis not present

## 2016-03-27 DIAGNOSIS — Z8546 Personal history of malignant neoplasm of prostate: Secondary | ICD-10-CM

## 2016-03-27 DIAGNOSIS — I10 Essential (primary) hypertension: Secondary | ICD-10-CM

## 2016-03-27 LAB — POCT URINALYSIS DIPSTICK
BILIRUBIN UA: NEGATIVE
GLUCOSE UA: NEGATIVE
KETONES UA: NEGATIVE
Leukocytes, UA: NEGATIVE
Nitrite, UA: NEGATIVE
PH UA: 6.5
Protein, UA: NEGATIVE
RBC UA: NEGATIVE
Spec Grav, UA: 1.015
Urobilinogen, UA: 0.2

## 2016-03-27 NOTE — Progress Notes (Signed)
Patient ID: [name] Gibson, male    DOB: 1940/12/09  Age: 75 y.o. MRN: [id number]    Subjective:  Subjective HPI [name] Gibson presents for cpe.  No complaints.    Review of Systems  Constitutional: Negative.   HENT: Negative for congestion, ear pain, hearing loss, nosebleeds, postnasal drip, rhinorrhea, sinus pressure, sneezing and tinnitus.   Eyes: Negative for photophobia, discharge, itching and visual disturbance.  Respiratory: Negative.   Cardiovascular: Negative.   Gastrointestinal: Negative for abdominal pain, constipation, blood in stool, abdominal distention and anal bleeding.  Endocrine: Negative.   Genitourinary: Negative.   Musculoskeletal: Negative.   Skin: Negative.   Allergic/Immunologic: Negative.   Neurological: Negative for dizziness, weakness, light-headedness, numbness and headaches.  Psychiatric/Behavioral: Negative for suicidal ideas, confusion, sleep disturbance, dysphoric mood, decreased concentration and agitation. The patient is not nervous/anxious.     History Past Medical History  Diagnosis Date  . Hyperlipidemia   . Hypertension   . Cancer [facility])     prostate  . HYPERLIPIDEMIA 03/23/2007    Qualifier: Diagnosis of  By: Jerold Coombe    . HYPERTENSION 09/08/2007    Qualifier: Diagnosis of  By: Jerold Coombe    . COLONIC POLYPS 10/22/2007    Qualifier: Diagnosis of  By: Jerold Coombe    . HYPERTHYROIDISM 03/23/2007    Qualifier: Diagnosis of  By: Jerold Coombe    . GLAUCOMA 03/23/2007    Qualifier: Diagnosis of  By: Jerold Coombe    . SINUSITIS- ACUTE-NOS 09/12/2008    Qualifier: Diagnosis of  By: Jerold Coombe    . URI 12/13/2008    Qualifier: Diagnosis of  By: Jerold Coombe    . DIVERTICULOSIS OF COLON 10/22/2007    Qualifier: Diagnosis of  By: Jerold Coombe    . Sebaceous cyst 12/13/2008    Qualifier: Diagnosis of  By: Jerold Coombe    . ANKLE PAIN, RIGHT 11/16/2008    Qualifier: Diagnosis of  By: Jerold Coombe    . PSA,  INCREASED 08/08/2009    Qualifier: Diagnosis of  By: Jerold Coombe    . ANGIOEDEMA 08/25/2007    Qualifier: Diagnosis of  By: Jerold Coombe    . PROSTATE CANCER, HX OF 10/02/2010    Qualifier: Diagnosis of  By: Jerold Coombe    . GLUCOSE INTOLERANCE, HX OF 03/23/2007    Qualifier: Diagnosis of  By: Jerold Coombe    . Obesity (BMI 30-39.9) 12/07/2013  . Right shoulder pain 02/08/2015    He has past surgical history that includes Appendectomy and radioactive seed implation.   His family history includes Cancer in his father and sister; Coronary artery disease in his other; Diabetes in his other; Hyperlipidemia in his other; Hypertension in his other.He reports that he has never smoked. He does not have any smokeless tobacco history on file. He reports that he drinks alcohol. He reports that he does not use illicit drugs.  Current Outpatient Prescriptions on File Prior to Visit  Medication Sig Dispense Refill  . LUMIGAN 0.01 % SOLN Place 1 drop into both eyes at bedtime.   4  . nystatin (MYCOSTATIN) 100000 UNIT/ML suspension 5 ml swish and spit qid 150 mL 0   No current facility-administered medications on file prior to visit.     Objective:  Objective Physical Exam  Constitutional: He is oriented to person, place, and time. He appears well-developed and well-nourished. No distress.  HENT:  Head:  Normocephalic and atraumatic.  Right Ear: External ear normal.  Left Ear: External ear normal.  Nose: Nose normal.  Mouth/Throat: Oropharynx is clear and moist. No oropharyngeal exudate.  Eyes: Conjunctivae and EOM are normal. Pupils are equal, round, and reactive to light. Right eye exhibits no discharge. Left eye exhibits no discharge.  Neck: Normal range of motion. Neck supple. No JVD present. No thyromegaly present.  Cardiovascular: Normal rate, regular rhythm and intact distal pulses.  Exam reveals no gallop and no friction rub.   No murmur heard. Pulmonary/Chest: Effort normal  and breath sounds normal. No respiratory distress. He has no wheezes. He has no rales. He exhibits no tenderness.  Abdominal: Soft. Bowel sounds are normal. He exhibits no distension and no mass. There is no tenderness. There is no rebound and no guarding.  Genitourinary:  Deferred-- urology  Musculoskeletal: Normal range of motion. He exhibits no edema or tenderness.  Lymphadenopathy:    He has no cervical adenopathy.  Neurological: He is alert and oriented to person, place, and time. He displays normal reflexes. He exhibits normal muscle tone.  Skin: Skin is warm and dry. No rash noted. He is not diaphoretic. No erythema. No pallor.  Psychiatric: He has a normal mood and affect. His behavior is normal. Judgment and thought content normal.  Nursing note and vitals reviewed.  BP 142/78 mmHg  Pulse 59  Temp(Src) 98.1 F (36.7 C) (Oral)  Ht 5\' 5"  (1.651 m)  Wt 180 lb (81.647 kg)  BMI 29.95 kg/m2  SpO2 96% Wt Readings from Last 3 Encounters:  03/27/16 180 lb (81.647 kg)  02/07/16 184 lb (83.462 kg)  01/22/16 182 lb 6.4 oz (82.736 kg)     Lab Results  Component Value Date   WBC 4.5 03/27/2016   HGB 14.7 03/27/2016   HCT 43.7 03/27/2016   PLT 192.0 03/27/2016   GLUCOSE 115* 03/27/2016   CHOL 156 03/27/2016   TRIG 61.0 03/27/2016   HDL 64.40 03/27/2016   LDLDIRECT 173.4 03/16/2013   LDLCALC 80 03/27/2016   ALT 43 03/27/2016   AST 32 03/27/2016   NA 140 03/27/2016   K 3.8 03/27/2016   CL 103 03/27/2016   CREATININE 1.01 03/27/2016   BUN 16 03/27/2016   CO2 28 03/27/2016   TSH 2.28 12/14/2014   PSA 0.16 12/14/2014   INR 1.03 12/12/2009   HGBA1C 6.1 03/16/2013   MICROALBUR 0.6 12/14/2014    Mr Brain Wo Contrast  02/14/2016  CLINICAL DATA:  Patient woke up with dizziness. Similar episode December 2016. EXAM: MRI HEAD WITHOUT CONTRAST TECHNIQUE: Multiplanar, multiecho pulse sequences of the brain and surrounding structures were obtained without intravenous contrast.  COMPARISON:  None. FINDINGS: No evidence for acute infarction, hemorrhage, mass lesion, hydrocephalus, or extra-axial fluid. Global atrophy. Mild to moderate subcortical and periventricular T2 and FLAIR hyperintensities, likely chronic microvascular ischemic change. Pituitary, pineal, and cerebellar tonsils unremarkable. No upper cervical lesions. Flow voids are maintained throughout the carotid, basilar, and vertebral arteries. There are no areas of chronic hemorrhage. Visualized calvarium, skull base, and upper cervical osseous structures unremarkable. Scalp and extracranial soft tissues, orbits, sinuses, and mastoids show no acute process. IMPRESSION: Atrophy and small vessel disease.  No acute intracranial findings. Electronically Signed   By: Staci Righter M.D.   On: 02/14/2016 13:43     Assessment & Plan:  Plan I have changed Mr. Christinia Gully metoprolol, hydrochlorothiazide, atorvastatin, and amLODipine. I am also having him maintain his LUMIGAN and nystatin.  Meds ordered this  encounter  Medications  . metoprolol (TOPROL-XL) 200 MG 24 hr tablet    Sig: Take 1 tablet (200 mg total) by mouth daily.    Dispense:  90 tablet    Refill:  0  . hydrochlorothiazide (MICROZIDE) 12.5 MG capsule    Sig: Take 1 capsule (12.5 mg total) by mouth daily.    Dispense:  90 capsule    Refill:  1  . atorvastatin (LIPITOR) 40 MG tablet    Sig: Take 1 tablet (40 mg total) by mouth at bedtime.    Dispense:  30 tablet    Refill:  5  . amLODipine (NORVASC) 10 MG tablet    Sig: Take 1 tablet (10 mg total) by mouth daily.    Dispense:  90 tablet    Refill:  0    Problem List Items Addressed This Visit      Unprioritized   Hyperlipidemia    Check labs con't meds      Relevant Medications   metoprolol (TOPROL-XL) 200 MG 24 hr tablet   hydrochlorothiazide (MICROZIDE) 12.5 MG capsule   atorvastatin (LIPITOR) 40 MG tablet   amLODipine (NORVASC) 10 MG tablet   Other Relevant Orders   Comprehensive  metabolic panel (Completed)   CBC with Differential/Platelet (Completed)   Lipid panel (Completed)   POCT urinalysis dipstick (Completed)   Essential hypertension    Stable con't meds      Relevant Medications   metoprolol (TOPROL-XL) 200 MG 24 hr tablet   hydrochlorothiazide (MICROZIDE) 12.5 MG capsule   atorvastatin (LIPITOR) 40 MG tablet   amLODipine (NORVASC) 10 MG tablet   Other Relevant Orders   Comprehensive metabolic panel (Completed)   CBC with Differential/Platelet (Completed)   Lipid panel (Completed)   POCT urinalysis dipstick (Completed)   PROSTATE CANCER, HX OF    Per urology      Preventative health care - Primary      Follow-up: Return in about 6 months (around 09/26/2016), or if symptoms worsen or fail to improve, for hypertension, hyperlipidemia.  [name], DO

## 2016-03-27 NOTE — Patient Instructions (Signed)

## 2016-03-27 NOTE — Progress Notes (Signed)
Pre visit review using our clinic review tool, if applicable. No additional management support is needed unless otherwise documented below in the visit note. 

## 2016-03-28 LAB — CBC WITH DIFFERENTIAL/PLATELET
BASOS ABS: 0 10*3/uL (ref 0.0–0.1)
Basophils Relative: 0.8 % (ref 0.0–3.0)
EOS ABS: 0 10*3/uL (ref 0.0–0.7)
Eosinophils Relative: 0.9 % (ref 0.0–5.0)
HEMATOCRIT: 43.7 % (ref 39.0–52.0)
HEMOGLOBIN: 14.7 g/dL (ref 13.0–17.0)
LYMPHS PCT: 33.1 % (ref 12.0–46.0)
Lymphs Abs: 1.5 10*3/uL (ref 0.7–4.0)
MCHC: 33.8 g/dL (ref 30.0–36.0)
MCV: 95.1 fl (ref 78.0–100.0)
MONOS PCT: 14.4 % — AB (ref 3.0–12.0)
Monocytes Absolute: 0.6 10*3/uL (ref 0.1–1.0)
Neutro Abs: 2.3 10*3/uL (ref 1.4–7.7)
Neutrophils Relative %: 50.8 % (ref 43.0–77.0)
Platelets: 192 10*3/uL (ref 150.0–400.0)
RBC: 4.59 Mil/uL (ref 4.22–5.81)
RDW: 14.4 % (ref 11.5–15.5)
WBC: 4.5 10*3/uL (ref 4.0–10.5)

## 2016-03-28 LAB — LIPID PANEL
CHOL/HDL RATIO: 2
Cholesterol: 156 mg/dL (ref 0–200)
HDL: 64.4 mg/dL (ref >39.00)
LDL Cholesterol: 80 mg/dL (ref 0–99)
NONHDL: 91.91
TRIGLYCERIDES: 61 mg/dL (ref 0.0–149.0)
VLDL: 12.2 mg/dL (ref 0.0–40.0)

## 2016-03-28 LAB — COMPREHENSIVE METABOLIC PANEL
ALBUMIN: 4.6 g/dL (ref 3.5–5.2)
ALK PHOS: 35 U/L — AB (ref 39–117)
ALT: 43 U/L (ref 0–53)
AST: 32 U/L (ref 0–37)
BILIRUBIN TOTAL: 0.6 mg/dL (ref 0.2–1.2)
BUN: 16 mg/dL (ref 6–23)
CALCIUM: 10 mg/dL (ref 8.4–10.5)
CO2: 28 meq/L (ref 19–32)
Chloride: 103 mEq/L (ref 96–112)
Creatinine, Ser: 1.01 mg/dL (ref 0.40–1.50)
GFR: 92.18 mL/min (ref >60.00)
Glucose, Bld: 115 mg/dL — ABNORMAL HIGH (ref 70–99)
Potassium: 3.8 mEq/L (ref 3.5–5.1)
Sodium: 140 mEq/L (ref 135–145)
Total Protein: 7.5 g/dL (ref 6.0–8.3)

## 2016-03-29 DIAGNOSIS — Z0000 Encounter for general adult medical examination without abnormal findings: Secondary | ICD-10-CM | POA: Insufficient documentation

## 2016-03-29 MED ORDER — METOPROLOL SUCCINATE ER 200 MG PO TB24: 200.0000 mg | ORAL_TABLET | Freq: Every day | ORAL | Status: DC

## 2016-03-29 MED ORDER — AMLODIPINE BESYLATE 10 MG PO TABS: 10.0000 mg | ORAL_TABLET | Freq: Every day | ORAL | Status: DC

## 2016-03-29 MED ORDER — ATORVASTATIN CALCIUM 40 MG PO TABS: 40.0000 mg | ORAL_TABLET | Freq: Every day | ORAL | Status: DC

## 2016-03-29 MED ORDER — HYDROCHLOROTHIAZIDE 12.5 MG PO CAPS: 12.5000 mg | ORAL_CAPSULE | Freq: Every day | ORAL | Status: DC

## 2016-03-29 NOTE — Assessment & Plan Note (Signed)
Per u rology 

## 2016-03-29 NOTE — Assessment & Plan Note (Signed)
Stable con't meds 

## 2016-03-29 NOTE — Assessment & Plan Note (Signed)
Check labs con't meds 

## 2016-04-28 ENCOUNTER — Other Ambulatory Visit: Payer: Self-pay | Admitting: Family Medicine

## 2016-05-28 ENCOUNTER — Other Ambulatory Visit: Payer: Self-pay | Admitting: Family Medicine

## 2016-05-28 NOTE — Telephone Encounter (Signed)
Rx sent to the pharmacy by e-script.//AB/CMA 

## 2016-06-02 ENCOUNTER — Other Ambulatory Visit: Payer: Self-pay | Admitting: Family Medicine

## 2016-06-02 NOTE — Telephone Encounter (Signed)
Rx sent to the pharmacy by e-script.//AB/CMA 

## 2016-06-28 ENCOUNTER — Other Ambulatory Visit: Payer: Self-pay | Admitting: Family Medicine

## 2016-06-30 NOTE — Telephone Encounter (Signed)
Medication filled to pharmacy as requested.   

## 2016-08-05 ENCOUNTER — Encounter: Payer: Self-pay | Admitting: *Deleted

## 2016-08-05 ENCOUNTER — Ambulatory Visit (INDEPENDENT_AMBULATORY_CARE_PROVIDER_SITE_OTHER): Payer: Medicare Other | Admitting: *Deleted

## 2016-08-05 VITALS — BP 138/72 | HR 66 | Resp 18 | Ht 65.0 in | Wt 183.3 lb

## 2016-08-05 DIAGNOSIS — Z0000 Encounter for general adult medical examination without abnormal findings: Secondary | ICD-10-CM

## 2016-08-05 NOTE — Progress Notes (Signed)
Pre visit review using our clinic review tool, if applicable. No additional management support is needed unless otherwise documented below in the visit note. 

## 2016-08-05 NOTE — Progress Notes (Signed)
Subjective:   [name] Gibson is a 75 y.o. male who presents for Medicare Annual/Subsequent preventive examination.  Review of Systems:  No ROS.  Medicare Wellness Visit.  Cardiac Risk Factors include: advanced age (>31men, >72 women);dyslipidemia;male gender;hypertension   Sleep patterns: No sleep issues. 7 hours nightly. Gets up once nightly or less to void.    Home Safety/Smoke Alarms: Lives alone, has a male friend who stays over sometimes. Feels safe in home. Has fire alarms in home.   Living environment; residence and Firearm Safety: Stored in a safe place. Seat Belt Safety/Bike Helmet: Wears seat belt.    Counseling:   Eye Exam- Dr. Venetia Maxon every 4-6 months Dental- sees dentist every 6 months, cannot remember name of provider  Male:   CCS- 10/05/07 diverticulosis, internal hemorrhoids, multiple polyps    PSA- Follows w/ Dr. Gaynelle Arabian  Lab Results  Component Value Date   PSA 0.16 12/14/2014   PSA 1.73 10/15/2011   PSA 4.07 (H) 08/08/2009      Objective:    Vitals: BP 138/72 (BP Location: Right Arm, Patient Position: Sitting, Cuff Size: Normal)   Pulse 66   Resp 18   Ht 5\' 5"  (1.651 m)   Wt 183 lb 4.8 oz (83.1 kg)   SpO2 97%   BMI 30.50 kg/m   Body mass index is 30.5 kg/m.  Tobacco History  Smoking Status  . Never Smoker  Smokeless Tobacco  . Never Used     Counseling given: Not Answered   Past Medical History:  Diagnosis Date  . ANGIOEDEMA 08/25/2007   Qualifier: Diagnosis of  By: Jerold Coombe    . ANKLE PAIN, RIGHT 11/16/2008   Qualifier: Diagnosis of  By: Jerold Coombe    . Cancer [facility])    prostate  . COLONIC POLYPS 10/22/2007   Qualifier: Diagnosis of  By: Jerold Coombe    . DIVERTICULOSIS OF COLON 10/22/2007   Qualifier: Diagnosis of  By: Jerold Coombe    . GLAUCOMA 03/23/2007   Qualifier: Diagnosis of  By: Jerold Coombe    . GLUCOSE INTOLERANCE, HX OF 03/23/2007   Qualifier: Diagnosis of  By: Jerold Coombe    .  Hyperlipidemia   . HYPERLIPIDEMIA 03/23/2007   Qualifier: Diagnosis of  By: Jerold Coombe    . Hypertension   . HYPERTENSION 09/08/2007   Qualifier: Diagnosis of  By: Jerold Coombe    . HYPERTHYROIDISM 03/23/2007   Qualifier: Diagnosis of  By: Jerold Coombe    . Obesity (BMI 30-39.9) 12/07/2013  . PROSTATE CANCER, HX OF 10/02/2010   Qualifier: Diagnosis of  By: Jerold Coombe    . PSA, INCREASED 08/08/2009   Qualifier: Diagnosis of  By: Jerold Coombe    . Right shoulder pain 02/08/2015  . Sebaceous cyst 12/13/2008   Qualifier: Diagnosis of  By: Jerold Coombe    . SINUSITIS- ACUTE-NOS 09/12/2008   Qualifier: Diagnosis of  By: Jerold Coombe    . URI 12/13/2008   Qualifier: Diagnosis of  By: Jerold Coombe     Past Surgical History:  Procedure Laterality Date  . APPENDECTOMY    . radioactive seed implation     prostate   Family History  Problem Relation Age of Onset  . Diabetes Other   . Hyperlipidemia Other   . Hypertension Other   . Coronary artery disease Other   . Cancer Father     spine  . Cancer  Sister     pancrease   History  Sexual Activity  . Sexual activity: Yes  . Partners: Female    Outpatient Encounter Prescriptions as of 08/05/2016  Medication Sig  . amLODipine (NORVASC) 10 MG tablet TAKE 1 TABLET BY MOUTH DAILY  . atorvastatin (LIPITOR) 40 MG tablet TAKE 1 TABLET BY MOUTH AT BEDTIME  . chlorhexidine (PERIDEX) 0.12 % solution daily.  . hydrochlorothiazide (MICROZIDE) 12.5 MG capsule TAKE ONE CAPSULE BY MOUTH DAILY  . LUMIGAN 0.01 % SOLN Place 1 drop into both eyes at bedtime.   . metoprolol (TOPROL-XL) 200 MG 24 hr tablet TAKE 1 TABLET BY MOUTH DAILY  . nystatin (MYCOSTATIN) 100000 UNIT/ML suspension 5 ml swish and spit qid  . [DISCONTINUED] amLODipine (NORVASC) 10 MG tablet Take 1 tablet (10 mg total) by mouth daily.  . [DISCONTINUED] metoprolol (TOPROL-XL) 200 MG 24 hr tablet Take 1 tablet (200 mg total) by mouth daily.   No  facility-administered encounter medications on file as of 08/05/2016.     Activities of Daily Living In your present state of health, do you have any difficulty performing the following activities: 08/05/2016  Hearing? N  Vision? N  Difficulty concentrating or making decisions? N  Walking or climbing stairs? N  Dressing or bathing? N  Doing errands, shopping? N  Preparing Food and eating ? N  Using the Toilet? N  In the past six months, have you accidently leaked urine? N  Do you have problems with loss of bowel control? N  Managing your Medications? N  Managing your Finances? N  Housekeeping or managing your Housekeeping? N  Some recent data might be hidden    Patient Care Team: [name], DO as PCP - General Carolan Clines, MD as Consulting Physician (Urology) Diane Dalphine Handing (Optometry) Marylynn Pearson, MD as Consulting Physician (Ophthalmology) Pieter Partridge, DO as Consulting Physician (Neurology) Thayer Headings, MD as Consulting Physician (Cardiology)   Assessment:    Physical assessment deferred to PCP.  Exercise Activities and Dietary recommendations Current Exercise Habits: Home exercise routine, Type of exercise: walking, Time (Minutes): > 60, Frequency (Times/Week): 4, Weekly Exercise (Minutes/Week): 0, Intensity: Moderate  Diet (meal preparation, eat out, water intake, caffeinated beverages, dairy products, fruits and vegetables): Chicken and fish. Cabbage and collard greens. Does not eat red meat. Usually eats at home. Eats 1-2x meals daily and snacks on fruit or cereal. Drinks 'a lot' of water. Eats raisin cookies.     Goals    . Patient Stated (pt-stated)          Lose 25 lbs.      Fall Risk Fall Risk  08/05/2016 02/07/2016 12/14/2014 03/16/2013  Falls in the past year? No No No No   Depression Screen PHQ 2/9 Scores 08/05/2016 12/14/2014 03/16/2013  PHQ - 2 Score 0 0 0    Cognitive Testing MMSE - Mini Mental State Exam 08/05/2016  Orientation  to time 5  Orientation to Place 5  Registration 3  Attention/ Calculation 4  Recall 2  Language- name 2 objects 2  Language- repeat 1  Language- follow 3 step command 3  Language- read & follow direction 1  Write a sentence 0  Copy design 0  Total score 26    Immunization History  Administered Date(s) Administered  . Influenza Split 10/15/2011  . Influenza Whole 10/02/2010  . Influenza, High Dose Seasonal PF 09/20/2014, 08/31/2015  . Influenza,inj,Quad PF,36+ Mos 12/07/2013  . Pneumococcal Conjugate-13 09/20/2014  . Pneumococcal  Polysaccharide-23 04/23/2005, 10/17/2015  . Td 01/15/2000, 10/02/2010  . Zoster 07/22/2006   Screening Tests Health Maintenance  Topic Date Due  . COLONOSCOPY  01/04/2016  . INFLUENZA VACCINE  07/08/2016  . TETANUS/TDAP  10/02/2020  . ZOSTAVAX  Completed  . PNA vac Low Risk Adult  Completed      Plan:    Continue to eat heart healthy diet (full of fruits, vegetables, whole grains, lean protein, water--limit salt, fat, and sugar intake) and increase physical activity as tolerated.  Continue doing brain stimulating activities (puzzles, reading, adult coloring books, staying active) to keep memory sharp.   Follow-up with Dr. Carollee Herter as scheduled.  Bring a copy of your advanced directives to your next office visit.  Call Dr. Lynne Leader office at [phone]  to schedule your colonoscopy prior to the end of the year.  During the course of the visit the patient was educated and counseled about the following appropriate screening and preventive services:   Vaccines to include Pneumoccal, Influenza, Hepatitis B, Td, Zostavax, HCV  Cardiovascular Disease  Colorectal cancer screening  Diabetes screening  Prostate Cancer Screening  Glaucoma screening  Nutrition counseling   Patient Instructions (the written plan) was given to the patient.    Dorrene German, RN  08/05/2016

## 2016-08-05 NOTE — Patient Instructions (Signed)
Continue to eat heart healthy diet (full of fruits, vegetables, whole grains, lean protein, water--limit salt, fat, and sugar intake) and increase physical activity as tolerated.  Continue doing brain stimulating activities (puzzles, reading, adult coloring books, staying active) to keep memory sharp.   Follow-up with Dr. Carollee Herter as scheduled.  Bring a copy of your advanced directives to your next office visit.  Call Dr. Lynne Leader office at [phone]  to schedule your colonoscopy prior to the end of the year.

## 2016-08-06 ENCOUNTER — Encounter: Payer: Self-pay | Admitting: Gastroenterology

## 2016-09-16 ENCOUNTER — Other Ambulatory Visit: Payer: Self-pay | Admitting: Family Medicine

## 2016-09-25 ENCOUNTER — Ambulatory Visit: Payer: Medicare Other | Admitting: Family Medicine

## 2016-10-03 ENCOUNTER — Ambulatory Visit (INDEPENDENT_AMBULATORY_CARE_PROVIDER_SITE_OTHER): Payer: Medicare Other | Admitting: Family Medicine

## 2016-10-03 ENCOUNTER — Encounter: Payer: Self-pay | Admitting: Family Medicine

## 2016-10-03 VITALS — BP 142/80 | HR 61 | Resp 16 | Ht 65.0 in | Wt 180.6 lb

## 2016-10-03 DIAGNOSIS — Z23 Encounter for immunization: Secondary | ICD-10-CM | POA: Diagnosis not present

## 2016-10-03 DIAGNOSIS — R739 Hyperglycemia, unspecified: Secondary | ICD-10-CM

## 2016-10-03 DIAGNOSIS — I10 Essential (primary) hypertension: Secondary | ICD-10-CM

## 2016-10-03 DIAGNOSIS — E785 Hyperlipidemia, unspecified: Secondary | ICD-10-CM | POA: Diagnosis not present

## 2016-10-03 LAB — COMPREHENSIVE METABOLIC PANEL
ALBUMIN: 4.6 g/dL (ref 3.5–5.2)
ALT: 101 U/L — ABNORMAL HIGH (ref 0–53)
AST: 97 U/L — ABNORMAL HIGH (ref 0–37)
Alkaline Phosphatase: 90 U/L (ref 39–117)
BUN: 17 mg/dL (ref 6–23)
CALCIUM: 10.4 mg/dL (ref 8.4–10.5)
CHLORIDE: 102 meq/L (ref 96–112)
CO2: 30 meq/L (ref 19–32)
CREATININE: 0.99 mg/dL (ref 0.40–1.50)
GFR: 94.2 mL/min (ref >60.00)
Glucose, Bld: 93 mg/dL (ref 70–99)
POTASSIUM: 4.6 meq/L (ref 3.5–5.1)
Sodium: 139 mEq/L (ref 135–145)
Total Bilirubin: 0.5 mg/dL (ref 0.2–1.2)
Total Protein: 7.7 g/dL (ref 6.0–8.3)

## 2016-10-03 LAB — LIPID PANEL
CHOL/HDL RATIO: 2
CHOLESTEROL: 149 mg/dL (ref 0–200)
HDL: 61.8 mg/dL (ref >39.00)
LDL CALC: 76 mg/dL (ref 0–99)
NonHDL: 87.18
TRIGLYCERIDES: 54 mg/dL (ref 0.0–149.0)
VLDL: 10.8 mg/dL (ref 0.0–40.0)

## 2016-10-03 LAB — HEMOGLOBIN A1C: Hgb A1c MFr Bld: 6 % (ref 4.6–6.5)

## 2016-10-03 MED ORDER — METOPROLOL SUCCINATE ER 200 MG PO TB24: 200.0000 mg | ORAL_TABLET | Freq: Every day | ORAL | 1 refills | Status: DC

## 2016-10-03 MED ORDER — ATORVASTATIN CALCIUM 40 MG PO TABS: 40.0000 mg | ORAL_TABLET | Freq: Every day | ORAL | 5 refills | Status: DC

## 2016-10-03 MED ORDER — AMLODIPINE BESYLATE 10 MG PO TABS: 10.0000 mg | ORAL_TABLET | Freq: Every day | ORAL | 1 refills | Status: DC

## 2016-10-03 MED ORDER — HYDROCHLOROTHIAZIDE 12.5 MG PO CAPS: 12.5000 mg | ORAL_CAPSULE | Freq: Every day | ORAL | 1 refills | Status: DC

## 2016-10-03 NOTE — Patient Instructions (Signed)

## 2016-10-03 NOTE — Progress Notes (Signed)
Patient ID: [name] Gibson, male    DOB: 1941-03-03  Age: 75 y.o. MRN: [id number]    Subjective:  Subjective  HPI [name] Gibson presents for f/u bp and cholesterol and would like his flu shot  Review of Systems  Constitutional: Negative for appetite change, diaphoresis, fatigue and unexpected weight change.  Eyes: Negative for pain, redness and visual disturbance.  Respiratory: Negative for cough, chest tightness, shortness of breath and wheezing.   Cardiovascular: Negative for chest pain, palpitations and leg swelling.  Endocrine: Negative for cold intolerance, heat intolerance, polydipsia, polyphagia and polyuria.  Genitourinary: Negative for difficulty urinating, dysuria and frequency.  Neurological: Negative for dizziness, light-headedness, numbness and headaches.    History Past Medical History:  Diagnosis Date  . ANGIOEDEMA 08/25/2007   Qualifier: Diagnosis of  By: Jerold Coombe    . ANKLE PAIN, RIGHT 11/16/2008   Qualifier: Diagnosis of  By: Jerold Coombe    . Cancer [facility])    prostate  . COLONIC POLYPS 10/22/2007   Qualifier: Diagnosis of  By: Jerold Coombe    . DIVERTICULOSIS OF COLON 10/22/2007   Qualifier: Diagnosis of  By: Jerold Coombe    . GLAUCOMA 03/23/2007   Qualifier: Diagnosis of  By: Jerold Coombe    . GLUCOSE INTOLERANCE, HX OF 03/23/2007   Qualifier: Diagnosis of  By: Jerold Coombe    . Hyperlipidemia   . HYPERLIPIDEMIA 03/23/2007   Qualifier: Diagnosis of  By: Jerold Coombe    . Hypertension   . HYPERTENSION 09/08/2007   Qualifier: Diagnosis of  By: Jerold Coombe    . HYPERTHYROIDISM 03/23/2007   Qualifier: Diagnosis of  By: Jerold Coombe    . Obesity (BMI 30-39.9) 12/07/2013  . PROSTATE CANCER, HX OF 10/02/2010   Qualifier: Diagnosis of  By: Jerold Coombe    . PSA, INCREASED 08/08/2009   Qualifier: Diagnosis of  By: Jerold Coombe    . Right shoulder pain 02/08/2015  . Sebaceous cyst 12/13/2008   Qualifier: Diagnosis of  By: Jerold Coombe    . SINUSITIS- ACUTE-NOS 09/12/2008   Qualifier: Diagnosis of  By: Jerold Coombe    . URI 12/13/2008   Qualifier: Diagnosis of  By: Jerold Coombe      He has a past surgical history that includes Appendectomy and radioactive seed implation.   His family history includes Brain cancer in his brother; Cancer in his father; Coronary artery disease in his other; Diabetes in his other; Diabetes Mellitus II in his brother; Hyperlipidemia in his other; Hypertension in his other; Kidney failure in his brother; Lung cancer in his brother; Stomach cancer in his sister; Stroke in his brother, brother, and sister; Throat cancer in his brother.He reports that he has never smoked. He has never used smokeless tobacco. He reports that he drinks about 0.6 oz of alcohol per week . He reports that he does not use drugs.  Current Outpatient Prescriptions on File Prior to Visit  Medication Sig Dispense Refill  . chlorhexidine (PERIDEX) 0.12 % solution daily.  1  . LUMIGAN 0.01 % SOLN Place 1 drop into both eyes at bedtime.   4  . nystatin (MYCOSTATIN) 100000 UNIT/ML suspension 5 ml swish and spit qid (Patient not taking: Reported on 10/03/2016) 150 mL 0   No current facility-administered medications on file prior to visit.      Objective:  Objective  Physical Exam  Constitutional: He is oriented to person,  place, and time. Vital signs are normal. He appears well-developed and well-nourished. He is sleeping.  HENT:  Head: Normocephalic and atraumatic.  Mouth/Throat: Oropharynx is clear and moist.  Eyes: EOM are normal. Pupils are equal, round, and reactive to light.  Neck: Normal range of motion. Neck supple. No thyromegaly present.  Cardiovascular: Normal rate and regular rhythm.   No murmur heard. Pulmonary/Chest: Effort normal and breath sounds normal. No respiratory distress. He has no wheezes. He has no rales. He exhibits no tenderness.  Musculoskeletal: He exhibits no edema or  tenderness.  Neurological: He is alert and oriented to person, place, and time.  Skin: Skin is warm and dry.  Psychiatric: He has a normal mood and affect. His behavior is normal. Judgment and thought content normal.  Nursing note and vitals reviewed.  BP (!) 142/80 (BP Location: Left Arm, Patient Position: Sitting, Cuff Size: Normal)   Pulse 61   Resp 16   Ht 5\' 5"  (1.651 m)   Wt 180 lb 9.6 oz (81.9 kg)   SpO2 99%   BMI 30.05 kg/m  Wt Readings from Last 3 Encounters:  10/03/16 180 lb 9.6 oz (81.9 kg)  08/05/16 183 lb 4.8 oz (83.1 kg)  03/27/16 180 lb (81.6 kg)     Lab Results  Component Value Date   WBC 4.5 03/27/2016   HGB 14.7 03/27/2016   HCT 43.7 03/27/2016   PLT 192.0 03/27/2016   GLUCOSE 93 10/03/2016   CHOL 149 10/03/2016   TRIG 54.0 10/03/2016   HDL 61.80 10/03/2016   LDLDIRECT 173.4 03/16/2013   LDLCALC 76 10/03/2016   ALT 101 (H) 10/03/2016   AST 97 (H) 10/03/2016   NA 139 10/03/2016   K 4.6 10/03/2016   CL 102 10/03/2016   CREATININE 0.99 10/03/2016   BUN 17 10/03/2016   CO2 30 10/03/2016   TSH 2.28 12/14/2014   PSA 0.16 12/14/2014   INR 1.03 12/12/2009   HGBA1C 6.0 10/03/2016   MICROALBUR 0.6 12/14/2014    Mr Brain Wo Contrast  Result Date: 02/14/2016 CLINICAL DATA:  Patient woke up with dizziness. Similar episode December 2016. EXAM: MRI HEAD WITHOUT CONTRAST TECHNIQUE: Multiplanar, multiecho pulse sequences of the brain and surrounding structures were obtained without intravenous contrast. COMPARISON:  None. FINDINGS: No evidence for acute infarction, hemorrhage, mass lesion, hydrocephalus, or extra-axial fluid. Global atrophy. Mild to moderate subcortical and periventricular T2 and FLAIR hyperintensities, likely chronic microvascular ischemic change. Pituitary, pineal, and cerebellar tonsils unremarkable. No upper cervical lesions. Flow voids are maintained throughout the carotid, basilar, and vertebral arteries. There are no areas of chronic  hemorrhage. Visualized calvarium, skull base, and upper cervical osseous structures unremarkable. Scalp and extracranial soft tissues, orbits, sinuses, and mastoids show no acute process. IMPRESSION: Atrophy and small vessel disease.  No acute intracranial findings. Electronically Signed   By: Staci Righter M.D.   On: 02/14/2016 13:43     Assessment & Plan:  Plan  I have changed Mr. Christinia Gully metoprolol, hydrochlorothiazide, atorvastatin, and amLODipine. I am also having him maintain his LUMIGAN, nystatin, and chlorhexidine.  Meds ordered this encounter  Medications  . metoprolol (TOPROL-XL) 200 MG 24 hr tablet    Sig: Take 1 tablet (200 mg total) by mouth daily.    Dispense:  90 tablet    Refill:  1  . hydrochlorothiazide (MICROZIDE) 12.5 MG capsule    Sig: Take 1 capsule (12.5 mg total) by mouth daily.    Dispense:  90 capsule    Refill:  1  . atorvastatin (LIPITOR) 40 MG tablet    Sig: Take 1 tablet (40 mg total) by mouth at bedtime.    Dispense:  30 tablet    Refill:  5  . amLODipine (NORVASC) 10 MG tablet    Sig: Take 1 tablet (10 mg total) by mouth daily.    Dispense:  90 tablet    Refill:  1    Problem List Items Addressed This Visit      Unprioritized   Hyperlipidemia   Relevant Medications   metoprolol (TOPROL-XL) 200 MG 24 hr tablet   hydrochlorothiazide (MICROZIDE) 12.5 MG capsule   atorvastatin (LIPITOR) 40 MG tablet   amLODipine (NORVASC) 10 MG tablet   Other Relevant Orders   Comprehensive metabolic panel (Completed)   Lipid panel (Completed)   Essential hypertension - Primary    con't meds stable      Relevant Medications   metoprolol (TOPROL-XL) 200 MG 24 hr tablet   hydrochlorothiazide (MICROZIDE) 12.5 MG capsule   atorvastatin (LIPITOR) 40 MG tablet   amLODipine (NORVASC) 10 MG tablet   Other Relevant Orders   Comprehensive metabolic panel (Completed)    Other Visit Diagnoses    Hyperglycemia       Relevant Orders   Hemoglobin A1c (Completed)    Encounter for immunization       Relevant Orders   Flu vaccine HIGH DOSE PF (Completed)      Follow-up: Return in about 6 months (around 04/03/2017) for hypertension, hyperlipidemia, fasting, annual exam.  [name], DO

## 2016-10-03 NOTE — Progress Notes (Signed)
Pre visit review using our clinic review tool, if applicable. No additional management support is needed unless otherwise documented below in the visit note. 

## 2016-10-04 NOTE — Assessment & Plan Note (Signed)
con't meds stable 

## 2016-10-08 ENCOUNTER — Encounter: Payer: Self-pay | Admitting: Gastroenterology

## 2016-10-08 ENCOUNTER — Ambulatory Visit: Payer: Medicare Other | Admitting: *Deleted

## 2016-10-08 VITALS — Ht 64.0 in | Wt 178.8 lb

## 2016-10-08 DIAGNOSIS — Z86010 Personal history of colonic polyps: Secondary | ICD-10-CM

## 2016-10-08 MED ORDER — SUPREP BOWEL PREP KIT 17.5-3.13-1.6 GM/177ML PO SOLN: 1.0000 | Freq: Once | ORAL | 0 refills | Status: AC

## 2016-10-08 NOTE — Progress Notes (Signed)
Patient denies any allergies to egg or soy products. Patient denies complications with anesthesia/sedation.  Patient denies oxygen use at home and denies diet medications. Emmi instructions for colonoscopy explained but patient denied.     

## 2016-10-22 ENCOUNTER — Encounter: Payer: Self-pay | Admitting: Gastroenterology

## 2016-10-22 ENCOUNTER — Ambulatory Visit (AMBULATORY_SURGERY_CENTER): Payer: Medicare Other | Admitting: Gastroenterology

## 2016-10-22 VITALS — BP 142/84 | HR 54 | Temp 96.9°F | Resp 16 | Ht 64.0 in | Wt 178.0 lb

## 2016-10-22 DIAGNOSIS — D123 Benign neoplasm of transverse colon: Secondary | ICD-10-CM

## 2016-10-22 DIAGNOSIS — D124 Benign neoplasm of descending colon: Secondary | ICD-10-CM | POA: Diagnosis not present

## 2016-10-22 DIAGNOSIS — Z86010 Personal history of colonic polyps: Secondary | ICD-10-CM | POA: Diagnosis not present

## 2016-10-22 MED ORDER — SODIUM CHLORIDE 0.9 % IV SOLN: 500.0000 mL | INTRAVENOUS | Status: DC

## 2016-10-22 NOTE — Progress Notes (Signed)
To recovery, report to Scott, RN, VSS 

## 2016-10-22 NOTE — Op Note (Addendum)
[facility] Patient Name: [name] Gibson Procedure Date: 10/22/2016 9:01 AM MRN: [id number] Endoscopist: Ladene Artist , MD Age: 75 Referring MD:  Date of Birth: [birth year] Gender: Male Account #: [id number] Procedure:                Colonoscopy Indications:              Surveillance: Personal history of adenomatous                            polyps on last colonoscopy > 5 years ago Medicines:                Monitored Anesthesia Care Procedure:                Pre-Anesthesia Assessment:                           - Prior to the procedure, a History and Physical                            was performed, and patient medications and                            allergies were reviewed. The patient's tolerance of                            previous anesthesia was also reviewed. The risks                            and benefits of the procedure and the sedation                            options and risks were discussed with the patient.                            All questions were answered, and informed consent                            was obtained. Prior Anticoagulants: The patient has                            taken no previous anticoagulant or antiplatelet                            agents. ASA Grade Assessment: II - A patient with                            mild systemic disease. After reviewing the risks                            and benefits, the patient was deemed in                            satisfactory condition to undergo the procedure.  After obtaining informed consent, the colonoscope                            was passed under direct vision. Throughout the                            procedure, the patient's blood pressure, pulse, and                            oxygen saturations were monitored continuously. The                            Model PCF-H190L [id number]) scope was introduced                            through the anus and  advanced to the the cecum,                            identified by appendiceal orifice and ileocecal                            valve. The ileocecal valve, appendiceal orifice,                            and rectum were photographed. The quality of the                            bowel preparation was good. The colonoscopy was                            performed without difficulty. The patient tolerated                            the procedure well. Scope In: 9:17:07 AM Scope Out: 9:28:10 AM Scope Withdrawal Time: 0 hours 9 minutes 41 seconds  Total Procedure Duration: 0 hours 11 minutes 3 seconds  Findings:                 The perianal and digital rectal examinations were                            normal.                           A 8 mm polyp was found in the transverse colon. The                            polyp was sessile. The polyp was removed with a                            cold snare. Resection and retrieval were complete.                           A 5 mm polyp was found in the descending colon. The  polyp was sessile. The polyp was removed with a                            cold biopsy forceps. Resection and retrieval were                            complete.                           Many medium-mouthed diverticula were found in the                            sigmoid colon, descending colon, transverse colon                            and ascending colon. There was no evidence of                            diverticular bleeding.                           The exam was otherwise without abnormality on                            direct and retroflexion views. Complications:            No immediate complications. Estimated blood loss:                            None. Estimated Blood Loss:     Estimated blood loss: none. Impression:               - One 8 mm polyp in the transverse colon, removed                            with a cold snare. Resected and  retrieved.                           - One 5 mm polyp in the descending colon, removed                            with a cold biopsy forceps. Resected and retrieved.                           - Moderate diverticulosis in the sigmoid colon, in                            the descending colon, in the transverse colon and                            in the ascending colon.                           - The examination was otherwise normal on direct  and retroflexion views. Recommendation:           - Patient has a contact number available for                            emergencies. The signs and symptoms of potential                            delayed complications were discussed with the                            patient. Return to normal activities tomorrow.                            Written discharge instructions were provided to the                            patient.                           - High fiber diet.                           - Continue present medications.                           - Await pathology results.                           - No repeat colonoscopy due to age. Ladene Artist, MD 10/22/2016 9:33:00 AM This report has been signed electronically.

## 2016-10-22 NOTE — Patient Instructions (Signed)
YOU HAD AN ENDOSCOPIC PROCEDURE TODAY AT [facility]:   Refer to the procedure report that was given to you for any specific questions about what was found during the examination.  If the procedure report does not answer your questions, please call your gastroenterologist to clarify.  If you requested that your care partner not be given the details of your procedure findings, then the procedure report has been included in a sealed envelope for you to review at your convenience later.  YOU SHOULD EXPECT: Some feelings of bloating in the abdomen. Passage of more gas than usual.  Walking can help get rid of the air that was put into your GI tract during the procedure and reduce the bloating. If you had a lower endoscopy (such as a colonoscopy or flexible sigmoidoscopy) you may notice spotting of blood in your stool or on the toilet paper. If you underwent a bowel prep for your procedure, you may not have a normal bowel movement for a few days.  Please Note:  You might notice some irritation and congestion in your nose or some drainage.  This is from the oxygen used during your procedure.  There is no need for concern and it should clear up in a day or so.  SYMPTOMS TO REPORT IMMEDIATELY:   Following lower endoscopy (colonoscopy or flexible sigmoidoscopy):  Excessive amounts of blood in the stool  Significant tenderness or worsening of abdominal pains  Swelling of the abdomen that is new, acute  Fever of 100F or higher   Following upper endoscopy (EGD)  Vomiting of blood or coffee ground material  New chest pain or pain under the shoulder blades  Painful or persistently difficult swallowing  New shortness of breath  Fever of 100F or higher  Black, tarry-looking stools  For urgent or emergent issues, a gastroenterologist can be reached at any hour by calling [phone].   DIET:  We do recommend a small meal at first, but then you may proceed to your regular diet.  Drink  plenty of fluids but you should avoid alcoholic beverages for 24 hours.  ACTIVITY:  You should plan to take it easy for the rest of today and you should NOT DRIVE or use heavy machinery until tomorrow (because of the sedation medicines used during the test).    FOLLOW UP: Our staff will call the number listed on your records the next business day following your procedure to check on you and address any questions or concerns that you may have regarding the information given to you following your procedure. If we do not reach you, we will leave a message.  However, if you are feeling well and you are not experiencing any problems, there is no need to return our call.  We will assume that you have returned to your regular daily activities without incident.  If any biopsies were taken you will be contacted by phone or by letter within the next 1-3 weeks.  Please call us at [phone] if you have not heard about the biopsies in 3 weeks.    SIGNATURES/CONFIDENTIALITY: You and/or your care partner have signed paperwork which will be entered into your electronic medical record.  These signatures attest to the fact that that the information above on your After Visit Summary has been reviewed and is understood.  Full responsibility of the confidentiality of this discharge information lies with you and/or your care-partner.  Polyp, diverticulosis and high fiber information given.  No recall endoscopy  is indicated.

## 2016-10-22 NOTE — Progress Notes (Signed)
Called to room to assist during endoscopic procedure.  Patient ID and intended procedure confirmed with present staff. Received instructions for my participation in the procedure from the performing physician.  

## 2016-10-23 ENCOUNTER — Telehealth: Payer: Self-pay | Admitting: Behavioral Health

## 2016-10-23 ENCOUNTER — Telehealth: Payer: Self-pay

## 2016-10-23 ENCOUNTER — Other Ambulatory Visit (INDEPENDENT_AMBULATORY_CARE_PROVIDER_SITE_OTHER): Payer: Medicare Other

## 2016-10-23 DIAGNOSIS — R748 Abnormal levels of other serum enzymes: Secondary | ICD-10-CM

## 2016-10-23 LAB — HEPATIC FUNCTION PANEL
ALBUMIN: 4.5 g/dL (ref 3.5–5.2)
ALT: 41 U/L (ref 0–53)
AST: 28 U/L (ref 0–37)
Alkaline Phosphatase: 46 U/L (ref 39–117)
Bilirubin, Direct: 0.1 mg/dL (ref 0.0–0.3)
Total Bilirubin: 0.5 mg/dL (ref 0.2–1.2)
Total Protein: 7.7 g/dL (ref 6.0–8.3)

## 2016-10-23 NOTE — Telephone Encounter (Signed)
Patient currently at [facility] office for labs. Placed order for hepatic panel per the provider's recommendations below.  Notes Recorded by [name], DO on 10/05/2016 at 5:03 PM EDT Liver enz high--- any otc, tylenol or etoh --- more than usual? If not hold lipitor for 2 weeks and recheck the hep panal--- we can order it for elam if that is more convenient

## 2016-10-23 NOTE — Telephone Encounter (Signed)
Left a message at [phone] with pt's ID on recording.  Pt to call us back if any questions or concerns. maw

## 2016-10-24 ENCOUNTER — Other Ambulatory Visit: Payer: Medicare Other

## 2016-10-24 ENCOUNTER — Telehealth: Payer: Self-pay

## 2016-10-24 NOTE — Telephone Encounter (Signed)
  Follow up Call-  Call back number 10/22/2016  Post procedure Call Back phone  # [phone]  Permission to leave phone message Yes  Some recent data might be hidden    Patient was called for follow up after his procedure on 10/22/2016. No answer at the number given for follow up phone call. A message was left on the answering machine.

## 2016-11-03 ENCOUNTER — Encounter: Payer: Self-pay | Admitting: Gastroenterology

## 2016-11-26 ENCOUNTER — Other Ambulatory Visit: Payer: Self-pay | Admitting: Family Medicine

## 2016-12-11 DIAGNOSIS — H2513 Age-related nuclear cataract, bilateral: Secondary | ICD-10-CM | POA: Diagnosis not present

## 2016-12-11 DIAGNOSIS — H401133 Primary open-angle glaucoma, bilateral, severe stage: Secondary | ICD-10-CM | POA: Diagnosis not present

## 2016-12-11 DIAGNOSIS — H04123 Dry eye syndrome of bilateral lacrimal glands: Secondary | ICD-10-CM | POA: Diagnosis not present

## 2017-02-04 DIAGNOSIS — L910 Hypertrophic scar: Secondary | ICD-10-CM | POA: Diagnosis not present

## 2017-02-04 DIAGNOSIS — D2221 Melanocytic nevi of right ear and external auricular canal: Secondary | ICD-10-CM | POA: Diagnosis not present

## 2017-02-17 ENCOUNTER — Other Ambulatory Visit: Payer: Self-pay | Admitting: Family Medicine

## 2017-02-25 DIAGNOSIS — C61 Malignant neoplasm of prostate: Secondary | ICD-10-CM | POA: Diagnosis not present

## 2017-03-04 DIAGNOSIS — R972 Elevated prostate specific antigen [PSA]: Secondary | ICD-10-CM | POA: Diagnosis not present

## 2017-03-04 DIAGNOSIS — C61 Malignant neoplasm of prostate: Secondary | ICD-10-CM | POA: Diagnosis not present

## 2017-03-16 ENCOUNTER — Other Ambulatory Visit: Payer: Self-pay | Admitting: Family Medicine

## 2017-05-12 DIAGNOSIS — H401133 Primary open-angle glaucoma, bilateral, severe stage: Secondary | ICD-10-CM | POA: Diagnosis not present

## 2017-05-18 ENCOUNTER — Other Ambulatory Visit: Payer: Self-pay | Admitting: Family Medicine

## 2017-05-25 ENCOUNTER — Other Ambulatory Visit: Payer: Self-pay | Admitting: Family Medicine

## 2017-06-13 ENCOUNTER — Other Ambulatory Visit: Payer: Self-pay | Admitting: Family Medicine

## 2017-08-16 ENCOUNTER — Other Ambulatory Visit: Payer: Self-pay | Admitting: Family Medicine

## 2017-08-25 ENCOUNTER — Other Ambulatory Visit: Payer: Self-pay | Admitting: Family Medicine

## 2017-08-25 ENCOUNTER — Encounter: Payer: Self-pay | Admitting: Family Medicine

## 2017-08-25 ENCOUNTER — Ambulatory Visit (INDEPENDENT_AMBULATORY_CARE_PROVIDER_SITE_OTHER): Payer: Medicare Other | Admitting: Family Medicine

## 2017-08-25 VITALS — BP 132/74 | HR 64 | Temp 97.9°F | Ht 64.0 in | Wt 175.8 lb

## 2017-08-25 DIAGNOSIS — E0590 Thyrotoxicosis, unspecified without thyrotoxic crisis or storm: Secondary | ICD-10-CM | POA: Diagnosis not present

## 2017-08-25 DIAGNOSIS — Z0000 Encounter for general adult medical examination without abnormal findings: Secondary | ICD-10-CM

## 2017-08-25 DIAGNOSIS — Z23 Encounter for immunization: Secondary | ICD-10-CM

## 2017-08-25 DIAGNOSIS — I10 Essential (primary) hypertension: Secondary | ICD-10-CM

## 2017-08-25 DIAGNOSIS — Z8546 Personal history of malignant neoplasm of prostate: Secondary | ICD-10-CM

## 2017-08-25 DIAGNOSIS — E785 Hyperlipidemia, unspecified: Secondary | ICD-10-CM | POA: Diagnosis not present

## 2017-08-25 LAB — POC URINALSYSI DIPSTICK (AUTOMATED)
BILIRUBIN UA: NEGATIVE
GLUCOSE UA: NEGATIVE
Ketones, UA: NEGATIVE
Leukocytes, UA: NEGATIVE
Nitrite, UA: NEGATIVE
Protein, UA: NEGATIVE
RBC UA: NEGATIVE
Spec Grav, UA: 1.015 (ref 1.010–1.025)
UROBILINOGEN UA: 0.2 U/dL
pH, UA: 7 (ref 5.0–8.0)

## 2017-08-25 NOTE — Patient Instructions (Signed)
Preventive Care 78 Years and Older, Male Preventive care refers to lifestyle choices and visits with your health care provider that can promote health and wellness. What does preventive care include?  A yearly physical exam. This is also called an annual well check.  Dental exams once or twice a year.  Routine eye exams. Ask your health care provider how often you should have your eyes checked.  Personal lifestyle choices, including: ? Daily care of your teeth and gums. ? Regular physical activity. ? Eating a healthy diet. ? Avoiding tobacco and drug use. ? Limiting alcohol use. ? Practicing safe sex. ? Taking low doses of aspirin every day. ? Taking vitamin and mineral supplements as recommended by your health care provider. What happens during an annual well check? The services and screenings done by your health care provider during your annual well check will depend on your age, overall health, lifestyle risk factors, and family history of disease. Counseling Your health care provider may ask you questions about your:  Alcohol use.  Tobacco use.  Drug use.  Emotional well-being.  Home and relationship well-being.  Sexual activity.  Eating habits.  History of falls.  Memory and ability to understand (cognition).  Work and work environment.  Screening You may have the following tests or measurements:  Height, weight, and BMI.  Blood pressure.  Lipid and cholesterol levels. These may be checked every 5 years, or more frequently if you are over 50 years old.  Skin check.  Lung cancer screening. You may have this screening every year starting at age 55 if you have a 30-pack-year history of smoking and currently smoke or have quit within the past 15 years.  Fecal occult blood test (FOBT) of the stool. You may have this test every year starting at age 50.  Flexible sigmoidoscopy or colonoscopy. You may have a sigmoidoscopy every 5 years or a colonoscopy every 10  years starting at age 50.  Prostate cancer screening. Recommendations will vary depending on your family history and other risks.  Hepatitis C blood test.  Hepatitis B blood test.  Sexually transmitted disease (STD) testing.  Diabetes screening. This is done by checking your blood sugar (glucose) after you have not eaten for a while (fasting). You may have this done every 1-3 years.  Abdominal aortic aneurysm (AAA) screening. You may need this if you are a current or former smoker.  Osteoporosis. You may be screened starting at age 70 if you are at high risk.  Talk with your health care provider about your test results, treatment options, and if necessary, the need for more tests. Vaccines Your health care provider may recommend certain vaccines, such as:  Influenza vaccine. This is recommended every year.  Tetanus, diphtheria, and acellular pertussis (Tdap, Td) vaccine. You may need a Td booster every 10 years.  Varicella vaccine. You may need this if you have not been vaccinated.  Zoster vaccine. You may need this after age 60.  Measles, mumps, and rubella (MMR) vaccine. You may need at least one dose of MMR if you were born in 1957 or later. You may also need a second dose.  Pneumococcal 13-valent conjugate (PCV13) vaccine. One dose is recommended after age 65.  Pneumococcal polysaccharide (PPSV23) vaccine. One dose is recommended after age 65.  Meningococcal vaccine. You may need this if you have certain conditions.  Hepatitis A vaccine. You may need this if you have certain conditions or if you travel or work in places where you   may be exposed to hepatitis A.  Hepatitis B vaccine. You may need this if you have certain conditions or if you travel or work in places where you may be exposed to hepatitis B.  Haemophilus influenzae type b (Hib) vaccine. You may need this if you have certain risk factors.  Talk to your health care provider about which screenings and vaccines  you need and how often you need them. This information is not intended to replace advice given to you by your health care provider. Make sure you discuss any questions you have with your health care provider. Document Released: 12/21/2015 Document Revised: 08/13/2016 Document Reviewed: 09/25/2015 Elsevier Interactive Patient Education  2017 [employer].

## 2017-08-25 NOTE — Assessment & Plan Note (Signed)
Tolerating statin, encouraged heart healthy diet, avoid trans fats, minimize simple carbs and saturated fats. Increase exercise as tolerated 

## 2017-08-25 NOTE — Assessment & Plan Note (Signed)
Well controlled, no changes to meds. Encouraged heart healthy diet such as the DASH diet and exercise as tolerated.  °

## 2017-08-25 NOTE — Assessment & Plan Note (Signed)
Per u rology 

## 2017-08-25 NOTE — Progress Notes (Signed)
Patient ID: [name] Gibson, male    DOB: December 02, 1941  Age: 76 y.o. MRN: [id number]    Subjective:  Subjective  HPI [name] Gibson presents for cpe and labs  No complaints.   Review of Systems  Constitutional: Negative.   HENT: Negative for congestion, ear pain, hearing loss, nosebleeds, postnasal drip, rhinorrhea, sinus pressure, sneezing and tinnitus.   Eyes: Negative for photophobia, discharge, itching and visual disturbance.  Respiratory: Negative.   Cardiovascular: Negative.   Gastrointestinal: Negative for abdominal distention, abdominal pain, anal bleeding, blood in stool and constipation.  Endocrine: Negative.   Genitourinary: Negative.   Musculoskeletal: Negative.   Skin: Negative.   Allergic/Immunologic: Negative.   Neurological: Negative for dizziness, weakness, light-headedness, numbness and headaches.  Psychiatric/Behavioral: Negative for agitation, confusion, decreased concentration, dysphoric mood, sleep disturbance and suicidal ideas. The patient is not nervous/anxious.     History Past Medical History:  Diagnosis Date  . ANGIOEDEMA 08/25/2007   Qualifier: Diagnosis of  By: Jerold Coombe    . ANKLE PAIN, RIGHT 11/16/2008   Qualifier: Diagnosis of  By: Jerold Coombe    . Cancer [facility])    prostate  . COLONIC POLYPS 10/22/2007   Qualifier: Diagnosis of  By: Jerold Coombe    . DIVERTICULOSIS OF COLON 10/22/2007   Qualifier: Diagnosis of  By: Jerold Coombe    . GLAUCOMA 03/23/2007   Qualifier: Diagnosis of  By: Jerold Coombe    . Glaucoma   . GLUCOSE INTOLERANCE, HX OF 03/23/2007   Qualifier: Diagnosis of  By: Jerold Coombe    . Hyperlipidemia   . HYPERLIPIDEMIA 03/23/2007   Qualifier: Diagnosis of  By: Jerold Coombe    . Hypertension   . HYPERTENSION 09/08/2007   Qualifier: Diagnosis of  By: Jerold Coombe    . HYPERTHYROIDISM 03/23/2007   Qualifier: Diagnosis of  By: Jerold Coombe  - no med  . Obesity (BMI 30-39.9) 12/07/2013  . PROSTATE  CANCER, HX OF 10/02/2010   Qualifier: Diagnosis of  By: Jerold Coombe    . PSA, INCREASED 08/08/2009   Qualifier: Diagnosis of  By: Jerold Coombe    . Right shoulder pain 02/08/2015  . Sebaceous cyst 12/13/2008   Qualifier: Diagnosis of  By: Jerold Coombe    . SINUSITIS- ACUTE-NOS 09/12/2008   Qualifier: Diagnosis of  By: Jerold Coombe    . URI 12/13/2008   Qualifier: Diagnosis of  By: Jerold Coombe      He has a past surgical history that includes Appendectomy and radioactive seed implation.   His family history includes Brain cancer in his brother; Breast cancer in his sister; Cancer in his father; Colon cancer in his maternal grandmother; Coronary artery disease in his other; Diabetes in his other; Diabetes Mellitus II in his brother; Hyperlipidemia in his other; Hypertension in his other; Kidney failure in his brother; Lung cancer in his brother; Stomach cancer in his sister; Stroke in his brother, brother, and sister; Throat cancer in his brother.He reports that he has never smoked. He has never used smokeless tobacco. He reports that he drinks about 0.6 oz of alcohol per week . He reports that he does not use drugs.  Current Outpatient Prescriptions on File Prior to Visit  Medication Sig Dispense Refill  . amLODipine (NORVASC) 10 MG tablet Take 1 tablet (10 mg total) by mouth daily. 90 tablet 1  . hydrochlorothiazide (MICROZIDE) 12.5 MG capsule TAKE 1 CAPSULE BY MOUTH  DAILY 90 capsule 0  . LUMIGAN 0.01 % SOLN Place 1 drop into both eyes at bedtime.   4  . metoprolol (TOPROL-XL) 200 MG 24 hr tablet TAKE 1 TABLET BY MOUTH DAILY 90 tablet 0  . Multiple Vitamin (MULTIVITAMIN) tablet Take 1 tablet by mouth daily.    Marland Kitchen atorvastatin (LIPITOR) 40 MG tablet Take 1 tablet (40 mg total) by mouth at bedtime. (Patient not taking: Reported on 10/22/2016) 30 tablet 5   Current Facility-Administered Medications on File Prior to Visit  Medication Dose Route Frequency Provider Last Rate Last  Dose  . 0.9 %  sodium chloride infusion  500 mL Intravenous Continuous Ladene Artist, MD         Objective:  Objective  Physical Exam  Constitutional: He is oriented to person, place, and time. He appears well-developed and well-nourished. No distress.  HENT:  Head: Normocephalic and atraumatic.  Right Ear: External ear normal.  Left Ear: External ear normal.  Nose: Nose normal.  Mouth/Throat: Oropharynx is clear and moist. No oropharyngeal exudate.  Eyes: Pupils are equal, round, and reactive to light. Conjunctivae and EOM are normal. Right eye exhibits no discharge. Left eye exhibits no discharge.  Neck: Normal range of motion. Neck supple. No JVD present. No thyromegaly present.  Cardiovascular: Normal rate, regular rhythm and intact distal pulses.  Exam reveals no gallop and no friction rub.   No murmur heard. Pulmonary/Chest: Effort normal and breath sounds normal. No respiratory distress. He has no wheezes. He has no rales. He exhibits no tenderness.  Abdominal: Soft. Bowel sounds are normal. He exhibits no distension and no mass. There is no tenderness. There is no rebound and no guarding.  Genitourinary: Rectum normal, prostate normal and penis normal. Rectal exam shows guaiac negative stool.  Musculoskeletal: Normal range of motion. He exhibits no edema or tenderness.  Lymphadenopathy:    He has no cervical adenopathy.  Neurological: He is alert and oriented to person, place, and time. He displays normal reflexes. He exhibits normal muscle tone.  Skin: Skin is warm and dry. No rash noted. He is not diaphoretic. No erythema. No pallor.  Psychiatric: He has a normal mood and affect. His behavior is normal. Judgment and thought content normal.  Nursing note and vitals reviewed.  BP 132/74 (BP Location: Right Arm, Patient Position: Sitting, Cuff Size: Normal)   Pulse 64   Temp 97.9 F (36.6 C) (Oral)   Ht 5\' 4"  (1.626 m)   Wt 175 lb 12.8 oz (79.7 kg)   SpO2 97%   BMI 30.18  kg/m  Wt Readings from Last 3 Encounters:  08/25/17 175 lb 12.8 oz (79.7 kg)  10/22/16 178 lb (80.7 kg)  10/08/16 178 lb 12.8 oz (81.1 kg)     Lab Results  Component Value Date   WBC 4.5 03/27/2016   HGB 14.7 03/27/2016   HCT 43.7 03/27/2016   PLT 192.0 03/27/2016   GLUCOSE 93 10/03/2016   CHOL 149 10/03/2016   TRIG 54.0 10/03/2016   HDL 61.80 10/03/2016   LDLDIRECT 173.4 03/16/2013   LDLCALC 76 10/03/2016   ALT 41 10/23/2016   AST 28 10/23/2016   NA 139 10/03/2016   K 4.6 10/03/2016   CL 102 10/03/2016   CREATININE 0.99 10/03/2016   BUN 17 10/03/2016   CO2 30 10/03/2016   TSH 2.28 12/14/2014   PSA 0.16 12/14/2014   INR 1.03 12/12/2009   HGBA1C 6.0 10/03/2016   MICROALBUR 0.6 12/14/2014    Mr  Brain Wo Contrast  Result Date: 02/14/2016 CLINICAL DATA:  Patient woke up with dizziness. Similar episode December 2016. EXAM: MRI HEAD WITHOUT CONTRAST TECHNIQUE: Multiplanar, multiecho pulse sequences of the brain and surrounding structures were obtained without intravenous contrast. COMPARISON:  None. FINDINGS: No evidence for acute infarction, hemorrhage, mass lesion, hydrocephalus, or extra-axial fluid. Global atrophy. Mild to moderate subcortical and periventricular T2 and FLAIR hyperintensities, likely chronic microvascular ischemic change. Pituitary, pineal, and cerebellar tonsils unremarkable. No upper cervical lesions. Flow voids are maintained throughout the carotid, basilar, and vertebral arteries. There are no areas of chronic hemorrhage. Visualized calvarium, skull base, and upper cervical osseous structures unremarkable. Scalp and extracranial soft tissues, orbits, sinuses, and mastoids show no acute process. IMPRESSION: Atrophy and small vessel disease.  No acute intracranial findings. Electronically Signed   By: Staci Righter M.D.   On: 02/14/2016 13:43     Assessment & Plan:  Plan  I am having [name] maintain his LUMIGAN, atorvastatin, amLODipine, multivitamin,  metoprolol, and hydrochlorothiazide. We will continue to administer sodium chloride.  No orders of the defined types were placed in this encounter.   Problem List Items Addressed This Visit      Unprioritized   Essential hypertension    Well controlled, no changes to meds. Encouraged heart healthy diet such as the DASH diet and exercise as tolerated.       Relevant Orders   CBC with Differential/Platelet   POCT Urinalysis Dipstick (Automated) (Completed)   History of prostate cancer    Per urology      Relevant Orders   CBC with Differential/Platelet   POCT Urinalysis Dipstick (Automated) (Completed)   Hyperlipidemia    Tolerating statin, encouraged heart healthy diet, avoid trans fats, minimize simple carbs and saturated fats. Increase exercise as tolerated      Preventative health care - Primary    ghm utd Check labs See aVS       Other Visit Diagnoses    Need for prophylactic vaccination and inoculation against influenza       Relevant Orders   Flu vaccine HIGH DOSE PF (Fluzone High dose) (Completed)   Hyperthyroidism       Relevant Orders   CBC with Differential/Platelet   POCT Urinalysis Dipstick (Automated) (Completed)   Hyperlipidemia LDL goal <100       Relevant Orders   Lipid panel   Comprehensive metabolic panel   POCT Urinalysis Dipstick (Automated) (Completed)   Encounter for Medicare annual wellness exam          Follow-up: Return in about 6 months (around 02/22/2018) for hypertension, hyperlipidemia.  [name], DO

## 2017-08-25 NOTE — Assessment & Plan Note (Signed)
ghm utd ?Check labs  ?See aVS ? ?

## 2017-08-26 LAB — CBC WITH DIFFERENTIAL/PLATELET
Basophils Absolute: 0.1 10*3/uL (ref 0.0–0.1)
Basophils Relative: 1.4 % (ref 0.0–3.0)
EOS ABS: 0.1 10*3/uL (ref 0.0–0.7)
Eosinophils Relative: 1.3 % (ref 0.0–5.0)
HCT: 47 % (ref 39.0–52.0)
HEMOGLOBIN: 15.5 g/dL (ref 13.0–17.0)
Lymphocytes Relative: 33.9 % (ref 12.0–46.0)
Lymphs Abs: 1.7 10*3/uL (ref 0.7–4.0)
MCHC: 33 g/dL (ref 30.0–36.0)
MCV: 98.5 fl (ref 78.0–100.0)
MONO ABS: 0.7 10*3/uL (ref 0.1–1.0)
Monocytes Relative: 14.1 % — ABNORMAL HIGH (ref 3.0–12.0)
Neutro Abs: 2.5 10*3/uL (ref 1.4–7.7)
Neutrophils Relative %: 49.3 % (ref 43.0–77.0)
Platelets: 222 10*3/uL (ref 150.0–400.0)
RBC: 4.78 Mil/uL (ref 4.22–5.81)
RDW: 14.1 % (ref 11.5–15.5)
WBC: 5.1 10*3/uL (ref 4.0–10.5)

## 2017-08-26 LAB — LIPID PANEL
CHOL/HDL RATIO: 3
Cholesterol: 247 mg/dL — ABNORMAL HIGH (ref 0–200)
HDL: 81.9 mg/dL (ref >39.00)
LDL CALC: 150 mg/dL — AB (ref 0–99)
NONHDL: 164.63
Triglycerides: 71 mg/dL (ref 0.0–149.0)
VLDL: 14.2 mg/dL (ref 0.0–40.0)

## 2017-08-26 LAB — COMPREHENSIVE METABOLIC PANEL
ALBUMIN: 4.6 g/dL (ref 3.5–5.2)
ALT: 23 U/L (ref 0–53)
AST: 24 U/L (ref 0–37)
Alkaline Phosphatase: 36 U/L — ABNORMAL LOW (ref 39–117)
BUN: 20 mg/dL (ref 6–23)
CHLORIDE: 102 meq/L (ref 96–112)
CO2: 29 mEq/L (ref 19–32)
CREATININE: 1.06 mg/dL (ref 0.40–1.50)
Calcium: 9.9 mg/dL (ref 8.4–10.5)
GFR: 86.86 mL/min (ref >60.00)
Glucose, Bld: 102 mg/dL — ABNORMAL HIGH (ref 70–99)
Potassium: 3.7 mEq/L (ref 3.5–5.1)
SODIUM: 138 meq/L (ref 135–145)
TOTAL PROTEIN: 7.7 g/dL (ref 6.0–8.3)
Total Bilirubin: 0.6 mg/dL (ref 0.2–1.2)

## 2017-08-31 ENCOUNTER — Telehealth: Payer: Self-pay | Admitting: Family Medicine

## 2017-08-31 ENCOUNTER — Other Ambulatory Visit: Payer: Self-pay | Admitting: *Deleted

## 2017-08-31 MED ORDER — ATORVASTATIN CALCIUM 80 MG PO TABS: 80.0000 mg | ORAL_TABLET | Freq: Every day | ORAL | 3 refills | Status: DC

## 2017-08-31 NOTE — Telephone Encounter (Signed)
Spoke with patient gave lab results & informed that Lipitor has been increased to 80 mg 1 po q hs. Return for labs in 3 months

## 2017-08-31 NOTE — Telephone Encounter (Signed)
Returned call about lab results. [phone]. Call pt back please

## 2017-09-02 NOTE — Telephone Encounter (Signed)
Called pt lmg for him to call and schedule lab appt in three months as provider requested in prior note.

## 2017-09-02 NOTE — Telephone Encounter (Signed)
Pt called back scheduled appt for Lipitor lab fu in three months Nov 25, 2017. Provider to place orders for labs.

## 2017-09-08 DIAGNOSIS — H401133 Primary open-angle glaucoma, bilateral, severe stage: Secondary | ICD-10-CM | POA: Diagnosis not present

## 2017-09-12 ENCOUNTER — Other Ambulatory Visit: Payer: Self-pay | Admitting: Family Medicine

## 2017-09-23 DIAGNOSIS — L910 Hypertrophic scar: Secondary | ICD-10-CM | POA: Diagnosis not present

## 2017-11-09 ENCOUNTER — Ambulatory Visit (HOSPITAL_BASED_OUTPATIENT_CLINIC_OR_DEPARTMENT_OTHER)
Admission: RE | Admit: 2017-11-09 | Discharge: 2017-11-09 | Disposition: A | Discharge status: Home / Self Care | Payer: Medicare Other | Source: Ambulatory Visit | Attending: Medical | Admitting: Medical

## 2017-11-09 ENCOUNTER — Ambulatory Visit (INDEPENDENT_AMBULATORY_CARE_PROVIDER_SITE_OTHER): Payer: Medicare Other | Admitting: Medical

## 2017-11-09 ENCOUNTER — Encounter: Payer: Self-pay | Admitting: Medical

## 2017-11-09 VITALS — BP 163/66 | HR 60 | Temp 97.9°F | Resp 16 | Ht 64.0 in | Wt 178.0 lb

## 2017-11-09 DIAGNOSIS — M898X1 Other specified disorders of bone, shoulder: Secondary | ICD-10-CM | POA: Diagnosis not present

## 2017-11-09 DIAGNOSIS — R059 Cough, unspecified: Secondary | ICD-10-CM

## 2017-11-09 DIAGNOSIS — R221 Localized swelling, mass and lump, neck: Secondary | ICD-10-CM

## 2017-11-09 DIAGNOSIS — E7849 Other hyperlipidemia: Secondary | ICD-10-CM | POA: Diagnosis not present

## 2017-11-09 DIAGNOSIS — M542 Cervicalgia: Secondary | ICD-10-CM | POA: Diagnosis not present

## 2017-11-09 DIAGNOSIS — R42 Dizziness and giddiness: Secondary | ICD-10-CM

## 2017-11-09 DIAGNOSIS — R05 Cough: Secondary | ICD-10-CM

## 2017-11-09 DIAGNOSIS — J40 Bronchitis, not specified as acute or chronic: Secondary | ICD-10-CM | POA: Diagnosis not present

## 2017-11-09 DIAGNOSIS — M19011 Primary osteoarthritis, right shoulder: Secondary | ICD-10-CM | POA: Diagnosis not present

## 2017-11-09 DIAGNOSIS — R222 Localized swelling, mass and lump, trunk: Secondary | ICD-10-CM | POA: Diagnosis not present

## 2017-11-09 LAB — CBC WITH DIFFERENTIAL/PLATELET
BASOS PCT: 1.1 % (ref 0.0–3.0)
Basophils Absolute: 0.1 10*3/uL (ref 0.0–0.1)
EOS ABS: 0.1 10*3/uL (ref 0.0–0.7)
EOS PCT: 1.1 % (ref 0.0–5.0)
HEMATOCRIT: 46 % (ref 39.0–52.0)
Hemoglobin: 15.5 g/dL (ref 13.0–17.0)
LYMPHS PCT: 31.6 % (ref 12.0–46.0)
Lymphs Abs: 1.8 10*3/uL (ref 0.7–4.0)
MCHC: 33.7 g/dL (ref 30.0–36.0)
MCV: 97.7 fl (ref 78.0–100.0)
MONO ABS: 0.9 10*3/uL (ref 0.1–1.0)
Monocytes Relative: 16.1 % — ABNORMAL HIGH (ref 3.0–12.0)
Neutro Abs: 2.8 10*3/uL (ref 1.4–7.7)
Neutrophils Relative %: 50.1 % (ref 43.0–77.0)
Platelets: 208 10*3/uL (ref 150.0–400.0)
RBC: 4.71 Mil/uL (ref 4.22–5.81)
RDW: 14 % (ref 11.5–15.5)
WBC: 5.6 10*3/uL (ref 4.0–10.5)

## 2017-11-09 MED ORDER — AZITHROMYCIN 250 MG PO TABS: ORAL_TABLET | ORAL | 0 refills | Status: DC

## 2017-11-09 NOTE — Patient Instructions (Addendum)
For cough and possible bronchitis will rx azithromycin antibiotic and benzonatate. Also get cxr today.  For dizziness will get cbc and cmp. Currently very good neurologic exam. If dizziness returns severe or associated symptoms then ED evaluation.  Currently bp mild high. Check bp every other day. If bp is over 140/90 let us know. May add new medication or advise increase hctz to 25 mg a day.  For clavicle and neck pain get clavicle xray and [place] of neck.  Follow up in 4 days or as needed

## 2017-11-09 NOTE — Progress Notes (Signed)
Subjective:    Patient ID: [name] Gibson, male    DOB: 1940/12/30, 76 y.o.   MRN: [id number]  HPI    Pt in states past Thursday felt dizziness. He states every day since Thursday having intermittent episodes last for about 5-10 minutes. Can occur sitting or standing. Sometimes on standing but not occurring only on standing. Pt states his bp recently at home was 125/75. With dizziness no nausea, no vomiting, no ha, no vision changes or any gross motor or sensory function deficits. Last time felt dizzy was this morning.(No gross motor or sensory function deficits)   He states some coughing  recently and some runny nose. This morning was productive cough this morning. But cough has been for one month. Pt mentions did get flu vaccine. Some sneezing occasional associated with cough. No recent fevers, no chills or sweats. Remote history of smoking 76 yr old. 1 pack a day for 3 months.    He also mentions neck pain. He states had this pain since September. He states mentioned to his pcp then. He points to base of neck rt side and clavicle. No chest pain.   Review of Systems  Constitutional: Negative for appetite change, chills and diaphoresis.  HENT: Positive for postnasal drip. Negative for mouth sores, rhinorrhea, sinus pain and trouble swallowing.   Respiratory: Positive for cough. Negative for chest tightness, shortness of breath and wheezing.        Some productive cough. Cough present for one months.  Gastrointestinal: Negative for abdominal pain, blood in stool and diarrhea.  Musculoskeletal: Negative for back pain.  Skin: Negative for rash.  Neurological: Negative for dizziness, seizures, speech difficulty, weakness and light-headedness.       This morning last felt dizzy.  But none since.  Hematological: Negative for adenopathy. Does not bruise/bleed easily.  Psychiatric/Behavioral: Negative for behavioral problems, confusion, dysphoric mood, hallucinations and suicidal ideas. The  patient is not nervous/anxious.     Past Medical History:  Diagnosis Date  . ANGIOEDEMA 08/25/2007   Qualifier: Diagnosis of  By: Jerold Coombe    . ANKLE PAIN, RIGHT 11/16/2008   Qualifier: Diagnosis of  By: Jerold Coombe    . Cancer [facility])    prostate  . COLONIC POLYPS 10/22/2007   Qualifier: Diagnosis of  By: Jerold Coombe    . DIVERTICULOSIS OF COLON 10/22/2007   Qualifier: Diagnosis of  By: Jerold Coombe    . GLAUCOMA 03/23/2007   Qualifier: Diagnosis of  By: Jerold Coombe    . Glaucoma   . GLUCOSE INTOLERANCE, HX OF 03/23/2007   Qualifier: Diagnosis of  By: Jerold Coombe    . Hyperlipidemia   . HYPERLIPIDEMIA 03/23/2007   Qualifier: Diagnosis of  By: Jerold Coombe    . Hypertension   . HYPERTENSION 09/08/2007   Qualifier: Diagnosis of  By: Jerold Coombe    . HYPERTHYROIDISM 03/23/2007   Qualifier: Diagnosis of  By: Jerold Coombe  - no med  . Obesity (BMI 30-39.9) 12/07/2013  . PROSTATE CANCER, HX OF 10/02/2010   Qualifier: Diagnosis of  By: Jerold Coombe    . PSA, INCREASED 08/08/2009   Qualifier: Diagnosis of  By: Jerold Coombe    . Right shoulder pain 02/08/2015  . Sebaceous cyst 12/13/2008   Qualifier: Diagnosis of  By: Jerold Coombe    . SINUSITIS- ACUTE-NOS 09/12/2008   Qualifier: Diagnosis of  By: Jerold Coombe    .  URI 12/13/2008   Qualifier: Diagnosis of  By: Jerold Coombe       Social History   Socioeconomic History  . Marital status: Divorced    Spouse name: Not on file  . Number of children: Not on file  . Years of education: Not on file  . Highest education level: Not on file  Social Needs  . Financial resource strain: Not on file  . Food insecurity - worry: Not on file  . Food insecurity - inability: Not on file  . Transportation needs - medical: Not on file  . Transportation needs - non-medical: Not on file  Occupational History  . Occupation: Diplomatic Services operational officer: [employer]  Tobacco Use  . Smoking status:  Never Smoker  . Smokeless tobacco: Never Used  Substance and Sexual Activity  . Alcohol use: Yes    Alcohol/week: 0.6 oz    Types: 1 Shots of liquor per week  . Drug use: No  . Sexual activity: Yes    Partners: Female  Other Topics Concern  . Not on file  Social History Narrative   Reg exercise--walking 2-3 x a week    Past Surgical History:  Procedure Laterality Date  . APPENDECTOMY    . radioactive seed implation     prostate    Family History  Problem Relation Age of Onset  . Brain cancer Brother   . Diabetes Other   . Hyperlipidemia Other   . Hypertension Other   . Coronary artery disease Other   . Cancer Father        spine  . Stomach cancer Sister   . Kidney failure Brother   . Diabetes Mellitus II Brother   . Throat cancer Brother   . Lung cancer Brother   . Stroke Brother   . Stroke Brother   . Stroke Sister   . Breast cancer Sister   . Colon cancer Maternal Grandmother     Allergies  Allergen Reactions  . Penicillins     REACTION: unspecified    Current Outpatient Medications on File Prior to Visit  Medication Sig Dispense Refill  . amLODipine (NORVASC) 10 MG tablet Take 1 tablet (10 mg total) by mouth daily. 90 tablet 1  . amLODipine (NORVASC) 10 MG tablet TAKE 1 TABLET BY MOUTH DAILY 90 tablet 1  . atorvastatin (LIPITOR) 40 MG tablet Take 1 tablet (40 mg total) by mouth at bedtime. 30 tablet 5  . atorvastatin (LIPITOR) 80 MG tablet Take 1 tablet (80 mg total) by mouth daily. 90 tablet 3  . hydrochlorothiazide (MICROZIDE) 12.5 MG capsule TAKE 1 CAPSULE BY MOUTH DAILY 90 capsule 0  . LUMIGAN 0.01 % SOLN Place 1 drop into both eyes at bedtime.   4  . metoprolol (TOPROL-XL) 200 MG 24 hr tablet TAKE 1 TABLET BY MOUTH DAILY 90 tablet 1   Current Facility-Administered Medications on File Prior to Visit  Medication Dose Route Frequency Provider Last Rate Last Dose  . 0.9 %  sodium chloride infusion  500 mL Intravenous Continuous Ladene Artist, MD         BP (!) 163/66   Pulse 60   Temp 97.9 F (36.6 C) (Oral)   Resp 16   Ht 5\' 4"  (1.626 m)   Wt 178 lb (80.7 kg)   SpO2 100%   BMI 30.55 kg/m       Objective:   Physical Exam  General  Mental Status - Alert. General Appearance - Well  groomed. Not in acute distress.  Skin Rashes- No Rashes.  HEENT Head- Normal. Ear Auditory Canal - Left- Normal. Right - Normal.Tympanic Membrane- Left- Normal. Right- Normal. Eye Sclera/Conjunctiva- Left- Normal. Right- Normal. Nose & Sinuses Nasal Mucosa- Left- Not  Boggy and Congested. Right-   Not Boggy and  Congested.Bilateral no maxillary and no  frontal sinus pressure. Mouth & Throat Lips: Upper Lip- Normal: no dryness, cracking, pallor, cyanosis, or vesicular eruption. Lower Lip-Normal: no dryness, cracking, pallor, cyanosis or vesicular eruption. Buccal Mucosa- Bilateral- No Aphthous ulcers. Oropharynx- No Discharge or Erythema. Tonsils: Characteristics- Bilateral- No Erythema or Congestion. Size/Enlargement- Bilateral- No enlargement. Discharge- bilateral-None.  Neck Neck- Supple. No Masses. At base of neck. Sternoclavicular junction rt side feels swollen and irregular irregular . Also at base of neck feels mild swollen and faint tender.   Chest and Lung Exam Auscultation: Breath Sounds:-Clear even and unlabored.  Cardiovascular Auscultation:Rythm- Regular, rate and rhythm. Murmurs & Other Heart Sounds:Ausculatation of the heart reveal- No Murmurs.  Lymphatic Head & Neck General Head & Neck Lymphatics: Bilateral: Description- No Localized lymphadenopathy.   Neurologic Cranial Nerve exam:- CN III-XII intact(No nystagmus), symmetric smile. Drift Test:- No drift. Romberg Exam:- Negative.  Heal to Toe Gait exam:-Normal. Finger to Nose:- Normal/Intact Strength:- 5/5 equal and symmetric strength both upper and lower extremities.      Assessment & Plan:  For cough and possible bronchitis will rx azithromycin antibiotic  and benzonatate. Also get cxr today.  For dizziness will get cbc and cmp. Currently very good neurologic exam. If dizziness returns severe or associated symptoms then ED evaluation.  Currently bp mild high. Check bp every other day. If bp is over 140/90 let us know. May add new medication or advise increase hctz to 25 mg a day.  For clavicle and neck pain get clavicle xray and [place] of neck.  Follow up in 4 days or as needed  Note today based on his negative neurologic exam and absence at the time of the exam, I did not see the need to get any imaging studies of head.  Dorthie Santini, Percell Miller, PA-C

## 2017-11-10 ENCOUNTER — Telehealth: Payer: Self-pay | Admitting: Medical

## 2017-11-10 LAB — COMPREHENSIVE METABOLIC PANEL
ALBUMIN: 4.7 g/dL (ref 3.5–5.2)
ALT: 43 U/L (ref 0–53)
AST: 29 U/L (ref 0–37)
Alkaline Phosphatase: 42 U/L (ref 39–117)
BUN: 19 mg/dL (ref 6–23)
CALCIUM: 10.5 mg/dL (ref 8.4–10.5)
CHLORIDE: 100 meq/L (ref 96–112)
CO2: 31 meq/L (ref 19–32)
CREATININE: 1.06 mg/dL (ref 0.40–1.50)
GFR: 86.81 mL/min (ref >60.00)
Glucose, Bld: 98 mg/dL (ref 70–99)
POTASSIUM: 5.1 meq/L (ref 3.5–5.1)
SODIUM: 138 meq/L (ref 135–145)
Total Bilirubin: 0.5 mg/dL (ref 0.2–1.2)
Total Protein: 7.8 g/dL (ref 6.0–8.3)

## 2017-11-10 LAB — LIPID PANEL
CHOL/HDL RATIO: 3
CHOLESTEROL: 155 mg/dL (ref 0–200)
HDL: 53.1 mg/dL (ref >39.00)
LDL CALC: 91 mg/dL (ref 0–99)
NonHDL: 101.78
TRIGLYCERIDES: 53 mg/dL (ref 0.0–149.0)
VLDL: 10.6 mg/dL (ref 0.0–40.0)

## 2017-11-10 MED ORDER — BENZONATATE 100 MG PO CAPS: 100.0000 mg | ORAL_CAPSULE | Freq: Three times a day (TID) | ORAL | 0 refills | Status: DC | PRN

## 2017-11-10 NOTE — Telephone Encounter (Signed)
Sent benzonatate to pt pharmacy.

## 2017-11-16 ENCOUNTER — Ambulatory Visit: Payer: Medicare Other | Admitting: Medical

## 2017-11-19 ENCOUNTER — Ambulatory Visit (INDEPENDENT_AMBULATORY_CARE_PROVIDER_SITE_OTHER): Payer: Medicare Other | Admitting: Family Medicine

## 2017-11-19 ENCOUNTER — Encounter: Payer: Self-pay | Admitting: Family Medicine

## 2017-11-19 VITALS — BP 141/71 | HR 63

## 2017-11-19 DIAGNOSIS — J069 Acute upper respiratory infection, unspecified: Secondary | ICD-10-CM | POA: Diagnosis not present

## 2017-11-19 DIAGNOSIS — R42 Dizziness and giddiness: Secondary | ICD-10-CM | POA: Diagnosis not present

## 2017-11-19 LAB — GLUCOSE, POCT (MANUAL RESULT ENTRY): POC Glucose: 97 mg/dl (ref 70–99)

## 2017-11-19 MED ORDER — FLUTICASONE PROPIONATE 50 MCG/ACT NA SUSP: 2.0000 | Freq: Every day | NASAL | 6 refills | Status: DC

## 2017-11-19 MED ORDER — LEVOCETIRIZINE DIHYDROCHLORIDE 5 MG PO TABS: 5.0000 mg | ORAL_TABLET | Freq: Every evening | ORAL | 5 refills | Status: DC

## 2017-11-19 NOTE — Patient Instructions (Signed)

## 2017-11-19 NOTE — Assessment & Plan Note (Signed)
?   If from uri symptoms---  Take xyzal and flonase Consider ent referral Pt has seen neuro and cardiology--- w/u normal ekg today-- no acute changes from previous

## 2017-11-19 NOTE — Progress Notes (Signed)
Patient ID: [name] Gibson, male   DOB: Jan 18, 1941, 76 y.o.   MRN: [id number]     Subjective:  I acted as a Education administrator for Dr. Carollee Herter.  Guerry Bruin, [place]   Patient ID: [name] Gibson, male    DOB: 1941-07-10, 76 y.o.   MRN: [id number]  Chief Complaint  Patient presents with  . Follow-up    cough, dizziness    HPI  Patient is in today for follow up dizziness and cough.  Cough is a little better.  He is still having dizziness.   Symptoms started 11/29-- he was seen here on 12/3 and given z pack -- cxr + bronchitis ,  ekg --no acute changes No cp, no palpitations.   Cough is productive-- white mucous--- it has improved.  Pt feels like he is spinning when he gets dizzy.  It lasts a 3-5 minutes.    Patient Care Team: Carollee Herter, Alferd Apa, DO as PCP - General Carolan Clines, MD as Consulting Physician (Urology) Marylynn Pearson, MD as Consulting Physician (Ophthalmology) Pieter Partridge, DO as Consulting Physician (Neurology) Nahser, Wonda Cheng, MD as Consulting Physician (Cardiology) Levy Sjogren, MD as Referring Physician (Dermatology)   Past Medical History:  Diagnosis Date  . ANGIOEDEMA 08/25/2007   Qualifier: Diagnosis of  By: Jerold Coombe    . ANKLE PAIN, RIGHT 11/16/2008   Qualifier: Diagnosis of  By: Jerold Coombe    . Cancer [facility])    prostate  . COLONIC POLYPS 10/22/2007   Qualifier: Diagnosis of  By: Jerold Coombe    . DIVERTICULOSIS OF COLON 10/22/2007   Qualifier: Diagnosis of  By: Jerold Coombe    . GLAUCOMA 03/23/2007   Qualifier: Diagnosis of  By: Jerold Coombe    . Glaucoma   . GLUCOSE INTOLERANCE, HX OF 03/23/2007   Qualifier: Diagnosis of  By: Jerold Coombe    . Hyperlipidemia   . HYPERLIPIDEMIA 03/23/2007   Qualifier: Diagnosis of  By: Jerold Coombe    . Hypertension   . HYPERTENSION 09/08/2007   Qualifier: Diagnosis of  By: Jerold Coombe    . HYPERTHYROIDISM 03/23/2007   Qualifier: Diagnosis of  By: Jerold Coombe  - no med  .  Obesity (BMI 30-39.9) 12/07/2013  . PROSTATE CANCER, HX OF 10/02/2010   Qualifier: Diagnosis of  By: Jerold Coombe    . PSA, INCREASED 08/08/2009   Qualifier: Diagnosis of  By: Jerold Coombe    . Right shoulder pain 02/08/2015  . Sebaceous cyst 12/13/2008   Qualifier: Diagnosis of  By: Jerold Coombe    . SINUSITIS- ACUTE-NOS 09/12/2008   Qualifier: Diagnosis of  By: Jerold Coombe    . URI 12/13/2008   Qualifier: Diagnosis of  By: Jerold Coombe      Past Surgical History:  Procedure Laterality Date  . APPENDECTOMY    . radioactive seed implation     prostate    Family History  Problem Relation Age of Onset  . Brain cancer Brother   . Diabetes Other   . Hyperlipidemia Other   . Hypertension Other   . Coronary artery disease Other   . Cancer Father        spine  . Stomach cancer Sister   . Kidney failure Brother   . Diabetes Mellitus II Brother   . Throat cancer Brother   . Lung cancer Brother   . Stroke Brother   . Stroke Brother   .  Stroke Sister   . Breast cancer Sister   . Colon cancer Maternal Grandmother     Social History   Socioeconomic History  . Marital status: Divorced    Spouse name: Not on file  . Number of children: Not on file  . Years of education: Not on file  . Highest education level: Not on file  Social Needs  . Financial resource strain: Not on file  . Food insecurity - worry: Not on file  . Food insecurity - inability: Not on file  . Transportation needs - medical: Not on file  . Transportation needs - non-medical: Not on file  Occupational History  . Occupation: Diplomatic Services operational officer: [employer]  Tobacco Use  . Smoking status: Never Smoker  . Smokeless tobacco: Never Used  Substance and Sexual Activity  . Alcohol use: Yes    Alcohol/week: 0.6 oz    Types: 1 Shots of liquor per week  . Drug use: No  . Sexual activity: Yes    Partners: Female  Other Topics Concern  . Not on file  Social History Narrative   Reg  exercise--walking 2-3 x a week    Outpatient Medications Prior to Visit  Medication Sig Dispense Refill  . amLODipine (NORVASC) 10 MG tablet Take 1 tablet (10 mg total) by mouth daily. 90 tablet 1  . atorvastatin (LIPITOR) 40 MG tablet Take 1 tablet (40 mg total) by mouth at bedtime. 30 tablet 5  . atorvastatin (LIPITOR) 80 MG tablet Take 1 tablet (80 mg total) by mouth daily. 90 tablet 3  . benzonatate (TESSALON) 100 MG capsule Take 1 capsule (100 mg total) by mouth 3 (three) times daily as needed. 21 capsule 0  . hydrochlorothiazide (MICROZIDE) 12.5 MG capsule TAKE 1 CAPSULE BY MOUTH DAILY 90 capsule 0  . LUMIGAN 0.01 % SOLN Place 1 drop into both eyes at bedtime.   4  . metoprolol (TOPROL-XL) 200 MG 24 hr tablet TAKE 1 TABLET BY MOUTH DAILY 90 tablet 1  . amLODipine (NORVASC) 10 MG tablet TAKE 1 TABLET BY MOUTH DAILY 90 tablet 1  . azithromycin (ZITHROMAX) 250 MG tablet Take 2 tablets by mouth on day 1, followed by 1 tablet by mouth daily for 4 days. 6 tablet 0   Facility-Administered Medications Prior to Visit  Medication Dose Route Frequency Provider Last Rate Last Dose  . 0.9 %  sodium chloride infusion  500 mL Intravenous Continuous Ladene Artist, MD        Allergies  Allergen Reactions  . Penicillins     REACTION: unspecified    Review of Systems  Constitutional: Negative for fever and malaise/fatigue.  HENT: Positive for congestion.   Eyes: Negative for blurred vision.  Respiratory: Positive for cough. Negative for shortness of breath.   Cardiovascular: Negative for chest pain, palpitations and leg swelling.  Gastrointestinal: Negative for vomiting.  Musculoskeletal: Negative for back pain.  Skin: Negative for rash.  Neurological: Positive for dizziness. Negative for loss of consciousness and headaches.       Objective:    Physical Exam  Constitutional: He is oriented to person, place, and time. Vital signs are normal. He appears well-developed and  well-nourished. He is sleeping.  HENT:  Head: Normocephalic and atraumatic.  Mouth/Throat: Oropharynx is clear and moist.  Eyes: EOM are normal. Pupils are equal, round, and reactive to light.  Neck: Normal range of motion. Neck supple. No thyromegaly present.  Cardiovascular: Normal rate and regular rhythm.  No  murmur heard. Pulmonary/Chest: Effort normal and breath sounds normal. No respiratory distress. He has no wheezes. He has no rales. He exhibits no tenderness.  Musculoskeletal: He exhibits no edema or tenderness.  Neurological: He is alert and oriented to person, place, and time.  Skin: Skin is warm and dry.  Psychiatric: He has a normal mood and affect. His behavior is normal. Judgment and thought content normal.  Nursing note and vitals reviewed.   BP (!) 141/71   Pulse 63  Wt Readings from Last 3 Encounters:  11/09/17 178 lb (80.7 kg)  08/25/17 175 lb 12.8 oz (79.7 kg)  10/22/16 178 lb (80.7 kg)   BP Readings from Last 3 Encounters:  11/19/17 (!) 141/71  11/09/17 (!) 163/66  08/25/17 132/74     Immunization History  Administered Date(s) Administered  . Influenza Split 10/15/2011  . Influenza Whole 10/02/2010  . Influenza, High Dose Seasonal PF 09/20/2014, 08/31/2015, 10/03/2016, 08/25/2017  . Influenza,inj,Quad PF,6+ Mos 12/07/2013  . Pneumococcal Conjugate-13 09/20/2014  . Pneumococcal Polysaccharide-23 04/23/2005, 10/17/2015  . Td 01/15/2000, 10/02/2010  . Zoster 07/22/2006    Health Maintenance  Topic Date Due  . Samul Dada  10/02/2020  . COLONOSCOPY  10/22/2021  . INFLUENZA VACCINE  Completed  . PNA vac Low Risk Adult  Completed    Lab Results  Component Value Date   WBC 5.6 11/09/2017   HGB 15.5 11/09/2017   HCT 46.0 11/09/2017   PLT 208.0 11/09/2017   GLUCOSE 98 11/09/2017   CHOL 155 11/09/2017   TRIG 53.0 11/09/2017   HDL 53.10 11/09/2017   LDLDIRECT 173.4 03/16/2013   LDLCALC 91 11/09/2017   ALT 43 11/09/2017   AST 29 11/09/2017   NA  138 11/09/2017   K 5.1 11/09/2017   CL 100 11/09/2017   CREATININE 1.06 11/09/2017   BUN 19 11/09/2017   CO2 31 11/09/2017   TSH 2.28 12/14/2014   PSA 0.16 12/14/2014   INR 1.03 12/12/2009   HGBA1C 6.0 10/03/2016   MICROALBUR 0.6 12/14/2014    Lab Results  Component Value Date   TSH 2.28 12/14/2014   Lab Results  Component Value Date   WBC 5.6 11/09/2017   HGB 15.5 11/09/2017   HCT 46.0 11/09/2017   MCV 97.7 11/09/2017   PLT 208.0 11/09/2017   Lab Results  Component Value Date   NA 138 11/09/2017   K 5.1 11/09/2017   CO2 31 11/09/2017   GLUCOSE 98 11/09/2017   BUN 19 11/09/2017   CREATININE 1.06 11/09/2017   BILITOT 0.5 11/09/2017   ALKPHOS 42 11/09/2017   AST 29 11/09/2017   ALT 43 11/09/2017   PROT 7.8 11/09/2017   ALBUMIN 4.7 11/09/2017   CALCIUM 10.5 11/09/2017   GFR 86.81 11/09/2017   Lab Results  Component Value Date   CHOL 155 11/09/2017   Lab Results  Component Value Date   HDL 53.10 11/09/2017   Lab Results  Component Value Date   LDLCALC 91 11/09/2017   Lab Results  Component Value Date   TRIG 53.0 11/09/2017   Lab Results  Component Value Date   CHOLHDL 3 11/09/2017   Lab Results  Component Value Date   HGBA1C 6.0 10/03/2016         Assessment & Plan:   Problem List Items Addressed This Visit      Unprioritized   Dizziness - Primary    ? If from uri symptoms---  Take xyzal and flonase Consider ent referral Pt has seen neuro and  cardiology--- w/u normal ekg today-- no acute changes from previous       Relevant Medications   fluticasone (FLONASE) 50 MCG/ACT nasal spray   Other Relevant Orders   EKG 12-Lead (Completed)   POCT Glucose (CBG) (Completed)    Other Visit Diagnoses    Upper respiratory tract infection, unspecified type       Relevant Medications   levocetirizine (XYZAL) 5 MG tablet      I have discontinued Algie [name]'s azithromycin. I am also having him start on levocetirizine and fluticasone.  Additionally, I am having him maintain his LUMIGAN, atorvastatin, amLODipine, hydrochlorothiazide, atorvastatin, metoprolol, and benzonatate. We will continue to administer sodium chloride.  Meds ordered this encounter  Medications  . levocetirizine (XYZAL) 5 MG tablet    Sig: Take 1 tablet (5 mg total) by mouth every evening.    Dispense:  30 tablet    Refill:  5  . fluticasone (FLONASE) 50 MCG/ACT nasal spray    Sig: Place 2 sprays into both nostrils daily.    Dispense:  16 g    Refill:  6    CMA served as scribe during this visit. History, Physical and Plan performed by medical provider. Documentation and orders reviewed and attested to.  [name], DO

## 2017-11-25 ENCOUNTER — Other Ambulatory Visit: Payer: Medicare Other

## 2017-12-15 DIAGNOSIS — H401133 Primary open-angle glaucoma, bilateral, severe stage: Secondary | ICD-10-CM | POA: Diagnosis not present

## 2017-12-30 ENCOUNTER — Telehealth: Payer: Self-pay | Admitting: Family Medicine

## 2017-12-30 DIAGNOSIS — L910 Hypertrophic scar: Secondary | ICD-10-CM | POA: Diagnosis not present

## 2017-12-30 NOTE — Telephone Encounter (Signed)
Copied from [place]. Topic: Quick Communication - See Telephone Encounter >> Dec 30, 2017  1:25 PM Cleaster Corin, [place] wrote: CRM for notification. See Telephone encounter for:   12/30/17. Pt. Calling and he went to a dermatologist and they told him that he should be seen by a bone specialist pt. Would like to get a referral to go to a bone specialist. Pt. Can be reached at [phone]

## 2018-01-01 NOTE — Telephone Encounter (Signed)
Pt returned call. I asked pt what do he need to see a bone specialist for, pt says that Dr. Etter Sjogren already know what bone. He said that it is the same bone that he was sent to a dermatologist for.   I advised pt that someone may be calling him back to get more clarity.

## 2018-01-01 NOTE — Telephone Encounter (Signed)
Left message to call back need to see what he needs to see a bone specialist for.

## 2018-01-04 NOTE — Telephone Encounter (Signed)
Left message on machine.  Need to get what he was seen for at the the dermatologist, what part of the body?  We will be happy to send the referral.

## 2018-01-06 NOTE — Telephone Encounter (Signed)
Left message on machine to call back  

## 2018-01-17 IMAGING — MR MR HEAD W/O CM
10 series · 48 of 48 positions shown · non-contrast
Comparison: None.

CLINICAL DATA: Patient woke up with dizziness. Similar episode
November 2015.

EXAM:
MRI HEAD WITHOUT CONTRAST
TECHNIQUE: Multiplanar, multiecho pulse sequences of the brain and surrounding
structures were obtained without intravenous contrast.

[Series 2: t1_se_sag · sagittal · 5.0mm · 0.47mm/px · 3 of 21 slices shown]
[im 1/21]
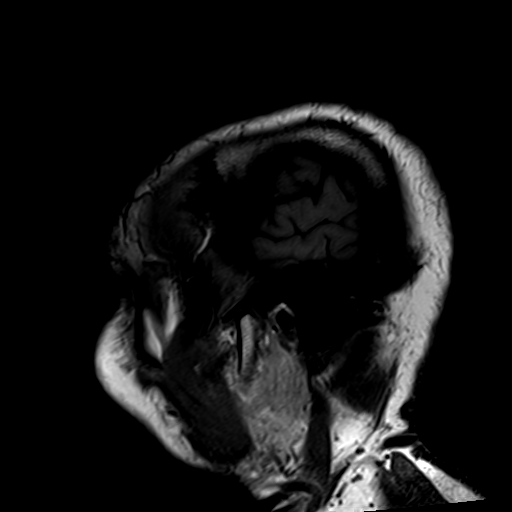
[im 11/21]
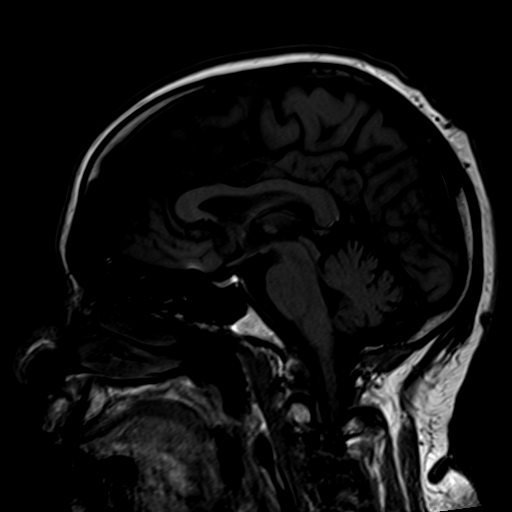
[im 21/21]
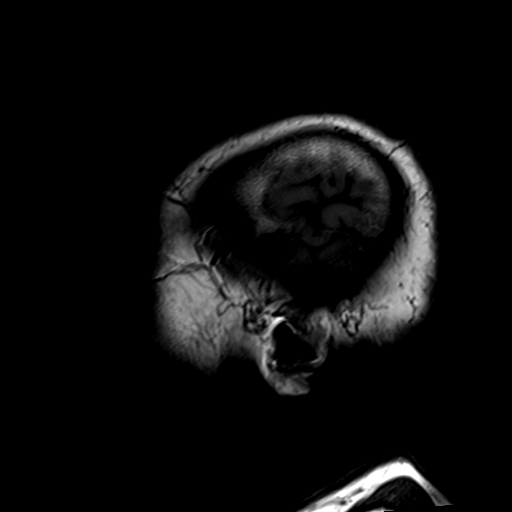

[Series 3: ep2d_diff_(id)_trace · axial · 3.0mm · 1.80mm/px · z∈[-73,+74]mm · 10 of 100 slices shown]
[im 1/100]
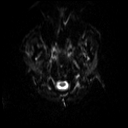
[im 12/100]
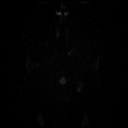
[im 23/100]
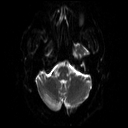
[im 34/100]
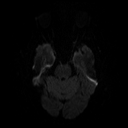
[im 45/100]
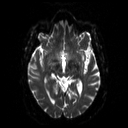
[im 56/100]
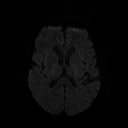
[im 67/100]
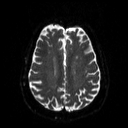
[im 78/100]
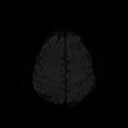
[im 89/100]
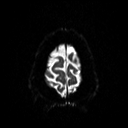
[im 100/100]
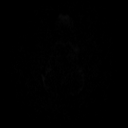

[Series 4: ep2d_diff_(id)_trace_adc · axial · 3.0mm · 1.80mm/px · z∈[-73,+74]mm · 5 of 48 slices shown]
[im 1/48]
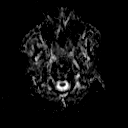
[im 12/48]
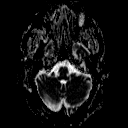
[im 24/48]
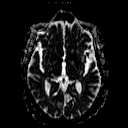
[im 36/48]
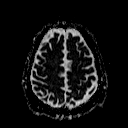
[im 48/48]
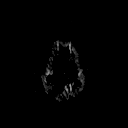

[Series 6: swi_images · axial · 2.0mm · 0.90mm/px · z∈[-78,+80]mm · 8 of 80 slices shown]
[im 1/80]
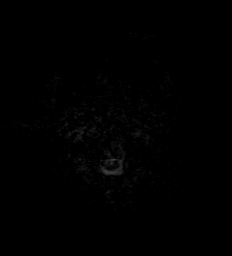
[im 12/80]
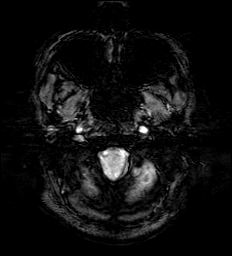
[im 23/80]
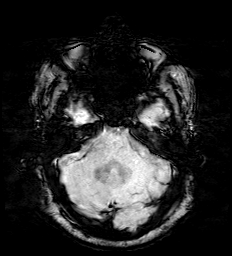
[im 34/80]
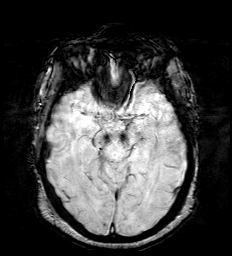
[im 46/80]
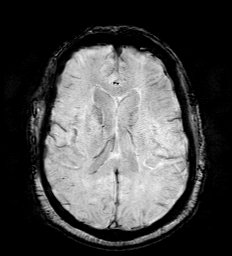
[im 57/80]
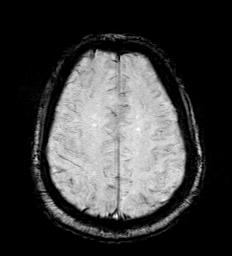
[im 68/80]
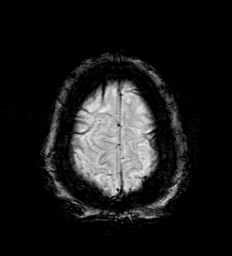
[im 80/80]
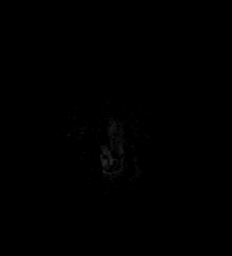

[Series 7: ep2d_diff_cor · coronal · 5.0mm · 1.77mm/px · 5 of 48 slices shown]
[im 1/48]
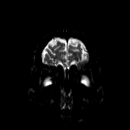
[im 12/48]
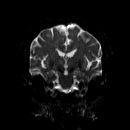
[im 24/48]
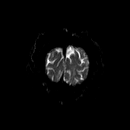
[im 36/48]
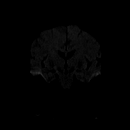
[im 48/48]
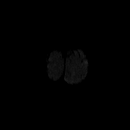

[Series 8: ep2d_diff_cor_adc · coronal · 5.0mm · 1.77mm/px · 2 of 24 slices shown]
[im 1/24]
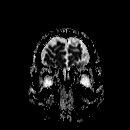
[im 24/24]
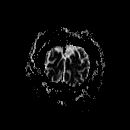

[Series 9: FLAIR · axial · 5.0mm · 0.45mm/px · z∈[-68,+69]mm · 2 of 23 slices shown]
[im 1/23]
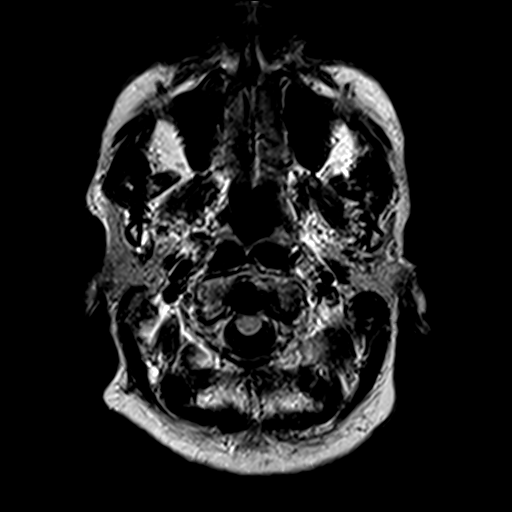
[im 23/23]
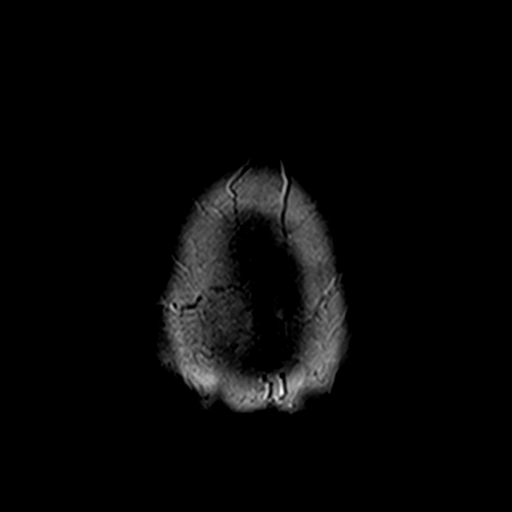

[Series 10: t2_tse_tra_512 · axial · 5.0mm · 0.60mm/px · z∈[-67,+70]mm · 2 of 23 slices shown]
[im 1/23]
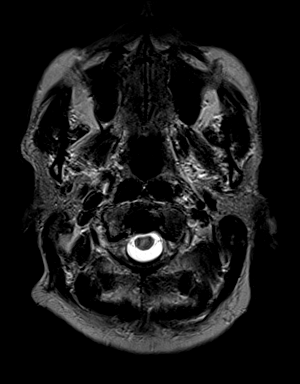
[im 23/23]
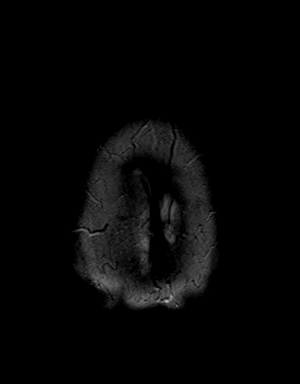

[Series 11: t1_mpr_tra · axial · 2.0mm · 0.45mm/px · z∈[-77,+81]mm · 8 of 80 slices shown]
[im 1/80]
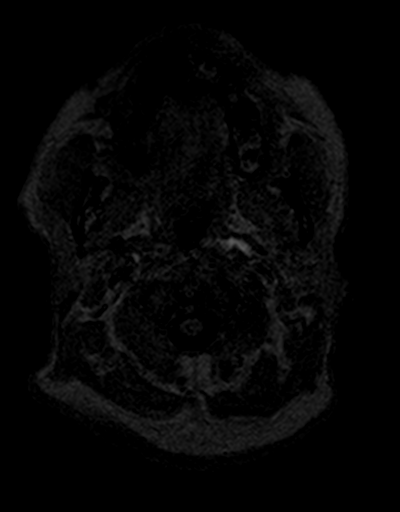
[im 12/80]
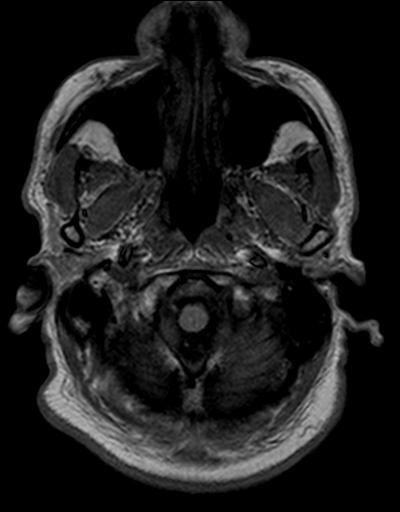
[im 23/80]
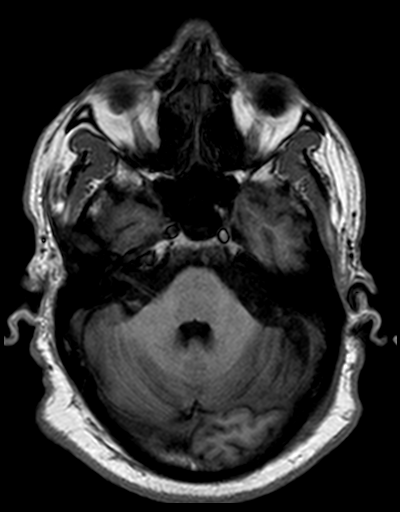
[im 34/80]
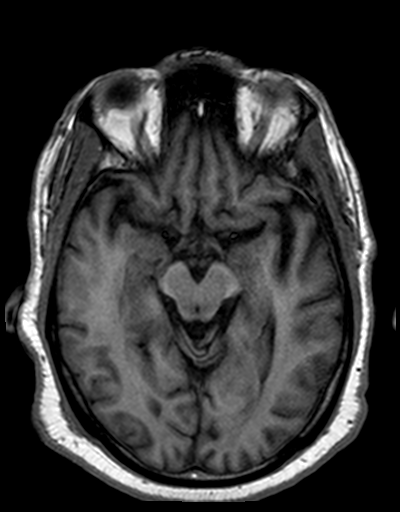
[im 46/80]
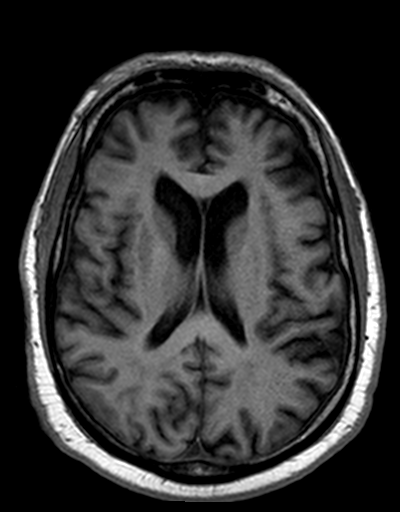
[im 57/80]
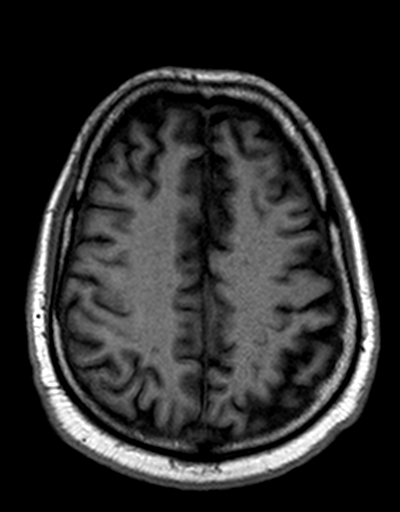
[im 68/80]
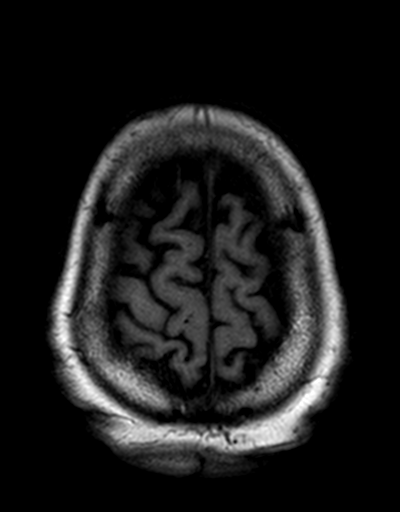
[im 80/80]
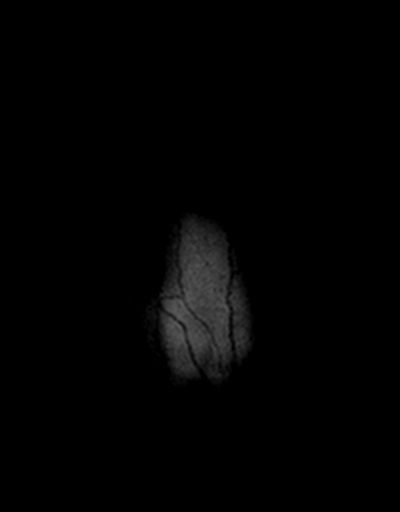

[Series 12: T2 · coronal · 5.0mm · 0.45mm/px · 3 of 25 slices shown]
[im 1/25]
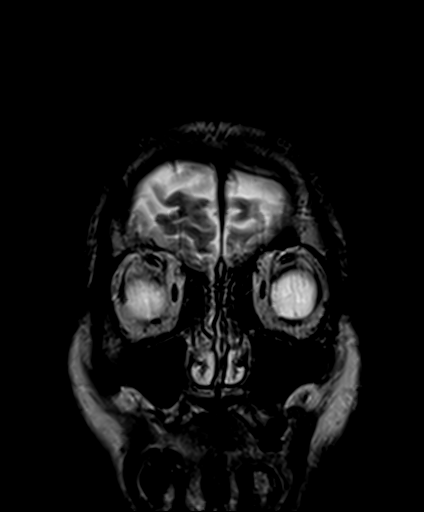
[im 13/25]
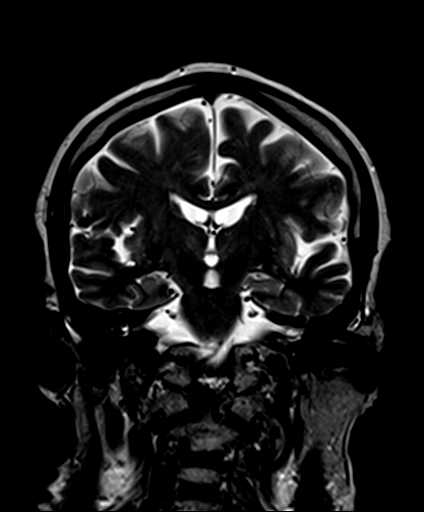
[im 25/25]
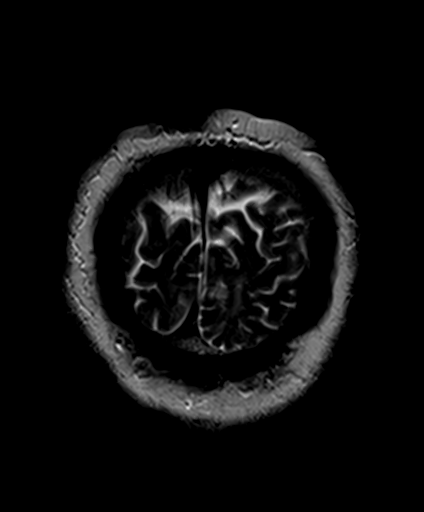

[48 of 48 positions shown; findings below may reference images not displayed]

FINDINGS: No evidence for acute infarction, hemorrhage, mass lesion,
hydrocephalus, or extra-axial fluid. Global atrophy. Mild to
moderate subcortical and periventricular T2 and FLAIR
hyperintensities, likely chronic microvascular ischemic change.

Pituitary, pineal, and cerebellar tonsils unremarkable. No upper
cervical lesions.

Flow voids are maintained throughout the carotid, basilar, and
vertebral arteries. There are no areas of chronic hemorrhage.

Visualized calvarium, skull base, and upper cervical osseous
structures unremarkable. Scalp and extracranial soft tissues,
orbits, sinuses, and mastoids show no acute process.
IMPRESSION: Atrophy and small vessel disease.  No acute intracranial findings.

## 2018-01-19 DIAGNOSIS — H401133 Primary open-angle glaucoma, bilateral, severe stage: Secondary | ICD-10-CM | POA: Diagnosis not present

## 2018-01-26 ENCOUNTER — Other Ambulatory Visit: Payer: Self-pay | Admitting: *Deleted

## 2018-01-26 DIAGNOSIS — M542 Cervicalgia: Secondary | ICD-10-CM

## 2018-01-26 NOTE — Telephone Encounter (Signed)
Patient called back and he needed a referral for neck pain.

## 2018-02-08 DIAGNOSIS — M13811 Other specified arthritis, right shoulder: Secondary | ICD-10-CM | POA: Diagnosis not present

## 2018-02-08 DIAGNOSIS — M25511 Pain in right shoulder: Secondary | ICD-10-CM | POA: Diagnosis not present

## 2018-02-24 DIAGNOSIS — C61 Malignant neoplasm of prostate: Secondary | ICD-10-CM | POA: Diagnosis not present

## 2018-03-05 DIAGNOSIS — C61 Malignant neoplasm of prostate: Secondary | ICD-10-CM | POA: Diagnosis not present

## 2018-03-12 ENCOUNTER — Other Ambulatory Visit: Payer: Self-pay | Admitting: Family Medicine

## 2018-04-07 DIAGNOSIS — H401133 Primary open-angle glaucoma, bilateral, severe stage: Secondary | ICD-10-CM | POA: Diagnosis not present

## 2018-05-11 ENCOUNTER — Other Ambulatory Visit: Payer: Self-pay | Admitting: Family Medicine

## 2018-06-08 ENCOUNTER — Other Ambulatory Visit: Payer: Self-pay | Admitting: Family Medicine

## 2018-06-21 ENCOUNTER — Ambulatory Visit: Payer: Medicare Other | Admitting: Family Medicine

## 2018-07-27 DIAGNOSIS — H401133 Primary open-angle glaucoma, bilateral, severe stage: Secondary | ICD-10-CM | POA: Diagnosis not present

## 2018-08-07 ENCOUNTER — Other Ambulatory Visit: Payer: Self-pay | Admitting: Family Medicine

## 2018-08-10 NOTE — Telephone Encounter (Signed)
Pt is already scheduled on 09/06/18 with Dr. Carollee Herter.

## 2018-08-10 NOTE — Telephone Encounter (Signed)
Pt is due for follow up please call and schedule appointment.  

## 2018-08-13 ENCOUNTER — Telehealth: Payer: Self-pay | Admitting: *Deleted

## 2018-08-13 NOTE — Telephone Encounter (Signed)
Copied from [place] [id number]. Topic: Quick Communication - See Telephone Encounter >> Aug 11, 2018  4:49 PM Antonieta Iba C wrote: CRM for notification. See Telephone encounter for: 08/11/18.  Cindy w/[employer] house call program is calling in to provide results to provider.   PAD Screening: Right - 0.77 Left-0.47   Cindy: [phone]

## 2018-08-27 DIAGNOSIS — H401133 Primary open-angle glaucoma, bilateral, severe stage: Secondary | ICD-10-CM | POA: Diagnosis not present

## 2018-09-06 ENCOUNTER — Encounter: Payer: Self-pay | Admitting: Family Medicine

## 2018-09-06 ENCOUNTER — Ambulatory Visit (INDEPENDENT_AMBULATORY_CARE_PROVIDER_SITE_OTHER): Payer: Medicare Other | Admitting: Family Medicine

## 2018-09-06 VITALS — BP 143/59 | HR 56 | Temp 98.1°F | Resp 16 | Ht 64.4 in | Wt 180.0 lb

## 2018-09-06 DIAGNOSIS — I10 Essential (primary) hypertension: Secondary | ICD-10-CM

## 2018-09-06 DIAGNOSIS — Z1211 Encounter for screening for malignant neoplasm of colon: Secondary | ICD-10-CM

## 2018-09-06 DIAGNOSIS — Z0000 Encounter for general adult medical examination without abnormal findings: Secondary | ICD-10-CM

## 2018-09-06 DIAGNOSIS — R972 Elevated prostate specific antigen [PSA]: Secondary | ICD-10-CM

## 2018-09-06 DIAGNOSIS — E785 Hyperlipidemia, unspecified: Secondary | ICD-10-CM

## 2018-09-06 LAB — POC HEMOCCULT BLD/STL (OFFICE/1-CARD/DIAGNOSTIC)
FECAL OCCULT BLD: NEGATIVE
OCCULT BLOOD DATE: 9302019

## 2018-09-06 NOTE — Assessment & Plan Note (Signed)
ghm utd Check labs See AVS 

## 2018-09-06 NOTE — Progress Notes (Signed)
Patient ID: [name] Gibson, male    DOB: 1941-10-22  Age: 77 y.o. MRN: [id number]    Subjective:  Subjective  HPI Macky [name] presents for cpe and labs No complaints   Review of Systems  Constitutional: Negative.  Negative for chills and fever.  HENT: Negative for congestion, ear pain, hearing loss, nosebleeds, postnasal drip, rhinorrhea, sinus pressure, sneezing and tinnitus.   Eyes: Negative for photophobia, discharge, itching and visual disturbance.  Respiratory: Negative.  Negative for cough and shortness of breath.   Cardiovascular: Negative.  Negative for chest pain, palpitations and leg swelling.  Gastrointestinal: Negative for abdominal distention, abdominal pain, anal bleeding, blood in stool, constipation, diarrhea, nausea and vomiting.  Endocrine: Negative.   Genitourinary: Negative.  Negative for dysuria, frequency, hematuria and urgency.  Musculoskeletal: Negative.  Negative for back pain and myalgias.  Skin: Negative.  Negative for rash.  Allergic/Immunologic: Negative.  Negative for environmental allergies.  Neurological: Negative for dizziness, weakness, light-headedness, numbness and headaches.  Hematological: Does not bruise/bleed easily.  Psychiatric/Behavioral: Negative for agitation, confusion, decreased concentration, dysphoric mood, sleep disturbance and suicidal ideas. The patient is not nervous/anxious.     History Past Medical History:  Diagnosis Date  . ANGIOEDEMA 08/25/2007   Qualifier: Diagnosis of  By: Jerold Coombe    . ANKLE PAIN, RIGHT 11/16/2008   Qualifier: Diagnosis of  By: Jerold Coombe    . Cancer [facility])    prostate  . COLONIC POLYPS 10/22/2007   Qualifier: Diagnosis of  By: Jerold Coombe    . DIVERTICULOSIS OF COLON 10/22/2007   Qualifier: Diagnosis of  By: Jerold Coombe    . GLAUCOMA 03/23/2007   Qualifier: Diagnosis of  By: Jerold Coombe    . Glaucoma   . GLUCOSE INTOLERANCE, HX OF 03/23/2007   Qualifier: Diagnosis of  By:  Jerold Coombe    . Hyperlipidemia   . HYPERLIPIDEMIA 03/23/2007   Qualifier: Diagnosis of  By: Jerold Coombe    . Hypertension   . HYPERTENSION 09/08/2007   Qualifier: Diagnosis of  By: Jerold Coombe    . HYPERTHYROIDISM 03/23/2007   Qualifier: Diagnosis of  By: Jerold Coombe  - no med  . Obesity (BMI 30-39.9) 12/07/2013  . PROSTATE CANCER, HX OF 10/02/2010   Qualifier: Diagnosis of  By: Jerold Coombe    . PSA, INCREASED 08/08/2009   Qualifier: Diagnosis of  By: Jerold Coombe    . Right shoulder pain 02/08/2015  . Sebaceous cyst 12/13/2008   Qualifier: Diagnosis of  By: Jerold Coombe    . SINUSITIS- ACUTE-NOS 09/12/2008   Qualifier: Diagnosis of  By: Jerold Coombe    . URI 12/13/2008   Qualifier: Diagnosis of  By: Jerold Coombe      He has a past surgical history that includes Appendectomy and radioactive seed implation.   His family history includes Brain cancer in his brother; Breast cancer in his sister; Cancer in his father; Colon cancer in his maternal grandmother; Coronary artery disease in his other; Diabetes in his other; Diabetes Mellitus II in his brother; Hyperlipidemia in his other; Hypertension in his other; Kidney failure in his brother; Lung cancer in his brother; Stomach cancer in his sister; Stroke in his brother, brother, and sister; Throat cancer in his brother.He reports that he has never smoked. He has never used smokeless tobacco. He reports that he drinks about 1.0 standard drinks of alcohol per week. He reports that he  does not use drugs.  Current Outpatient Medications on File Prior to Visit  Medication Sig Dispense Refill  . amLODipine (NORVASC) 10 MG tablet Take 1 tablet (10 mg total) by mouth daily. 90 tablet 1  . amLODipine (NORVASC) 10 MG tablet TAKE 1 TABLET BY MOUTH DAILY 90 tablet 0  . atorvastatin (LIPITOR) 80 MG tablet Take 1 tablet (80 mg total) by mouth daily. 90 tablet 3  . fluticasone (FLONASE) 50 MCG/ACT nasal spray Place 2 sprays  into both nostrils daily. 16 g 6  . hydrochlorothiazide (MICROZIDE) 12.5 MG capsule TAKE 1 CAPSULE BY MOUTH DAILY 90 capsule 0  . levocetirizine (XYZAL) 5 MG tablet Take 1 tablet (5 mg total) by mouth every evening. 30 tablet 5  . LUMIGAN 0.01 % SOLN Place 1 drop into both eyes at bedtime.   4  . metoprolol (TOPROL-XL) 200 MG 24 hr tablet TAKE 1 TABLET BY MOUTH DAILY 90 tablet 0   Current Facility-Administered Medications on File Prior to Visit  Medication Dose Route Frequency Provider Last Rate Last Dose  . 0.9 %  sodium chloride infusion  500 mL Intravenous Continuous Ladene Artist, MD         Objective:  Objective  Physical Exam  Constitutional: He is oriented to person, place, and time. He appears well-developed and well-nourished. No distress.  HENT:  Head: Normocephalic and atraumatic.  Right Ear: External ear normal.  Left Ear: External ear normal.  Nose: Nose normal.  Mouth/Throat: Oropharynx is clear and moist. No oropharyngeal exudate.  Eyes: Pupils are equal, round, and reactive to light. Conjunctivae and EOM are normal. Right eye exhibits no discharge. Left eye exhibits no discharge.  Neck: Normal range of motion. Neck supple. No JVD present. No thyromegaly present.  Cardiovascular: Normal rate, regular rhythm and intact distal pulses.  No murmur heard. Pulmonary/Chest: Effort normal and breath sounds normal. No respiratory distress. He has no wheezes. He has no rales. He exhibits no tenderness.  Abdominal: Soft. Bowel sounds are normal. He exhibits no distension and no mass. There is no tenderness. There is no rebound and no guarding.  Genitourinary: Prostate normal and penis normal. Rectal exam shows guaiac negative stool. No penile tenderness.  Musculoskeletal: Normal range of motion. He exhibits no edema or tenderness.  Lymphadenopathy:    He has no cervical adenopathy.  Neurological: He is alert and oriented to person, place, and time. He has normal reflexes. He  displays normal reflexes. No cranial nerve deficit. He exhibits normal muscle tone.  Skin: Skin is warm and dry. No rash noted. He is not diaphoretic. No erythema.  Psychiatric: He has a normal mood and affect. His behavior is normal. Judgment and thought content normal.  Nursing note and vitals reviewed.  BP (!) 143/59   Pulse (!) 56  Wt Readings from Last 3 Encounters:  11/09/17 178 lb (80.7 kg)  08/25/17 175 lb 12.8 oz (79.7 kg)  10/22/16 178 lb (80.7 kg)     Lab Results  Component Value Date   WBC 5.6 11/09/2017   HGB 15.5 11/09/2017   HCT 46.0 11/09/2017   PLT 208.0 11/09/2017   GLUCOSE 98 11/09/2017   CHOL 155 11/09/2017   TRIG 53.0 11/09/2017   HDL 53.10 11/09/2017   LDLDIRECT 173.4 03/16/2013   LDLCALC 91 11/09/2017   ALT 43 11/09/2017   AST 29 11/09/2017   NA 138 11/09/2017   K 5.1 11/09/2017   CL 100 11/09/2017   CREATININE 1.06 11/09/2017   BUN  19 11/09/2017   CO2 31 11/09/2017   TSH 2.28 12/14/2014   PSA 0.16 12/14/2014   INR 1.03 12/12/2009   HGBA1C 6.0 10/03/2016   MICROALBUR 0.6 12/14/2014    Dg Chest 2 View  Result Date: 11/09/2017 CLINICAL DATA:  Cough off and on for 2 months, RIGHT clavicular and shoulder pain off and on for months, history hypertension, hyperlipidemia, prostate cancer EXAM: CHEST  2 VIEW COMPARISON:  11/14/2009 FINDINGS: Normal heart size and pulmonary vascularity. Elongation of thoracic aorta. Peribronchial thickening and accentuation of perihilar markings question bronchitis. No acute infiltrate, pleural effusion or pneumothorax. Osseous structures unremarkable. IMPRESSION: Probable bronchitic changes without infiltrate. Electronically Signed   By: Lavonia Dana M.D.   On: 11/09/2017 12:59   Dg Clavicle Right  Result Date: 11/09/2017 CLINICAL DATA:  RIGHT clavicular and shoulder pain more medially off and on for months EXAM: RIGHT CLAVICLE - 2+ VIEWS COMPARISON:  None FINDINGS: Osseous mineralization normal. Mild degenerative changes  at RIGHT AC joint. Sternoclavicular joint alignment grossly normal. No acute fracture, dislocation, or bone destruction. Visualized RIGHT ribs unremarkable. IMPRESSION: Mild RIGHT AC joint degenerative changes. No acute bony abnormalities. Electronically Signed   By: Lavonia Dana M.D.   On: 11/09/2017 13:00   US Soft Tissue Head/neck  Result Date: 11/09/2017 CLINICAL DATA:  Palpable mass at the location of the medial right clavicle EXAM: ULTRASOUND OF HEAD/NECK SOFT TISSUES TECHNIQUE: Ultrasound examination of the head and neck soft tissues was performed in the area of clinical concern. COMPARISON:  None. FINDINGS: There is no underlying soft tissue mass at the location of the palpable abnormality. IMPRESSION: No soft tissue mass is visualized by sonography at the palpable abnormality. Electronically Signed   By: Marybelle Killings M.D.   On: 11/09/2017 13:31     Assessment & Plan:  Plan  I have discontinued Rishik [name]'s benzonatate. I am also having him maintain his LUMIGAN, amLODipine, atorvastatin, levocetirizine, fluticasone, metoprolol, hydrochlorothiazide, and amLODipine. We will continue to administer sodium chloride.  No orders of the defined types were placed in this encounter.   Problem List Items Addressed This Visit      Unprioritized   Essential hypertension    Well controlled, no changes to meds. Encouraged heart healthy diet such as the DASH diet and exercise as tolerated.       Relevant Orders   CBC with Differential/Platelet   Comprehensive metabolic panel   Lipid panel   PSA   Hyperlipidemia    Tolerating statin, encouraged heart healthy diet, avoid trans fats, minimize simple carbs and saturated fats. Increase exercise as tolerated      Relevant Orders   CBC with Differential/Platelet   Comprehensive metabolic panel   Lipid panel   PSA   Preventative health care - Primary    ghm utd Check labs  See AVS       Other Visit Diagnoses    Increased prostate specific  antigen (PSA) velocity       Relevant Orders   PSA   Colon cancer screening       Relevant Orders   POC Hemoccult Bld/Stl (1-Cd Office Dx) (Completed)      Follow-up: Return in about 6 months (around 03/07/2019), or if symptoms worsen or fail to improve, for hypertension, hyperlipidemia.  [name], DO

## 2018-09-06 NOTE — Assessment & Plan Note (Signed)
Well controlled, no changes to meds. Encouraged heart healthy diet such as the DASH diet and exercise as tolerated.  °

## 2018-09-06 NOTE — Patient Instructions (Signed)
Preventive Care 79 Years and Older, Male Preventive care refers to lifestyle choices and visits with your health care provider that can promote health and wellness. What does preventive care include?  A yearly physical exam. This is also called an annual well check.  Dental exams once or twice a year.  Routine eye exams. Ask your health care provider how often you should have your eyes checked.  Personal lifestyle choices, including: ? Daily care of your teeth and gums. ? Regular physical activity. ? Eating a healthy diet. ? Avoiding tobacco and drug use. ? Limiting alcohol use. ? Practicing safe sex. ? Taking low doses of aspirin every day. ? Taking vitamin and mineral supplements as recommended by your health care provider. What happens during an annual well check? The services and screenings done by your health care provider during your annual well check will depend on your age, overall health, lifestyle risk factors, and family history of disease. Counseling Your health care provider may ask you questions about your:  Alcohol use.  Tobacco use.  Drug use.  Emotional well-being.  Home and relationship well-being.  Sexual activity.  Eating habits.  History of falls.  Memory and ability to understand (cognition).  Work and work environment.  Screening You may have the following tests or measurements:  Height, weight, and BMI.  Blood pressure.  Lipid and cholesterol levels. These may be checked every 5 years, or more frequently if you are over 50 years old.  Skin check.  Lung cancer screening. You may have this screening every year starting at age 55 if you have a 30-pack-year history of smoking and currently smoke or have quit within the past 15 years.  Fecal occult blood test (FOBT) of the stool. You may have this test every year starting at age 50.  Flexible sigmoidoscopy or colonoscopy. You may have a sigmoidoscopy every 5 years or a colonoscopy every 10  years starting at age 50.  Prostate cancer screening. Recommendations will vary depending on your family history and other risks.  Hepatitis C blood test.  Hepatitis B blood test.  Sexually transmitted disease (STD) testing.  Diabetes screening. This is done by checking your blood sugar (glucose) after you have not eaten for a while (fasting). You may have this done every 1-3 years.  Abdominal aortic aneurysm (AAA) screening. You may need this if you are a current or former smoker.  Osteoporosis. You may be screened starting at age 70 if you are at high risk.  Talk with your health care provider about your test results, treatment options, and if necessary, the need for more tests. Vaccines Your health care provider may recommend certain vaccines, such as:  Influenza vaccine. This is recommended every year.  Tetanus, diphtheria, and acellular pertussis (Tdap, Td) vaccine. You may need a Td booster every 10 years.  Varicella vaccine. You may need this if you have not been vaccinated.  Zoster vaccine. You may need this after age 60.  Measles, mumps, and rubella (MMR) vaccine. You may need at least one dose of MMR if you were born in 1957 or later. You may also need a second dose.  Pneumococcal 13-valent conjugate (PCV13) vaccine. One dose is recommended after age 65.  Pneumococcal polysaccharide (PPSV23) vaccine. One dose is recommended after age 65.  Meningococcal vaccine. You may need this if you have certain conditions.  Hepatitis A vaccine. You may need this if you have certain conditions or if you travel or work in places where you   may be exposed to hepatitis A.  Hepatitis B vaccine. You may need this if you have certain conditions or if you travel or work in places where you may be exposed to hepatitis B.  Haemophilus influenzae type b (Hib) vaccine. You may need this if you have certain risk factors.  Talk to your health care provider about which screenings and vaccines  you need and how often you need them. This information is not intended to replace advice given to you by your health care provider. Make sure you discuss any questions you have with your health care provider. Document Released: 12/21/2015 Document Revised: 08/13/2016 Document Reviewed: 09/25/2015 Elsevier Interactive Patient Education  2018 Elsevier Inc.  

## 2018-09-06 NOTE — Assessment & Plan Note (Signed)
Tolerating statin, encouraged heart healthy diet, avoid trans fats, minimize simple carbs and saturated fats. Increase exercise as tolerated 

## 2018-09-07 LAB — CBC WITH DIFFERENTIAL/PLATELET
BASOS ABS: 0.1 10*3/uL (ref 0.0–0.1)
BASOS PCT: 1.4 % (ref 0.0–3.0)
Eosinophils Absolute: 0.1 10*3/uL (ref 0.0–0.7)
Eosinophils Relative: 1.2 % (ref 0.0–5.0)
HCT: 46.1 % (ref 39.0–52.0)
HEMOGLOBIN: 15.5 g/dL (ref 13.0–17.0)
Lymphocytes Relative: 39.7 % (ref 12.0–46.0)
Lymphs Abs: 2 10*3/uL (ref 0.7–4.0)
MCHC: 33.6 g/dL (ref 30.0–36.0)
MCV: 95.8 fl (ref 78.0–100.0)
MONO ABS: 0.8 10*3/uL (ref 0.1–1.0)
Monocytes Relative: 17.1 % — ABNORMAL HIGH (ref 3.0–12.0)
Neutro Abs: 2 10*3/uL (ref 1.4–7.7)
Neutrophils Relative %: 40.6 % — ABNORMAL LOW (ref 43.0–77.0)
PLATELETS: 187 10*3/uL (ref 150.0–400.0)
RBC: 4.81 Mil/uL (ref 4.22–5.81)
RDW: 14.6 % (ref 11.5–15.5)
WBC: 4.9 10*3/uL (ref 4.0–10.5)

## 2018-09-07 LAB — COMPREHENSIVE METABOLIC PANEL
ALBUMIN: 4.5 g/dL (ref 3.5–5.2)
ALT: 35 U/L (ref 0–53)
AST: 32 U/L (ref 0–37)
Alkaline Phosphatase: 44 U/L (ref 39–117)
BUN: 18 mg/dL (ref 6–23)
CHLORIDE: 101 meq/L (ref 96–112)
CO2: 29 meq/L (ref 19–32)
CREATININE: 0.97 mg/dL (ref 0.40–1.50)
Calcium: 10.1 mg/dL (ref 8.4–10.5)
GFR: 95.96 mL/min (ref >60.00)
Glucose, Bld: 81 mg/dL (ref 70–99)
Potassium: 4.3 mEq/L (ref 3.5–5.1)
SODIUM: 139 meq/L (ref 135–145)
Total Bilirubin: 0.6 mg/dL (ref 0.2–1.2)
Total Protein: 7.4 g/dL (ref 6.0–8.3)

## 2018-09-07 LAB — PSA: PSA: 0.03 ng/mL — AB (ref 0.10–4.00)

## 2018-09-07 LAB — LIPID PANEL
CHOLESTEROL: 195 mg/dL (ref 0–200)
HDL: 58.6 mg/dL (ref >39.00)
LDL Cholesterol: 122 mg/dL — ABNORMAL HIGH (ref 0–99)
NonHDL: 136.36
TRIGLYCERIDES: 72 mg/dL (ref 0.0–149.0)
Total CHOL/HDL Ratio: 3
VLDL: 14.4 mg/dL (ref 0.0–40.0)

## 2018-09-08 ENCOUNTER — Other Ambulatory Visit: Payer: Self-pay | Admitting: Family Medicine

## 2018-09-09 NOTE — Addendum Note (Signed)
Addended byDamita Dunnings D on: 09/09/2018 02:02 PM   Modules accepted: Orders

## 2018-09-10 ENCOUNTER — Encounter (HOSPITAL_COMMUNITY): Payer: Self-pay | Admitting: Emergency Medicine

## 2018-09-10 ENCOUNTER — Emergency Department (HOSPITAL_COMMUNITY): Payer: Medicare Other

## 2018-09-10 ENCOUNTER — Other Ambulatory Visit: Payer: Self-pay

## 2018-09-10 ENCOUNTER — Emergency Department (HOSPITAL_COMMUNITY)
Admission: EM | Admit: 2018-09-10 | Discharge: 2018-09-10 | Disposition: A | Discharge status: Home / Self Care | Payer: Medicare Other | Attending: Emergency Medicine | Admitting: Emergency Medicine

## 2018-09-10 DIAGNOSIS — S60512A Abrasion of left hand, initial encounter: Secondary | ICD-10-CM | POA: Insufficient documentation

## 2018-09-10 DIAGNOSIS — S0083XA Contusion of other part of head, initial encounter: Secondary | ICD-10-CM | POA: Diagnosis not present

## 2018-09-10 DIAGNOSIS — S01111A Laceration without foreign body of right eyelid and periocular area, initial encounter: Secondary | ICD-10-CM | POA: Diagnosis not present

## 2018-09-10 DIAGNOSIS — Y92009 Unspecified place in unspecified non-institutional (private) residence as the place of occurrence of the external cause: Secondary | ICD-10-CM | POA: Insufficient documentation

## 2018-09-10 DIAGNOSIS — T07XXXA Unspecified multiple injuries, initial encounter: Secondary | ICD-10-CM | POA: Diagnosis present

## 2018-09-10 DIAGNOSIS — S80211A Abrasion, right knee, initial encounter: Secondary | ICD-10-CM | POA: Insufficient documentation

## 2018-09-10 DIAGNOSIS — S0993XA Unspecified injury of face, initial encounter: Secondary | ICD-10-CM

## 2018-09-10 DIAGNOSIS — W07XXXA Fall from chair, initial encounter: Secondary | ICD-10-CM | POA: Insufficient documentation

## 2018-09-10 DIAGNOSIS — Y999 Unspecified external cause status: Secondary | ICD-10-CM | POA: Diagnosis not present

## 2018-09-10 DIAGNOSIS — R51 Headache: Secondary | ICD-10-CM | POA: Diagnosis not present

## 2018-09-10 DIAGNOSIS — S0990XA Unspecified injury of head, initial encounter: Secondary | ICD-10-CM | POA: Diagnosis not present

## 2018-09-10 DIAGNOSIS — Y93E9 Activity, other interior property and clothing maintenance: Secondary | ICD-10-CM | POA: Insufficient documentation

## 2018-09-10 DIAGNOSIS — S0093XA Contusion of unspecified part of head, initial encounter: Secondary | ICD-10-CM | POA: Diagnosis not present

## 2018-09-10 DIAGNOSIS — S81011A Laceration without foreign body, right knee, initial encounter: Secondary | ICD-10-CM | POA: Diagnosis not present

## 2018-09-10 DIAGNOSIS — S0181XA Laceration without foreign body of other part of head, initial encounter: Secondary | ICD-10-CM | POA: Diagnosis not present

## 2018-09-10 DIAGNOSIS — T148XXA Other injury of unspecified body region, initial encounter: Secondary | ICD-10-CM

## 2018-09-10 NOTE — ED Provider Notes (Signed)
[facility] Emergency Department Provider Note MRN:  [id number]  Arrival date & time: 09/10/18     Chief Complaint   Eye Pain and Eye Injury   History of Present Illness   [name] Gibson is a 77 y.o. year-old male with a history of hypertension presenting to the ED with chief complaint of facial trauma.  Patient was doing some work around the house, was standing on a chair when he lost balance and fell to the floor.  His head struck the ground, his glasses broke, he sustained a laceration above his right eye, swelling above his right eye.  Also with an abrasion to his left hand, abrasion to his right knee.  Denies vision loss, worsening mild pain around the right eye, mild pain of the right knee, worse with motion.  Denies loss of consciousness.  Takes a daily full dose aspirin.  Review of Systems  A complete 10 system review of systems was obtained and all systems are negative except as noted in the HPI and PMH.   Patient's Health History    Past Medical History:  Diagnosis Date  . ANGIOEDEMA 08/25/2007   Qualifier: Diagnosis of  By: Jerold Coombe    . ANKLE PAIN, RIGHT 11/16/2008   Qualifier: Diagnosis of  By: Jerold Coombe    . Cancer [facility])    prostate  . COLONIC POLYPS 10/22/2007   Qualifier: Diagnosis of  By: Jerold Coombe    . DIVERTICULOSIS OF COLON 10/22/2007   Qualifier: Diagnosis of  By: Jerold Coombe    . GLAUCOMA 03/23/2007   Qualifier: Diagnosis of  By: Jerold Coombe    . Glaucoma   . GLUCOSE INTOLERANCE, HX OF 03/23/2007   Qualifier: Diagnosis of  By: Jerold Coombe    . Hyperlipidemia   . HYPERLIPIDEMIA 03/23/2007   Qualifier: Diagnosis of  By: Jerold Coombe    . Hypertension   . HYPERTENSION 09/08/2007   Qualifier: Diagnosis of  By: Jerold Coombe    . HYPERTHYROIDISM 03/23/2007   Qualifier: Diagnosis of  By: Jerold Coombe  - no med  . Obesity (BMI 30-39.9) 12/07/2013  . PROSTATE CANCER, HX OF 10/02/2010   Qualifier:  Diagnosis of  By: Jerold Coombe    . PSA, INCREASED 08/08/2009   Qualifier: Diagnosis of  By: Jerold Coombe    . Right shoulder pain 02/08/2015  . Sebaceous cyst 12/13/2008   Qualifier: Diagnosis of  By: Jerold Coombe    . SINUSITIS- ACUTE-NOS 09/12/2008   Qualifier: Diagnosis of  By: Jerold Coombe    . URI 12/13/2008   Qualifier: Diagnosis of  By: Jerold Coombe      Past Surgical History:  Procedure Laterality Date  . APPENDECTOMY    . radioactive seed implation     prostate    Family History  Problem Relation Age of Onset  . Brain cancer Brother   . Diabetes Other   . Hyperlipidemia Other   . Hypertension Other   . Coronary artery disease Other   . Cancer Father        spine  . Stomach cancer Sister   . Kidney failure Brother   . Diabetes Mellitus II Brother   . Throat cancer Brother   . Lung cancer Brother   . Stroke Brother   . Stroke Brother   . Stroke Sister   . Breast cancer Sister   . Colon cancer Maternal Grandmother  Social History   Socioeconomic History  . Marital status: Divorced    Spouse name: Not on file  . Number of children: Not on file  . Years of education: Not on file  . Highest education level: Not on file  Occupational History  . 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 Financial resource strain: Not on file  . Food insecurity:    Worry: Not on file    Inability: Not on file  . Transportation needs:    Medical: Not on file    Non-medical: Not on file  Tobacco Use  . Smoking status: Never Smoker  . Smokeless tobacco: Never Used  Substance and Sexual Activity  . Alcohol use: Yes    Alcohol/week: 1.0 standard drinks    Types: 1 Shots of liquor per week  . Drug use: No  . Sexual activity: Yes    Partners: Female  Lifestyle  . Physical activity:    Days per week: Not on file    Minutes per session: Not on file  . Stress: Not on file  Relationships  . Social connections:    Talks on phone: Not  on file    Gets together: Not on file    Attends religious service: Not on file    Active member of club or organization: Not on file    Attends meetings of clubs or organizations: Not on file    Relationship status: Not on file  . Intimate partner violence:    Fear of current or ex partner: Not on file    Emotionally abused: Not on file    Physically abused: Not on file    Forced sexual activity: Not on file  Other Topics Concern  . Not on file  Social History Narrative   Reg exercise--walking 2-3 x a week     Physical Exam  Vital Signs and Nursing Notes reviewed Vitals:   09/10/18 1709 09/10/18 1952  BP: (!) 143/82 (!) 183/88  Pulse: 64 65  Resp: 18 16  Temp: 98.6 F (37 C)   SpO2: 97% 100%    CONSTITUTIONAL: Well-appearing, NAD NEURO:  Alert and oriented x 3, no focal deficits EYES:  eyes equal and reactive, normal extra ocular movements, normal visual acuity ENT/NECK:  no LAD, no JVD CARDIO: Regular rate, well-perfused, normal S1 and S2 PULM:  CTAB no wheezing or rhonchi GI/GU:  normal bowel sounds, non-distended, non-tender MSK/SPINE:  No gross deformities, no edema, mild tenderness to palpation to the right knee SKIN:  no rash, 2 cm laceration to the upper lateral right eyelid with underlying dense hematoma causing significant periorbital swelling. PSYCH:  Appropriate speech and behavior  Diagnostic and Interventional Summary    Labs Reviewed - No data to display  CT Head Wo Contrast  Final Result    CT Maxillofacial Wo Contrast  Final Result    DG Knee Complete 4 Views Right  Final Result      Medications - No data to display   Procedures Critical Care  ED Course and Medical Decision Making  I have reviewed the triage vital signs and the nursing notes.  Pertinent labs & imaging results that were available during my care of the patient were reviewed by me and considered in my medical decision making (see below for details).  Favoring hematoma  beneath the right eyelid, nothing on exam to suggest retrobulbar hematoma.  Normal neurological exam.  Will obtain CT head and maxillary to evaluate  for more significant source of bleeding.  CTs and x-ray unremarkable.  Advised cold compresses at home.  After the discussed management above, the patient was determined to be safe for discharge.  The patient was in agreement with this plan and all questions regarding their care were answered.  ED return precautions were discussed and the patient will return to the ED with any significant worsening of condition.  Barth Kirks. Sedonia Small, [place] [email or web address]  Final Clinical Impressions(s) / ED Diagnoses     ICD-10-CM   1. Hematoma T14.8XXA   2. Facial injury, initial encounter S09.[place]     ED Discharge Orders    None         Maudie Flakes, MD 09/10/18 2002

## 2018-09-10 NOTE — Discharge Instructions (Addendum)
You were evaluated in the Emergency Department and after careful evaluation, we did not find any emergent condition requiring admission or further testing in the hospital.  Your symptoms today seem to be due to bruising and swelling related to your fall.  Your CT scans and x-rays did not show any broken bones or emergencies.  It is important that you place ice or cold packs to the swollen right eye as much as possible during the next 2 days.  This will help with the swelling.  Please return to the Emergency Department if you experience any worsening of your condition.  We encourage you to follow up with a primary care provider.  Thank you for allowing [place] to be a part of your care.

## 2018-09-10 NOTE — ED Triage Notes (Signed)
Patient here from home with complaints of right eye injury. Reports that he tripped and fell. Right eye swelling, bruising and laceration noted. Denies blood thinners. Denies dizziness, no LOC.

## 2018-09-15 ENCOUNTER — Other Ambulatory Visit: Payer: Self-pay

## 2018-09-15 ENCOUNTER — Ambulatory Visit (INDEPENDENT_AMBULATORY_CARE_PROVIDER_SITE_OTHER): Payer: Medicare Other | Admitting: Family Medicine

## 2018-09-15 ENCOUNTER — Encounter: Payer: Self-pay | Admitting: Family Medicine

## 2018-09-15 VITALS — BP 122/78 | HR 58 | Temp 97.7°F | Ht 65.5 in | Wt 182.1 lb

## 2018-09-15 DIAGNOSIS — Z23 Encounter for immunization: Secondary | ICD-10-CM | POA: Diagnosis not present

## 2018-09-15 DIAGNOSIS — S0083XA Contusion of other part of head, initial encounter: Secondary | ICD-10-CM | POA: Diagnosis not present

## 2018-09-15 DIAGNOSIS — W19XXXA Unspecified fall, initial encounter: Secondary | ICD-10-CM | POA: Diagnosis not present

## 2018-09-15 MED ORDER — METOPROLOL SUCCINATE ER 200 MG PO TB24: 200.0000 mg | ORAL_TABLET | Freq: Every day | ORAL | 0 refills | Status: DC

## 2018-09-15 NOTE — Progress Notes (Signed)
Chief Complaint  Patient presents with  . Fall    right side face pain/swelling    Subjective: Patient is a 77 y.o. male here for f/u fall.  Pt was walking down stairs with some boxes in hand.  He was walking towards his basement and missed the last 2 stairs.  He fell and landed on the right side of his face.  He went to the emergency department on 09/10/2018 and had negative imaging of his face.  He was recommended to use ice and Tylenol.  He did have a cut but did not receive a tetanus vaccination.  He denies any neurologic signs/symptoms, vision changes, or weakness.  His main complaint is swelling.   ROS: MSK: +R sided facial pain  Past Medical History:  Diagnosis Date  . ANGIOEDEMA 08/25/2007   Qualifier: Diagnosis of  By: Jerold Coombe    . ANKLE PAIN, RIGHT 11/16/2008   Qualifier: Diagnosis of  By: Jerold Coombe    . Cancer [facility])    prostate  . COLONIC POLYPS 10/22/2007   Qualifier: Diagnosis of  By: Jerold Coombe    . DIVERTICULOSIS OF COLON 10/22/2007   Qualifier: Diagnosis of  By: Jerold Coombe    . GLAUCOMA 03/23/2007   Qualifier: Diagnosis of  By: Jerold Coombe    . GLUCOSE INTOLERANCE, HX OF 03/23/2007   Qualifier: Diagnosis of  By: Jerold Coombe    . HYPERLIPIDEMIA 03/23/2007   Qualifier: Diagnosis of  By: Jerold Coombe    . HYPERTENSION 09/08/2007   Qualifier: Diagnosis of  By: Jerold Coombe    . HYPERTHYROIDISM 03/23/2007   Qualifier: Diagnosis of  By: Jerold Coombe  - no med  . Obesity (BMI 30-39.9) 12/07/2013  . PROSTATE CANCER, HX OF 10/02/2010   Qualifier: Diagnosis of  By: Jerold Coombe    . Right shoulder pain 02/08/2015  . Sebaceous cyst 12/13/2008   Qualifier: Diagnosis of  By: Jerold Coombe      Objective: BP 122/78 (BP Location: Left Arm, Patient Position: Sitting, Cuff Size: Normal)   Pulse (!) 58   Temp 97.7 F (36.5 C) (Oral)   Ht 5' 5.5" (1.664 m)   Wt 182 lb 2 oz (82.6 kg)   SpO2 98%   BMI 29.85 kg/m  General:  Awake, appears stated age HEENT: EOMi Neuro: DTR's equal and symmetric, no cerebellar signs, gait nml Lungs: No accessory muscle use Skin: See below; hematoma and ecchymosis evident, no fluctuance or drainage, no excessive warmth Psych: Age appropriate judgment and insight, normal affect and mood      Assessment and Plan: Fall, initial encounter  Need for tetanus booster - Plan: Tdap vaccine greater than or equal to 7yo IM  Cont ice.  He has not been using Tylenol, will recommend this.  Okay to massage the hematoma to help with uptake of blood.  I assured the patient that his body will slowly reabsorb this extra blood and it will go down in time.  There is no topical medication that I believe will be helpful for this patient in this scenario.  Triple antibiotic ointment over the cut on his eye twice daily for 7 days. As it has been greater than 5 years since his last tetanus shot and he had an injury like this, we will update his immunization. The patient voiced understanding and agreement to the plan.  Elida, DO 09/15/18  12:08 PM

## 2018-09-15 NOTE — Patient Instructions (Addendum)
OK to take Tylenol 1000 mg (2 extra strength tabs) or 975 mg (3 regular strength tabs) every 6 hours as needed.  Continue with ice.  Neosporin twice daily for 1 week over the cut on your eye.  You can massage the area over your eye to help your body reabsorb the blood.  This can take up to 4-6 more weeks to resolve.  There is nothing to put over the area other than what we have mentioned, your body will absorb it with time.  Let us know if you need anything.

## 2018-09-28 ENCOUNTER — Encounter: Payer: Self-pay | Admitting: Family Medicine

## 2018-09-28 ENCOUNTER — Ambulatory Visit (INDEPENDENT_AMBULATORY_CARE_PROVIDER_SITE_OTHER): Payer: Medicare Other | Admitting: Family Medicine

## 2018-09-28 VITALS — BP 122/76 | HR 64 | Temp 97.9°F | Resp 16 | Ht 65.5 in | Wt 179.2 lb

## 2018-09-28 DIAGNOSIS — S0093XA Contusion of unspecified part of head, initial encounter: Secondary | ICD-10-CM | POA: Diagnosis not present

## 2018-09-28 DIAGNOSIS — M25561 Pain in right knee: Secondary | ICD-10-CM

## 2018-09-28 DIAGNOSIS — E785 Hyperlipidemia, unspecified: Secondary | ICD-10-CM | POA: Diagnosis not present

## 2018-09-28 HISTORY — DX: Pain in right knee: M25.561

## 2018-09-28 MED ORDER — FISH OIL 1360 MG PO CAPS: 1.0000 | ORAL_CAPSULE | Freq: Every day | ORAL | Status: AC

## 2018-09-28 MED ORDER — TRAMADOL HCL 50 MG PO TABS: 50.0000 mg | ORAL_TABLET | Freq: Three times a day (TID) | ORAL | 0 refills | Status: DC | PRN

## 2018-09-28 NOTE — Progress Notes (Signed)
Patient ID: [name] Gibson, male    DOB: 05-08-1941  Age: 77 y.o. MRN: [id number]    Subjective:  Subjective  HPI [name] presents for f/u er for falling and injuring knee.  He was seen in her and ct and xrays done.  He had hematoma over R eye and painful L knee but xrays neg.  This occurred on 10/4 --- pain in knee is no better.  Tylenol is not helping  Review of Systems  Constitutional: Negative for chills and fever.  HENT: Negative for congestion and hearing loss.   Eyes: Negative for discharge.  Respiratory: Negative for cough and shortness of breath.   Cardiovascular: Negative for chest pain, palpitations and leg swelling.  Gastrointestinal: Negative for abdominal pain, blood in stool, constipation, diarrhea, nausea and vomiting.  Genitourinary: Negative for dysuria, frequency, hematuria and urgency.  Musculoskeletal: Positive for joint swelling. Negative for back pain and myalgias.  Skin: Negative for rash.  Allergic/Immunologic: Negative for environmental allergies.  Neurological: Negative for dizziness, weakness and headaches.  Hematological: Does not bruise/bleed easily.  Psychiatric/Behavioral: Negative for suicidal ideas. The patient is not nervous/anxious.     History Past Medical History:  Diagnosis Date  . ANGIOEDEMA 08/25/2007   Qualifier: Diagnosis of  By: Jerold Coombe    . ANKLE PAIN, RIGHT 11/16/2008   Qualifier: Diagnosis of  By: Jerold Coombe    . Cancer [facility])    prostate  . COLONIC POLYPS 10/22/2007   Qualifier: Diagnosis of  By: Jerold Coombe    . DIVERTICULOSIS OF COLON 10/22/2007   Qualifier: Diagnosis of  By: Jerold Coombe    . GLAUCOMA 03/23/2007   Qualifier: Diagnosis of  By: Jerold Coombe    . GLUCOSE INTOLERANCE, HX OF 03/23/2007   Qualifier: Diagnosis of  By: Jerold Coombe    . HYPERLIPIDEMIA 03/23/2007   Qualifier: Diagnosis of  By: Jerold Coombe    . HYPERTENSION 09/08/2007   Qualifier: Diagnosis of  By: Jerold Coombe      . HYPERTHYROIDISM 03/23/2007   Qualifier: Diagnosis of  By: Jerold Coombe  - no med  . Obesity (BMI 30-39.9) 12/07/2013  . PROSTATE CANCER, HX OF 10/02/2010   Qualifier: Diagnosis of  By: Jerold Coombe    . Right shoulder pain 02/08/2015  . Sebaceous cyst 12/13/2008   Qualifier: Diagnosis of  By: Jerold Coombe      He has a past surgical history that includes Appendectomy and radioactive seed implation.   His family history includes Brain cancer in his brother; Breast cancer in his sister; Cancer in his father; Colon cancer in his maternal grandmother; Coronary artery disease in his other; Diabetes in his other; Diabetes Mellitus II in his brother; Hyperlipidemia in his other; Hypertension in his other; Kidney failure in his brother; Lung cancer in his brother; Stomach cancer in his sister; Stroke in his brother, brother, and sister; Throat cancer in his brother.He reports that he has never smoked. He has never used smokeless tobacco. He reports that he drinks about 1.0 standard drinks of alcohol per week. He reports that he does not use drugs.  Current Outpatient Medications on File Prior to Visit  Medication Sig Dispense Refill  . amLODipine (NORVASC) 10 MG tablet Take 1 tablet (10 mg total) by mouth daily. 90 tablet 1  . aspirin EC 325 MG tablet Take 325 mg by mouth every other day.    . fluticasone (FLONASE) 50 MCG/ACT nasal spray  Place 2 sprays into both nostrils daily. (Patient taking differently: Place 2 sprays into both nostrils daily as needed for allergies. ) 16 g 6  . hydrochlorothiazide (MICROZIDE) 12.5 MG capsule Take 1 capsule (12.5 mg total) by mouth daily. 90 capsule 1  . levocetirizine (XYZAL) 5 MG tablet Take 1 tablet (5 mg total) by mouth every evening. 30 tablet 5  . LUMIGAN 0.01 % SOLN Place 1 drop into both eyes at bedtime.   4  . metoprolol (TOPROL-XL) 200 MG 24 hr tablet Take 1 tablet (200 mg total) by mouth daily. 90 tablet 0  . RHOPRESSA 0.02 % SOLN Place 1 drop  into both eyes daily.      No current facility-administered medications on file prior to visit.      Objective:  Objective  Physical Exam  Constitutional: He is oriented to person, place, and time. Vital signs are normal. He appears well-developed and well-nourished. He is sleeping.  HENT:  Head: Normocephalic and atraumatic.    Mouth/Throat: Oropharynx is clear and moist.  Eyes: Pupils are equal, round, and reactive to light. EOM and lids are normal.  Neck: Normal range of motion. Neck supple. No thyromegaly present.  Cardiovascular: Normal rate and regular rhythm.  No murmur heard. Pulmonary/Chest: Effort normal and breath sounds normal. No respiratory distress. He has no wheezes. He has no rales. He exhibits no tenderness.  Musculoskeletal: He exhibits no edema.       Left knee: He exhibits decreased range of motion, swelling and ecchymosis. Tenderness found. Patellar tendon tenderness noted.  Neurological: He is alert and oriented to person, place, and time.  Skin: Skin is warm and dry.  Psychiatric: He has a normal mood and affect. His behavior is normal. Judgment and thought content normal.  Nursing note and vitals reviewed.  BP 122/76 (BP Location: Left Arm, Cuff Size: Normal)   Pulse 64   Temp 97.9 F (36.6 C) (Oral)   Resp 16   Ht 5' 5.5" (1.664 m)   Wt 179 lb 3.2 oz (81.3 kg)   SpO2 96%   BMI 29.37 kg/m  Wt Readings from Last 3 Encounters:  09/28/18 179 lb 3.2 oz (81.3 kg)  09/15/18 182 lb 2 oz (82.6 kg)  09/10/18 179 lb 14.3 oz (81.6 kg)     Lab Results  Component Value Date   WBC 4.9 09/06/2018   HGB 15.5 09/06/2018   HCT 46.1 09/06/2018   PLT 187.0 09/06/2018   GLUCOSE 81 09/06/2018   CHOL 195 09/06/2018   TRIG 72.0 09/06/2018   HDL 58.60 09/06/2018   LDLDIRECT 173.4 03/16/2013   LDLCALC 122 (H) 09/06/2018   ALT 35 09/06/2018   AST 32 09/06/2018   NA 139 09/06/2018   K 4.3 09/06/2018   CL 101 09/06/2018   CREATININE 0.97 09/06/2018   BUN 18  09/06/2018   CO2 29 09/06/2018   TSH 2.28 12/14/2014   PSA 0.03 (L) 09/06/2018   INR 1.03 12/12/2009   HGBA1C 6.0 10/03/2016   MICROALBUR 0.6 12/14/2014    Ct Head Wo Contrast  Result Date: 09/10/2018 CLINICAL DATA:  Fall, head injury and facial laceration. EXAM: CT HEAD WITHOUT CONTRAST CT MAXILLOFACIAL WITHOUT CONTRAST TECHNIQUE: Multidetector CT imaging of the head and maxillofacial structures were performed using the standard protocol without intravenous contrast. Multiplanar CT image reconstructions of the maxillofacial structures were also generated. COMPARISON:  MRI head February 14, 2016 FINDINGS: CT HEAD FINDINGS BRAIN: No intraparenchymal hemorrhage, mass effect nor midline shift. Mild  disproportionate bifrontal volume loss with ex vacuo dilatation frontal horns, no hydrocephalus. Patchy supratentorial white matter hypodensities within normal range for patient's age, though non-specific are most compatible with chronic small vessel ischemic disease. No acute large vascular territory infarcts. No abnormal extra-axial fluid collections. Basal cisterns are patent. VASCULAR: Moderate calcific atherosclerosis of the carotid siphons. SKULL: No skull fracture. Large RIGHT frontal periorbital scalp hematoma without subcutaneous gas or radiopaque foreign bodies. OTHER: None. CT MAXILLOFACIAL FINDINGS OSSEOUS: No acute facial fracture. The mandible is intact, the condyles are located. No destructive bony lesions. 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 ORBITS: Ocular globes and orbital contents are normal. SINUSES: Paranasal sinuses are well aerated. Intact nasal septum is midline. Mastoid aircells are well aerated. SOFT TISSUES: Large RIGHT frontal to periorbital scalp hematoma without subcutaneous gas or radiopaque foreign bodies. IMPRESSION: CT HEAD: 1. No acute intracranial process. 2. Stable mild parenchymal brain volume loss. CT maxillofacial: 1. No acute facial  fracture. 2. Large RIGHT frontal to periorbital scalp hematoma. No postseptal involvement. Electronically Signed   By: Elon Alas M.D.   On: 09/10/2018 19:40   Dg Knee Complete 4 Views Right  Result Date: 09/10/2018 CLINICAL DATA:  Knee pain and laceration after a fall today. EXAM: RIGHT KNEE - COMPLETE 4+ VIEW COMPARISON:  None. FINDINGS: No evidence of fracture, dislocation, or joint effusion. No evidence of arthropathy or other focal bone abnormality. Soft tissues are unremarkable. IMPRESSION: Negative. Electronically Signed   By: Misty Stanley M.D.   On: 09/10/2018 18:24   Ct Maxillofacial Wo Contrast  Result Date: 09/10/2018 CLINICAL DATA:  Fall, head injury and facial laceration. EXAM: CT HEAD WITHOUT CONTRAST CT MAXILLOFACIAL WITHOUT CONTRAST TECHNIQUE: Multidetector CT imaging of the head and maxillofacial structures were performed using the standard protocol without intravenous contrast. Multiplanar CT image reconstructions of the maxillofacial structures were also generated. COMPARISON:  MRI head February 14, 2016 FINDINGS: CT HEAD FINDINGS BRAIN: No intraparenchymal hemorrhage, mass effect nor midline shift. Mild disproportionate bifrontal volume loss with ex vacuo dilatation frontal horns, no hydrocephalus. Patchy supratentorial white matter hypodensities within normal range for patient's age, though non-specific are most compatible with chronic small vessel ischemic disease. No acute large vascular territory infarcts. No abnormal extra-axial fluid collections. Basal cisterns are patent. VASCULAR: Moderate calcific atherosclerosis of the carotid siphons. SKULL: No skull fracture. Large RIGHT frontal periorbital scalp hematoma without subcutaneous gas or radiopaque foreign bodies. OTHER: None. CT MAXILLOFACIAL FINDINGS OSSEOUS: No acute facial fracture. The mandible is intact, the condyles are located. No destructive bony lesions. Severe C4-5 and C5-6 spondylosis and severe upper cervical facet  arthropathy. ORBITS: Ocular globes and orbital contents are normal. SINUSES: Paranasal sinuses are well aerated. Intact nasal septum is midline. Mastoid aircells are well aerated. SOFT TISSUES: Large RIGHT frontal to periorbital scalp hematoma without subcutaneous gas or radiopaque foreign bodies. IMPRESSION: CT HEAD: 1. No acute intracranial process. 2. Stable mild parenchymal brain volume loss. CT maxillofacial: 1. No acute facial fracture. 2. Large RIGHT frontal to periorbital scalp hematoma. No postseptal involvement. Electronically Signed   By: Elon Alas M.D.   On: 09/10/2018 19:40     Assessment & Plan:  Plan  I have discontinued Jamoni [name]'s atorvastatin. I am also having him start on Fish Oil and traMADol. Additionally, I am having him maintain his LUMIGAN, amLODipine, levocetirizine, fluticasone, hydrochlorothiazide, RHOPRESSA, aspirin EC, and metoprolol.  Meds ordered this encounter  Medications  . Omega-3 Fatty Acids (FISH OIL) 1360 MG  CAPS    Sig: Take 1 capsule (1,360 mg total) by mouth daily.    Dispense:  30 capsule  . traMADol (ULTRAM) 50 MG tablet    Sig: Take 1 tablet (50 mg total) by mouth every 8 (eight) hours as needed.    Dispense:  30 tablet    Refill:  0    Problem List Items Addressed This Visit      Unprioritized   Acute pain of right knee - Primary    [facility] records/ xrays reviewed Refer to ortho  Tramadol for pain Call or rto prn       Relevant Medications   traMADol (ULTRAM) 50 MG tablet   Other Relevant Orders   Ambulatory referral to Orthopedic Surgery   Hyperlipidemia    Pt is not taking lipitor --- secondary to elevated liver enzymes Start fish oil and recheck labs 3 months      Relevant Medications   Omega-3 Fatty Acids (FISH OIL) 1360 MG CAPS   Other Relevant Orders   Lipid panel   Comprehensive metabolic panel   Traumatic hematoma of head    Warm compresses ---  >48 h after injury Ct reviewed -- neg          Follow-up:  Return in about 3 months (around 12/29/2018), or if symptoms worsen or fail to improve, for hyperlipidemia-- labs only.  [name], DO

## 2018-09-28 NOTE — Assessment & Plan Note (Signed)
[facility] records/ xrays reviewed Refer to ortho  Tramadol for pain Call or rto prn

## 2018-09-28 NOTE — Patient Instructions (Signed)

## 2018-09-28 NOTE — Assessment & Plan Note (Signed)
Warm compresses ---  >48 h after injury Ct reviewed -- neg

## 2018-09-28 NOTE — Assessment & Plan Note (Signed)
Pt is not taking lipitor --- secondary to elevated liver enzymes Start fish oil and recheck labs 3 months

## 2018-10-05 DIAGNOSIS — M25561 Pain in right knee: Secondary | ICD-10-CM | POA: Diagnosis present

## 2018-11-06 ENCOUNTER — Other Ambulatory Visit: Payer: Self-pay | Admitting: Family Medicine

## 2018-11-18 DIAGNOSIS — H401133 Primary open-angle glaucoma, bilateral, severe stage: Secondary | ICD-10-CM | POA: Diagnosis not present

## 2018-12-03 ENCOUNTER — Ambulatory Visit: Payer: Self-pay

## 2018-12-03 NOTE — Telephone Encounter (Signed)
Out going call to patient, who complains of coughing and congestion.  Patient states that Wednesday he was coughing up white colored mucus.  Denies fever.  Not able to sleep.  Drinks plenty of water states Patient.  States that he drinks a jug of water per day.  States that the cough is a little better.  Denies Hemoptysis.  Patient states that he has been taking Mucinex,  helped a little.  Denies Cardiac history.  Denies  Lung history , and  PE.  Had a runny nose on Wednesday.  Has not been out of the country.  Patient decided that he will wait until the arrival of Dr.  Etter Sjogren -Cheri Rous will return to office.  Reviewed some care advice with.   Reason for Disposition . Cough  Answer Assessment - Initial Assessment Questions 1. ONSET: "When did the cough begin?"      Tuesday 2. SEVERITY: "How bad is the cough today?"       A little bit better 3. RESPIRATORY DISTRESS: "Describe your breathing."      alright 4. FEVER: "Do you have a fever?" If so, ask: "What is your temperature, how was it measured, and when did it start?"     no 5. HEMOPTYSIS: "Are you coughing up any blood?" If so ask: "How much?" (flecks, streaks, tablespoons, etc.)     no 6. TREATMENT: "What have you done so far to treat the cough?" (e.g., meds, fluids, humidifier)     mucinex  Helped alittle 7. CARDIAC HISTORY: "Do you have any history of heart disease?" (e.g., heart attack, congestive heart failure)      no 8. LUNG HISTORY: "Do you have any history of lung disease?"  (e.g., pulmonary embolus, asthma, emphysema)     no 9. PE RISK FACTORS: "Do you have a history of blood clots?" (or: recent major surgery, recent prolonged travel, bedridden)     no 10. OTHER SYMPTOMS: "Do you have any other symptoms? (e.g., runny nose, wheezing, chest pain)       Running nose Wednesday  11. PREGNANCY: "Is there any chance you are pregnant?" "When was your last menstrual period?"       na 12. TRAVEL: "Have you traveled out of the country in the  last month?" (e.g., travel history, exposures)       na  Protocols used: COUGH - ACUTE NON-PRODUCTIVE-A-AH

## 2018-12-08 ENCOUNTER — Other Ambulatory Visit: Payer: Self-pay | Admitting: Family Medicine

## 2018-12-09 ENCOUNTER — Other Ambulatory Visit: Payer: Self-pay | Admitting: Family Medicine

## 2018-12-09 DIAGNOSIS — R42 Dizziness and giddiness: Secondary | ICD-10-CM

## 2019-01-05 DIAGNOSIS — L818 Other specified disorders of pigmentation: Secondary | ICD-10-CM | POA: Diagnosis not present

## 2019-01-05 DIAGNOSIS — L910 Hypertrophic scar: Secondary | ICD-10-CM | POA: Diagnosis not present

## 2019-02-05 ENCOUNTER — Other Ambulatory Visit: Payer: Self-pay | Admitting: Family Medicine

## 2019-03-07 ENCOUNTER — Other Ambulatory Visit: Payer: Self-pay | Admitting: Family Medicine

## 2019-03-30 ENCOUNTER — Encounter: Payer: Self-pay | Admitting: Family Medicine

## 2019-03-30 ENCOUNTER — Other Ambulatory Visit: Payer: Self-pay

## 2019-03-30 ENCOUNTER — Telehealth: Payer: Self-pay | Admitting: Family Medicine

## 2019-03-30 ENCOUNTER — Ambulatory Visit (INDEPENDENT_AMBULATORY_CARE_PROVIDER_SITE_OTHER): Payer: Medicare Other | Admitting: Family Medicine

## 2019-03-30 VITALS — BP 132/72 | HR 67 | Temp 98.1°F | Resp 12 | Ht 65.5 in | Wt 182.0 lb

## 2019-03-30 DIAGNOSIS — E785 Hyperlipidemia, unspecified: Secondary | ICD-10-CM

## 2019-03-30 DIAGNOSIS — G4489 Other headache syndrome: Secondary | ICD-10-CM

## 2019-03-30 DIAGNOSIS — R6884 Jaw pain: Secondary | ICD-10-CM | POA: Diagnosis not present

## 2019-03-30 LAB — COMPREHENSIVE METABOLIC PANEL
ALT: 27 U/L (ref 0–53)
AST: 25 U/L (ref 0–37)
Albumin: 4.4 g/dL (ref 3.5–5.2)
Alkaline Phosphatase: 46 U/L (ref 39–117)
BUN: 16 mg/dL (ref 6–23)
CO2: 29 mEq/L (ref 19–32)
Calcium: 9.8 mg/dL (ref 8.4–10.5)
Chloride: 99 mEq/L (ref 96–112)
Creatinine, Ser: 0.92 mg/dL (ref 0.40–1.50)
GFR: 95.84 mL/min (ref >60.00)
Glucose, Bld: 86 mg/dL (ref 70–99)
Potassium: 3.7 mEq/L (ref 3.5–5.1)
Sodium: 136 mEq/L (ref 135–145)
Total Bilirubin: 0.5 mg/dL (ref 0.2–1.2)
Total Protein: 7.1 g/dL (ref 6.0–8.3)

## 2019-03-30 LAB — LIPID PANEL
Cholesterol: 232 mg/dL — ABNORMAL HIGH (ref 0–200)
HDL: 67.3 mg/dL (ref >39.00)
LDL Cholesterol: 150 mg/dL — ABNORMAL HIGH (ref 0–99)
NonHDL: 164.36
Total CHOL/HDL Ratio: 3
Triglycerides: 73 mg/dL (ref 0.0–149.0)
VLDL: 14.6 mg/dL (ref 0.0–40.0)

## 2019-03-30 LAB — CBC WITH DIFFERENTIAL/PLATELET
Basophils Absolute: 0.1 10*3/uL (ref 0.0–0.1)
Basophils Relative: 1.4 % (ref 0.0–3.0)
Eosinophils Absolute: 0 10*3/uL (ref 0.0–0.7)
Eosinophils Relative: 1.1 % (ref 0.0–5.0)
HCT: 43.6 % (ref 39.0–52.0)
Hemoglobin: 14.9 g/dL (ref 13.0–17.0)
Lymphocytes Relative: 39 % (ref 12.0–46.0)
Lymphs Abs: 1.7 10*3/uL (ref 0.7–4.0)
MCHC: 34.1 g/dL (ref 30.0–36.0)
MCV: 94.9 fl (ref 78.0–100.0)
Monocytes Absolute: 0.7 10*3/uL (ref 0.1–1.0)
Monocytes Relative: 15.2 % — ABNORMAL HIGH (ref 3.0–12.0)
Neutro Abs: 1.9 10*3/uL (ref 1.4–7.7)
Neutrophils Relative %: 43.3 % (ref 43.0–77.0)
Platelets: 202 10*3/uL (ref 150.0–400.0)
RBC: 4.6 Mil/uL (ref 4.22–5.81)
RDW: 14.6 % (ref 11.5–15.5)
WBC: 4.3 10*3/uL (ref 4.0–10.5)

## 2019-03-30 LAB — SEDIMENTATION RATE: Sed Rate: 14 mm/hr (ref 0–20)

## 2019-03-30 MED ORDER — TIZANIDINE HCL 4 MG PO TABS: 4.0000 mg | ORAL_TABLET | Freq: Four times a day (QID) | ORAL | 0 refills | Status: DC | PRN

## 2019-03-30 NOTE — Patient Instructions (Signed)

## 2019-03-30 NOTE — Progress Notes (Signed)
Patient ID: [name] Gibson, male    DOB: 1941-07-06  Age: 78 y.o. MRN: [id number]    Subjective:  Subjective  HPI [name] Gibson presents for L sided jaw pain ----he is not sure if he grinds his teeth.  He is due to see the dentist   Review of Systems  Constitutional: Negative for appetite change, diaphoresis, fatigue and unexpected weight change.  HENT: Positive for ear pain.   Eyes: Negative for pain, redness and visual disturbance.  Respiratory: Negative for cough, chest tightness, shortness of breath and wheezing.   Cardiovascular: Negative for chest pain, palpitations and leg swelling.  Endocrine: Negative for cold intolerance, heat intolerance, polydipsia, polyphagia and polyuria.  Genitourinary: Negative for difficulty urinating, dysuria and frequency.  Neurological: Positive for headaches. Negative for dizziness, light-headedness and numbness.    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   Qualifier: Diagnosis of  By: Henry Russel PAIN, RIGHT 11/16/2008   Qualifier: Diagnosis of  By: Jerold Coombe     [facility])    prostate   COLONIC POLYPS 10/22/2007   Qualifier: Diagnosis of  By: Jerold Coombe     DIVERTICULOSIS OF COLON 10/22/2007   Qualifier: Diagnosis of  By: Lowne DO, [place] 03/23/2007   Qualifier: Diagnosis of  By: Etter Sjogren DO, Eoghan Belcher     GLUCOSE INTOLERANCE, HX OF 03/23/2007   Qualifier: Diagnosis of  By: Lowne DO, [place] 03/23/2007   Qualifier: Diagnosis of  By: Jerold Coombe     HYPERTENSION 09/08/2007   Qualifier: Diagnosis of  By: Jerold Coombe     HYPERTHYROIDISM 03/23/2007   Qualifier: Diagnosis of  By: Jerold Coombe  - no med   Obesity (BMI 30-39.9) 12/07/2013   PROSTATE CANCER, HX OF 10/02/2010   Qualifier: Diagnosis of  By: Jerold Coombe     Right shoulder pain 02/08/2015   Sebaceous cyst 12/13/2008   Qualifier: Diagnosis of  By: Jerold Coombe      He  has a past surgical history that includes Appendectomy and radioactive seed implation.   His family history includes Brain cancer in his brother; Breast cancer in his sister; Cancer in his father; Colon cancer in his maternal grandmother; Coronary artery disease in an other family member; Diabetes in an other family member; Diabetes Mellitus II in his brother; Hyperlipidemia in an other family member; Hypertension in an other family member; Kidney failure in his brother; Lung cancer in his brother; Stomach cancer in his sister; Stroke in his brother, brother, and sister; Throat cancer in his brother.He reports that he has never smoked. He has never used smokeless tobacco. He reports current alcohol use of about 1.0 standard drinks of alcohol per week. He reports that he does not use drugs.  Current Outpatient Medications on File Prior to Visit  Medication Sig Dispense Refill   amLODipine (NORVASC) 10 MG tablet TAKE 1 TABLET(10 MG) BY MOUTH DAILY 90 tablet 1   aspirin EC 325 MG tablet Take 325 mg by mouth every other day.     fluticasone (FLONASE) 50 MCG/ACT nasal spray Place 2 sprays into both nostrils daily as needed for allergies. 16 g 5   hydrochlorothiazide (MICROZIDE) 12.5 MG capsule TAKE 1 CAPSULE(12.5 MG) BY MOUTH DAILY 90 capsule 1   LUMIGAN 0.01 % SOLN Place 1 drop into both eyes at bedtime.   4   Omega-3  Fatty Acids (FISH OIL) 1360 MG CAPS Take 1 capsule (1,360 mg total) by mouth daily. 30 capsule    RHOPRESSA 0.02 % SOLN Place 1 drop into both eyes daily.      metoprolol (TOPROL-XL) 200 MG 24 hr tablet TAKE 1 TABLET(200 MG) BY MOUTH DAILY 90 tablet 1   No current facility-administered medications on file prior to visit.      Objective:  Objective  Physical Exam Vitals signs and nursing note reviewed.  Constitutional:      General: He is sleeping.     Appearance: He is well-developed.  HENT:     Head: Normocephalic and atraumatic.      Right Ear: Tympanic membrane, ear  canal and external ear normal.     Left Ear: Tympanic membrane, ear canal and external ear normal.     Nose: Nose normal.     Mouth/Throat:     Pharynx: No oropharyngeal exudate or posterior oropharyngeal erythema.  Eyes:     Pupils: Pupils are equal, round, and reactive to light.  Neck:     Musculoskeletal: Normal range of motion and neck supple.     Thyroid: No thyromegaly.  Cardiovascular:     Rate and Rhythm: Normal rate and regular rhythm.     Heart sounds: No murmur.  Pulmonary:     Effort: Pulmonary effort is normal. No respiratory distress.     Breath sounds: Normal breath sounds. No wheezing or rales.  Chest:     Chest wall: No tenderness.  Musculoskeletal:        General: No tenderness.  Skin:    General: Skin is warm and dry.  Neurological:     Mental Status: He is oriented to person, place, and time.  Psychiatric:        Behavior: Behavior normal.        Thought Content: Thought content normal.        Judgment: Judgment normal.    BP 132/72 (BP Location: Left Arm, Cuff Size: Normal)    Pulse 67    Temp 98.1 F (36.7 C) (Oral)    Resp 12    Ht 5' 5.5" (1.664 m)    Wt 182 lb (82.6 kg)    SpO2 100%    BMI 29.83 kg/m  Wt Readings from Last 3 Encounters:  03/30/19 182 lb (82.6 kg)  09/28/18 179 lb 3.2 oz (81.3 kg)  09/15/18 182 lb 2 oz (82.6 kg)     Lab Results  Component Value Date   WBC 4.9 09/06/2018   HGB 15.5 09/06/2018   HCT 46.1 09/06/2018   PLT 187.0 09/06/2018   GLUCOSE 81 09/06/2018   CHOL 195 09/06/2018   TRIG 72.0 09/06/2018   HDL 58.60 09/06/2018   LDLDIRECT 173.4 03/16/2013   LDLCALC 122 (H) 09/06/2018   ALT 35 09/06/2018   AST 32 09/06/2018   NA 139 09/06/2018   K 4.3 09/06/2018   CL 101 09/06/2018   CREATININE 0.97 09/06/2018   BUN 18 09/06/2018   CO2 29 09/06/2018   TSH 2.28 12/14/2014   PSA 0.03 (L) 09/06/2018   INR 1.03 12/12/2009   HGBA1C 6.0 10/03/2016   MICROALBUR 0.6 12/14/2014    Ct Head Wo Contrast  Result Date:  09/10/2018 CLINICAL DATA:  Fall, head injury and facial laceration. EXAM: CT HEAD WITHOUT CONTRAST CT MAXILLOFACIAL WITHOUT CONTRAST TECHNIQUE: Multidetector CT imaging of the head and maxillofacial structures were performed using the standard protocol without intravenous contrast. Multiplanar CT image reconstructions  of the maxillofacial structures were also generated. COMPARISON:  MRI head February 14, 2016 FINDINGS: CT HEAD FINDINGS BRAIN: No intraparenchymal hemorrhage, mass effect nor midline shift. Mild disproportionate bifrontal volume loss with ex vacuo dilatation frontal horns, no hydrocephalus. Patchy supratentorial white matter hypodensities within normal range for patient's age, though non-specific are most compatible with chronic small vessel ischemic disease. No acute large vascular territory infarcts. No abnormal extra-axial fluid collections. Basal cisterns are patent. VASCULAR: Moderate calcific atherosclerosis of the carotid siphons. SKULL: No skull fracture. Large RIGHT frontal periorbital scalp hematoma without subcutaneous gas or radiopaque foreign bodies. OTHER: None. CT MAXILLOFACIAL FINDINGS OSSEOUS: No acute facial fracture. The mandible is intact, the condyles are located. No destructive bony lesions. 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 ORBITS: Ocular globes and orbital contents are normal. SINUSES: Paranasal sinuses are well aerated. Intact nasal septum is midline. Mastoid aircells are well aerated. SOFT TISSUES: Large RIGHT frontal to periorbital scalp hematoma without subcutaneous gas or radiopaque foreign bodies. IMPRESSION: CT HEAD: 1. No acute intracranial process. 2. Stable mild parenchymal brain volume loss. CT maxillofacial: 1. No acute facial fracture. 2. Large RIGHT frontal to periorbital scalp hematoma. No postseptal involvement. Electronically Signed   By: Elon Alas M.D.   On: 09/10/2018 19:40   Dg Knee Complete 4 Views  Right  Result Date: 09/10/2018 CLINICAL DATA:  Knee pain and laceration after a fall today. EXAM: RIGHT KNEE - COMPLETE 4+ VIEW COMPARISON:  None. FINDINGS: No evidence of fracture, dislocation, or joint effusion. No evidence of arthropathy or other focal bone abnormality. Soft tissues are unremarkable. IMPRESSION: Negative. Electronically Signed   By: Misty Stanley M.D.   On: 09/10/2018 18:24   Ct Maxillofacial Wo Contrast  Result Date: 09/10/2018 CLINICAL DATA:  Fall, head injury and facial laceration. EXAM: CT HEAD WITHOUT CONTRAST CT MAXILLOFACIAL WITHOUT CONTRAST TECHNIQUE: Multidetector CT imaging of the head and maxillofacial structures were performed using the standard protocol without intravenous contrast. Multiplanar CT image reconstructions of the maxillofacial structures were also generated. COMPARISON:  MRI head February 14, 2016 FINDINGS: CT HEAD FINDINGS BRAIN: No intraparenchymal hemorrhage, mass effect nor midline shift. Mild disproportionate bifrontal volume loss with ex vacuo dilatation frontal horns, no hydrocephalus. Patchy supratentorial white matter hypodensities within normal range for patient's age, though non-specific are most compatible with chronic small vessel ischemic disease. No acute large vascular territory infarcts. No abnormal extra-axial fluid collections. Basal cisterns are patent. VASCULAR: Moderate calcific atherosclerosis of the carotid siphons. SKULL: No skull fracture. Large RIGHT frontal periorbital scalp hematoma without subcutaneous gas or radiopaque foreign bodies. OTHER: None. CT MAXILLOFACIAL FINDINGS OSSEOUS: No acute facial fracture. The mandible is intact, the condyles are located. No destructive bony lesions. 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 ORBITS: Ocular globes and orbital contents are normal. SINUSES: Paranasal sinuses are well aerated. Intact nasal septum is midline. Mastoid aircells are well aerated. SOFT TISSUES:  Large RIGHT frontal to periorbital scalp hematoma without subcutaneous gas or radiopaque foreign bodies. IMPRESSION: CT HEAD: 1. No acute intracranial process. 2. Stable mild parenchymal brain volume loss. CT maxillofacial: 1. No acute facial fracture. 2. Large RIGHT frontal to periorbital scalp hematoma. No postseptal involvement. Electronically Signed   By: Elon Alas M.D.   On: 09/10/2018 19:40     Assessment & Plan:  Plan  I have discontinued Serge [name]'s levocetirizine and traMADol. I am also having him start on tiZANidine. Additionally, I am having him maintain  his Lumigan, Rhopressa, aspirin EC, Fish Oil, metoprolol, fluticasone, amLODipine, and hydrochlorothiazide.  Meds ordered this encounter  Medications   tiZANidine (ZANAFLEX) 4 MG tablet    Sig: Take 1 tablet (4 mg total) by mouth every 6 (six) hours as needed for muscle spasms.    Dispense:  30 tablet    Refill:  0    Problem List Items Addressed This Visit      Unprioritized   Pain in upper jaw    ? TMJ--- f/u dentist Muscle relaxer prn  rto prn       Relevant Medications   tiZANidine (ZANAFLEX) 4 MG tablet   Other Relevant Orders   Sedimentation rate   CBC with Differential/Platelet    Other Visit Diagnoses    Other headache syndrome    -  Primary   Relevant Medications   tiZANidine (ZANAFLEX) 4 MG tablet   Other Relevant Orders   Sedimentation rate   CBC with Differential/Platelet   Hyperlipidemia LDL goal <100       Relevant Orders   Lipid panel   Comprehensive metabolic panel      Follow-up: Return in about 6 months (around 09/29/2019), or if symptoms worsen or fail to improve, for hypertension, hyperlipidemia, fasting, annual exam.  [name], DO

## 2019-03-30 NOTE — Telephone Encounter (Signed)
LVM for pt to call and schedule appt per provider pt needs to Return in about 6 months (around 09/29/2019).

## 2019-03-30 NOTE — Assessment & Plan Note (Signed)
?   TMJ--- f/u dentist Muscle relaxer prn  rto prn

## 2019-03-31 ENCOUNTER — Other Ambulatory Visit: Payer: Self-pay | Admitting: Family Medicine

## 2019-03-31 DIAGNOSIS — E785 Hyperlipidemia, unspecified: Secondary | ICD-10-CM

## 2019-04-05 ENCOUNTER — Other Ambulatory Visit: Payer: Self-pay | Admitting: *Deleted

## 2019-04-05 DIAGNOSIS — R6884 Jaw pain: Secondary | ICD-10-CM

## 2019-04-05 MED ORDER — ATORVASTATIN CALCIUM 20 MG PO TABS: 20.0000 mg | ORAL_TABLET | Freq: Every day | ORAL | 2 refills | Status: DC

## 2019-04-05 MED ORDER — TIZANIDINE HCL 4 MG PO TABS: 4.0000 mg | ORAL_TABLET | Freq: Four times a day (QID) | ORAL | 0 refills | Status: DC | PRN

## 2019-04-12 ENCOUNTER — Other Ambulatory Visit: Payer: Self-pay | Admitting: *Deleted

## 2019-04-12 DIAGNOSIS — R6884 Jaw pain: Secondary | ICD-10-CM

## 2019-04-12 MED ORDER — TIZANIDINE HCL 4 MG PO TABS: 4.0000 mg | ORAL_TABLET | Freq: Four times a day (QID) | ORAL | 0 refills | Status: DC | PRN

## 2019-05-24 DIAGNOSIS — H401133 Primary open-angle glaucoma, bilateral, severe stage: Secondary | ICD-10-CM | POA: Diagnosis not present

## 2019-05-31 ENCOUNTER — Telehealth: Payer: Self-pay

## 2019-05-31 MED ORDER — METOPROLOL SUCCINATE ER 200 MG PO TB24: ORAL_TABLET | ORAL | 1 refills | Status: DC

## 2019-05-31 NOTE — Telephone Encounter (Signed)
Copied from [place] [id number]. Topic: Quick Communication - Rx Refill/Question >> May 31, 2019 10:52 AM Carolyn Stare wrote: Medication metoprolol (TOPROL-XL) 200 MG 24 hr tablet   pt said he last picked up the Rx in March    Preferred Pharmacy  [facility]   Agent: Please be advised that RX refills may take up to 3 business days. We ask that you follow-up with your pharmacy.

## 2019-05-31 NOTE — Telephone Encounter (Signed)
Refills have been sent.  

## 2019-07-05 ENCOUNTER — Other Ambulatory Visit (INDEPENDENT_AMBULATORY_CARE_PROVIDER_SITE_OTHER): Payer: Medicare Other

## 2019-07-05 ENCOUNTER — Other Ambulatory Visit: Payer: Self-pay

## 2019-07-05 ENCOUNTER — Other Ambulatory Visit: Payer: Self-pay | Admitting: *Deleted

## 2019-07-05 DIAGNOSIS — E785 Hyperlipidemia, unspecified: Secondary | ICD-10-CM | POA: Diagnosis not present

## 2019-07-05 LAB — CBC WITH DIFFERENTIAL/PLATELET
Basophils Absolute: 0.1 10*3/uL (ref 0.0–0.1)
Basophils Relative: 1.1 % (ref 0.0–3.0)
Eosinophils Absolute: 0.1 10*3/uL (ref 0.0–0.7)
Eosinophils Relative: 2 % (ref 0.0–5.0)
HCT: 42.5 % (ref 39.0–52.0)
Hemoglobin: 14.2 g/dL (ref 13.0–17.0)
Lymphocytes Relative: 38.7 % (ref 12.0–46.0)
Lymphs Abs: 2 10*3/uL (ref 0.7–4.0)
MCHC: 33.5 g/dL (ref 30.0–36.0)
MCV: 97.1 fl (ref 78.0–100.0)
Monocytes Absolute: 0.8 10*3/uL (ref 0.1–1.0)
Monocytes Relative: 15.3 % — ABNORMAL HIGH (ref 3.0–12.0)
Neutro Abs: 2.2 10*3/uL (ref 1.4–7.7)
Neutrophils Relative %: 42.9 % — ABNORMAL LOW (ref 43.0–77.0)
Platelets: 174 10*3/uL (ref 150.0–400.0)
RBC: 4.38 Mil/uL (ref 4.22–5.81)
RDW: 14.4 % (ref 11.5–15.5)
WBC: 5.1 10*3/uL (ref 4.0–10.5)

## 2019-07-05 LAB — LIPID PANEL
Cholesterol: 151 mg/dL (ref 0–200)
HDL: 60.8 mg/dL (ref >39.00)
LDL Cholesterol: 77 mg/dL (ref 0–99)
NonHDL: 89.94
Total CHOL/HDL Ratio: 2
Triglycerides: 67 mg/dL (ref 0.0–149.0)
VLDL: 13.4 mg/dL (ref 0.0–40.0)

## 2019-07-05 LAB — COMPREHENSIVE METABOLIC PANEL
ALT: 43 U/L (ref 0–53)
AST: 33 U/L (ref 0–37)
Albumin: 4.6 g/dL (ref 3.5–5.2)
Alkaline Phosphatase: 45 U/L (ref 39–117)
BUN: 22 mg/dL (ref 6–23)
CO2: 29 mEq/L (ref 19–32)
Calcium: 9.6 mg/dL (ref 8.4–10.5)
Chloride: 99 mEq/L (ref 96–112)
Creatinine, Ser: 1 mg/dL (ref 0.40–1.50)
GFR: 86.99 mL/min (ref >60.00)
Glucose, Bld: 99 mg/dL (ref 70–99)
Potassium: 3.7 mEq/L (ref 3.5–5.1)
Sodium: 136 mEq/L (ref 135–145)
Total Bilirubin: 0.4 mg/dL (ref 0.2–1.2)
Total Protein: 7.1 g/dL (ref 6.0–8.3)

## 2019-07-05 MED ORDER — ATORVASTATIN CALCIUM 20 MG PO TABS: 20.0000 mg | ORAL_TABLET | Freq: Every day | ORAL | 1 refills | Status: DC

## 2019-08-04 ENCOUNTER — Other Ambulatory Visit: Payer: Self-pay | Admitting: Family Medicine

## 2019-08-26 ENCOUNTER — Other Ambulatory Visit: Payer: Self-pay | Admitting: Family Medicine

## 2019-09-13 ENCOUNTER — Encounter: Payer: Self-pay | Admitting: Family Medicine

## 2019-09-13 ENCOUNTER — Ambulatory Visit (INDEPENDENT_AMBULATORY_CARE_PROVIDER_SITE_OTHER): Payer: Medicare Other | Admitting: Family Medicine

## 2019-09-13 ENCOUNTER — Other Ambulatory Visit: Payer: Self-pay

## 2019-09-13 VITALS — BP 140/80 | HR 69 | Temp 97.5°F | Resp 18 | Ht 65.5 in | Wt 180.0 lb

## 2019-09-13 DIAGNOSIS — Z0000 Encounter for general adult medical examination without abnormal findings: Secondary | ICD-10-CM

## 2019-09-13 DIAGNOSIS — E785 Hyperlipidemia, unspecified: Secondary | ICD-10-CM | POA: Diagnosis not present

## 2019-09-13 DIAGNOSIS — Z23 Encounter for immunization: Secondary | ICD-10-CM | POA: Diagnosis not present

## 2019-09-13 DIAGNOSIS — I10 Essential (primary) hypertension: Secondary | ICD-10-CM

## 2019-09-13 DIAGNOSIS — Z8546 Personal history of malignant neoplasm of prostate: Secondary | ICD-10-CM | POA: Diagnosis not present

## 2019-09-13 NOTE — Patient Instructions (Signed)
Preventive Care 75 Years and Older, Male Preventive care refers to lifestyle choices and visits with your health care provider that can promote health and wellness. This includes:  A yearly physical exam. This is also called an annual well check.  Regular dental and eye exams.  Immunizations.  Screening for certain conditions.  Healthy lifestyle choices, such as diet and exercise. What can I expect for my preventive care visit? Physical exam Your health care provider will check:  Height and weight. These may be used to calculate body mass index (BMI), which is a measurement that tells if you are at a healthy weight.  Heart rate and blood pressure.  Your skin for abnormal spots. Counseling Your health care provider may ask you questions about:  Alcohol, tobacco, and drug use.  Emotional well-being.  Home and relationship well-being.  Sexual activity.  Eating habits.  History of falls.  Memory and ability to understand (cognition).  Work and work Statistician. What immunizations do I need?  Influenza (flu) vaccine  This is recommended every year. Tetanus, diphtheria, and pertussis (Tdap) vaccine  You may need a Td booster every 10 years. Varicella (chickenpox) vaccine  You may need this vaccine if you have not already been vaccinated. Zoster (shingles) vaccine  You may need this after age 50. Pneumococcal conjugate (PCV13) vaccine  One dose is recommended after age 24. Pneumococcal polysaccharide (PPSV23) vaccine  One dose is recommended after age 33. Measles, mumps, and rubella (MMR) vaccine  You may need at least one dose of MMR if you were born in 1957 or later. You may also need a second dose. Meningococcal conjugate (MenACWY) vaccine  You may need this if you have certain conditions. Hepatitis A vaccine  You may need this if you have certain conditions or if you travel or work in places where you may be exposed to hepatitis A. Hepatitis B vaccine   You may need this if you have certain conditions or if you travel or work in places where you may be exposed to hepatitis B. Haemophilus influenzae type b (Hib) vaccine  You may need this if you have certain conditions. You may receive vaccines as individual doses or as more than one vaccine together in one shot (combination vaccines). Talk with your health care provider about the risks and benefits of combination vaccines. What tests do I need? Blood tests  Lipid and cholesterol levels. These may be checked every 5 years, or more frequently depending on your overall health.  Hepatitis C test.  Hepatitis B test. Screening  Lung cancer screening. You may have this screening every year starting at age 74 if you have a 30-pack-year history of smoking and currently smoke or have quit within the past 15 years.  Colorectal cancer screening. All adults should have this screening starting at age 57 and continuing until age 54. Your health care provider may recommend screening at age 47 if you are at increased risk. You will have tests every 1-10 years, depending on your results and the type of screening test.  Prostate cancer screening. Recommendations will vary depending on your family history and other risks.  Diabetes screening. This is done by checking your blood sugar (glucose) after you have not eaten for a while (fasting). You may have this done every 1-3 years.  Abdominal aortic aneurysm (AAA) screening. You may need this if you are a current or former smoker.  Sexually transmitted disease (STD) testing. Follow these instructions at home: Eating and drinking  Eat  a diet that includes fresh fruits and vegetables, whole grains, lean protein, and low-fat dairy products. Limit your intake of foods with high amounts of sugar, saturated fats, and salt.  Take vitamin and mineral supplements as recommended by your health care provider.  Do not drink alcohol if your health care provider  tells you not to drink.  If you drink alcohol: ? Limit how much you have to 0-2 drinks a day. ? Be aware of how much alcohol is in your drink. In the [place] one drink equals one 12 oz bottle of beer (355 mL), one 5 oz glass of wine (148 mL), or one 1 oz glass of hard liquor (44 mL). Lifestyle  Take daily care of your teeth and gums.  Stay active. Exercise for at least 30 minutes on 5 or more days each week.  Do not use any products that contain nicotine or tobacco, such as cigarettes, e-cigarettes, and chewing tobacco. If you need help quitting, ask your health care provider.  If you are sexually active, practice safe sex. Use a condom or other form of protection to prevent STIs (sexually transmitted infections).  Talk with your health care provider about taking a low-dose aspirin or statin. What's next?  Visit your health care provider once a year for a well check visit.  Ask your health care provider how often you should have your eyes and teeth checked.  Stay up to date on all vaccines. This information is not intended to replace advice given to you by your health care provider. Make sure you discuss any questions you have with your health care provider. Document Released: 12/21/2015 Document Revised: 11/18/2018 Document Reviewed: 11/18/2018 Elsevier Patient Education  2020 [employer]  

## 2019-09-13 NOTE — Progress Notes (Signed)
Patient ID: [name] Gibson, male    DOB: 1941/11/29  Age: 78 y.o. MRN: [id number]    Subjective:  Subjective  HPI [name] Gibson presents for cpe and f/u bp and chol.  No complaints.  Pt sees urology for prostate.    Review of Systems  Constitutional: Negative.  Negative for chills and fever.  HENT: Negative for congestion, ear pain, hearing loss, nosebleeds, postnasal drip, rhinorrhea, sinus pressure, sneezing and tinnitus.   Eyes: Negative for photophobia, discharge, itching and visual disturbance.  Respiratory: Negative.  Negative for cough and shortness of breath.   Cardiovascular: Negative.  Negative for chest pain, palpitations and leg swelling.  Gastrointestinal: Negative for abdominal distention, abdominal pain, anal bleeding, blood in stool, constipation, diarrhea, nausea and vomiting.  Endocrine: Negative.   Genitourinary: Negative.  Negative for dysuria, frequency, hematuria and urgency.  Musculoskeletal: Negative.  Negative for back pain and myalgias.  Skin: Negative.  Negative for rash.  Allergic/Immunologic: Negative.  Negative for environmental allergies.  Neurological: Negative for dizziness, weakness, light-headedness, numbness and headaches.  Hematological: Does not bruise/bleed easily.  Psychiatric/Behavioral: Negative for agitation, confusion, decreased concentration, dysphoric mood, sleep disturbance and suicidal ideas. The patient is not nervous/anxious.     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   Qualifier: Diagnosis of  By: Henry Russel PAIN, RIGHT 11/16/2008   Qualifier: Diagnosis of  By: Jerold Coombe     Cancer [facility])    prostate   COLONIC POLYPS 10/22/2007   Qualifier: Diagnosis of  By: Jerold Coombe     DIVERTICULOSIS OF COLON 10/22/2007   Qualifier: Diagnosis of  By: Lowne DO, [place] 03/23/2007   Qualifier: Diagnosis of  By: Etter Sjogren DO, [place]     GLUCOSE INTOLERANCE, HX OF 03/23/2007   Qualifier: Diagnosis of  By: Lowne DO, [place] 03/23/2007   Qualifier: Diagnosis of  By: Jerold Coombe     HYPERTENSION 09/08/2007   Qualifier: Diagnosis of  By: Jerold Coombe     HYPERTHYROIDISM 03/23/2007   Qualifier: Diagnosis of  By: Jerold Coombe  - no med   Obesity (BMI 30-39.9) 12/07/2013   PROSTATE CANCER, HX OF 10/02/2010   Qualifier: Diagnosis of  By: Jerold Coombe     Right shoulder pain 02/08/2015   Sebaceous cyst 12/13/2008   Qualifier: Diagnosis of  By: Jerold Coombe      He has a past surgical history that includes Appendectomy and radioactive seed implation.   His family history includes Brain cancer in his brother; Breast cancer in his sister; Cancer in his father; Colon cancer in his maternal grandmother; Coronary artery disease in an other family member; Diabetes in an other family member; Diabetes Mellitus II in his brother; Hyperlipidemia in an other family member; Hypertension in an other family member; Kidney failure in his brother; Lung cancer in his brother; Stomach cancer in his sister; Stroke in his brother, brother, and sister; Throat cancer in his brother.He reports that he has never smoked. He has never used smokeless tobacco. He reports current alcohol use of about 1.0 standard drinks of alcohol per week. He reports that he does not use drugs.  Current Outpatient Medications on File Prior to Visit  Medication Sig Dispense Refill   amLODipine (NORVASC) 10 MG tablet TAKE 1 TABLET(10 MG) BY MOUTH DAILY 90 tablet 1   aspirin EC 325 MG tablet Take  325 mg by mouth every other day.     atorvastatin (LIPITOR) 20 MG tablet Take 1 tablet (20 mg total) by mouth daily. 90 tablet 1   fluticasone (FLONASE) 50 MCG/ACT nasal spray Place 2 sprays into both nostrils daily as needed for allergies. 16 g 5   hydrochlorothiazide (MICROZIDE) 12.5 MG capsule TAKE 1 CAPSULE(12.5 MG) BY MOUTH DAILY 90 capsule 1   LUMIGAN 0.01 % SOLN Place 1 drop  into both eyes at bedtime.   4   metoprolol (TOPROL-XL) 200 MG 24 hr tablet TAKE 1 TABLET(200 MG) BY MOUTH DAILY 90 tablet 1   Omega-3 Fatty Acids (FISH OIL) 1360 MG CAPS Take 1 capsule (1,360 mg total) by mouth daily. 30 capsule    RHOPRESSA 0.02 % SOLN Place 1 drop into both eyes daily.      tiZANidine (ZANAFLEX) 4 MG tablet Take 1 tablet (4 mg total) by mouth every 6 (six) hours as needed for muscle spasms. 30 tablet 0   No current facility-administered medications on file prior to visit.      Objective:  Objective  Physical Exam Vitals signs and nursing note reviewed.  Constitutional:      General: He is sleeping. He is not in acute distress.    Appearance: He is well-developed. He is not diaphoretic.  HENT:     Head: Normocephalic and atraumatic.     Right Ear: External ear normal.     Left Ear: External ear normal.     Nose: Nose normal.     Mouth/Throat:     Pharynx: No oropharyngeal exudate.  Eyes:     General:        Right eye: No discharge.        Left eye: No discharge.     Conjunctiva/sclera: Conjunctivae normal.     Pupils: Pupils are equal, round, and reactive to light.  Neck:     Musculoskeletal: Normal range of motion and neck supple.     Thyroid: No thyromegaly.     Vascular: No JVD.  Cardiovascular:     Rate and Rhythm: Normal rate and regular rhythm.     Heart sounds: No murmur. No friction rub. No gallop.   Pulmonary:     Effort: Pulmonary effort is normal. No respiratory distress.     Breath sounds: Normal breath sounds. No wheezing or rales.  Chest:     Chest wall: No tenderness.  Abdominal:     General: Bowel sounds are normal. There is no distension.     Palpations: Abdomen is soft. There is no mass.     Tenderness: There is no abdominal tenderness. There is no guarding or rebound.  Genitourinary:    Comments: urology Musculoskeletal: Normal range of motion.        General: No tenderness.  Lymphadenopathy:     Cervical: No cervical  adenopathy.  Skin:    General: Skin is warm and dry.     Coloration: Skin is not pale.     Findings: No erythema or rash.  Neurological:     Mental Status: He is oriented to person, place, and time.     Motor: No abnormal muscle tone.     Deep Tendon Reflexes: Reflexes are normal and symmetric. Reflexes normal.  Psychiatric:        Behavior: Behavior normal.        Thought Content: Thought content normal.        Judgment: Judgment normal.    BP 140/80 (BP Location:  Right Arm, Patient Position: Sitting, Cuff Size: Normal)    Pulse 69    Temp (!) 97.5 F (36.4 C) (Temporal)    Resp 18    Ht 5' 5.5" (1.664 m)    Wt 180 lb (81.6 kg)    SpO2 96%    BMI 29.50 kg/m  Wt Readings from Last 3 Encounters:  09/13/19 180 lb (81.6 kg)  03/30/19 182 lb (82.6 kg)  09/28/18 179 lb 3.2 oz (81.3 kg)     Lab Results  Component Value Date   WBC 5.1 07/05/2019   HGB 14.2 07/05/2019   HCT 42.5 07/05/2019   PLT 174.0 07/05/2019   GLUCOSE 99 07/05/2019   CHOL 151 07/05/2019   TRIG 67.0 07/05/2019   HDL 60.80 07/05/2019   LDLDIRECT 173.4 03/16/2013   LDLCALC 77 07/05/2019   ALT 43 07/05/2019   AST 33 07/05/2019   NA 136 07/05/2019   K 3.7 07/05/2019   CL 99 07/05/2019   CREATININE 1.00 07/05/2019   BUN 22 07/05/2019   CO2 29 07/05/2019   TSH 2.28 12/14/2014   PSA 0.03 (L) 09/06/2018   INR 1.03 12/12/2009   HGBA1C 6.0 10/03/2016   MICROALBUR 0.6 12/14/2014    Ct Head Wo Contrast  Result Date: 09/10/2018 CLINICAL DATA:  Fall, head injury and facial laceration. EXAM: CT HEAD WITHOUT CONTRAST CT MAXILLOFACIAL WITHOUT CONTRAST TECHNIQUE: Multidetector CT imaging of the head and maxillofacial structures were performed using the standard protocol without intravenous contrast. Multiplanar CT image reconstructions of the maxillofacial structures were also generated. COMPARISON:  MRI head February 14, 2016 FINDINGS: CT HEAD FINDINGS BRAIN: No intraparenchymal hemorrhage, mass effect nor midline shift.  Mild disproportionate bifrontal volume loss with ex vacuo dilatation frontal horns, no hydrocephalus. Patchy supratentorial white matter hypodensities within normal range for patient's age, though non-specific are most compatible with chronic small vessel ischemic disease. No acute large vascular territory infarcts. No abnormal extra-axial fluid collections. Basal cisterns are patent. VASCULAR: Moderate calcific atherosclerosis of the carotid siphons. SKULL: No skull fracture. Large RIGHT frontal periorbital scalp hematoma without subcutaneous gas or radiopaque foreign bodies. OTHER: None. CT MAXILLOFACIAL FINDINGS OSSEOUS: No acute facial fracture. The mandible is intact, the condyles are located. No destructive bony lesions. 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 ORBITS: Ocular globes and orbital contents are normal. SINUSES: Paranasal sinuses are well aerated. Intact nasal septum is midline. Mastoid aircells are well aerated. SOFT TISSUES: Large RIGHT frontal to periorbital scalp hematoma without subcutaneous gas or radiopaque foreign bodies. IMPRESSION: CT HEAD: 1. No acute intracranial process. 2. Stable mild parenchymal brain volume loss. CT maxillofacial: 1. No acute facial fracture. 2. Large RIGHT frontal to periorbital scalp hematoma. No postseptal involvement. Electronically Signed   By: Elon Alas M.D.   On: 09/10/2018 19:40   Dg Knee Complete 4 Views Right  Result Date: 09/10/2018 CLINICAL DATA:  Knee pain and laceration after a fall today. EXAM: RIGHT KNEE - COMPLETE 4+ VIEW COMPARISON:  None. FINDINGS: No evidence of fracture, dislocation, or joint effusion. No evidence of arthropathy or other focal bone abnormality. Soft tissues are unremarkable. IMPRESSION: Negative. Electronically Signed   By: Misty Stanley M.D.   On: 09/10/2018 18:24   Ct Maxillofacial Wo Contrast  Result Date: 09/10/2018 CLINICAL DATA:  Fall, head injury and facial laceration.  EXAM: CT HEAD WITHOUT CONTRAST CT MAXILLOFACIAL WITHOUT CONTRAST TECHNIQUE: Multidetector CT imaging of the head and maxillofacial structures were performed using the standard protocol without  intravenous contrast. Multiplanar CT image reconstructions of the maxillofacial structures were also generated. COMPARISON:  MRI head February 14, 2016 FINDINGS: CT HEAD FINDINGS BRAIN: No intraparenchymal hemorrhage, mass effect nor midline shift. Mild disproportionate bifrontal volume loss with ex vacuo dilatation frontal horns, no hydrocephalus. Patchy supratentorial white matter hypodensities within normal range for patient's age, though non-specific are most compatible with chronic small vessel ischemic disease. No acute large vascular territory infarcts. No abnormal extra-axial fluid collections. Basal cisterns are patent. VASCULAR: Moderate calcific atherosclerosis of the carotid siphons. SKULL: No skull fracture. Large RIGHT frontal periorbital scalp hematoma without subcutaneous gas or radiopaque foreign bodies. OTHER: None. CT MAXILLOFACIAL FINDINGS OSSEOUS: No acute facial fracture. The mandible is intact, the condyles are located. No destructive bony lesions. 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 ORBITS: Ocular globes and orbital contents are normal. SINUSES: Paranasal sinuses are well aerated. Intact nasal septum is midline. Mastoid aircells are well aerated. SOFT TISSUES: Large RIGHT frontal to periorbital scalp hematoma without subcutaneous gas or radiopaque foreign bodies. IMPRESSION: CT HEAD: 1. No acute intracranial process. 2. Stable mild parenchymal brain volume loss. CT maxillofacial: 1. No acute facial fracture. 2. Large RIGHT frontal to periorbital scalp hematoma. No postseptal involvement. Electronically Signed   By: Elon Alas M.D.   On: 09/10/2018 19:40     Assessment & Plan:  Plan  I am having Sie [name] maintain his Lumigan, Rhopressa, aspirin EC,  Fish Oil, fluticasone, tiZANidine, metoprolol, atorvastatin, amLODipine, and hydrochlorothiazide.  No orders of the defined types were placed in this encounter.   Problem List Items Addressed This Visit      Unprioritized   Essential hypertension    Well controlled, no changes to meds. Encouraged heart healthy diet such as the DASH diet and exercise as tolerated.       Relevant Orders   Comprehensive metabolic panel   History of prostate cancer    Per urology      Relevant Orders   PSA   Hyperlipidemia    Tolerating statin, encouraged heart healthy diet, avoid trans fats, minimize simple carbs and saturated fats. Increase exercise as tolerated      Relevant Orders   Lipid panel   Comprehensive metabolic panel   Preventative health care - Primary    ghm utd Check labs See aVS       Other Visit Diagnoses    Need for influenza vaccination       Relevant Orders   Flu Vaccine QUAD High Dose(Fluad) (Completed)      Follow-up: Return in about 6 months (around 03/13/2020), or if symptoms worsen or fail to improve.  [name], DO

## 2019-09-14 LAB — COMPREHENSIVE METABOLIC PANEL
ALT: 26 U/L (ref 0–53)
AST: 25 U/L (ref 0–37)
Albumin: 4.6 g/dL (ref 3.5–5.2)
Alkaline Phosphatase: 45 U/L (ref 39–117)
BUN: 18 mg/dL (ref 6–23)
CO2: 28 mEq/L (ref 19–32)
Calcium: 10.3 mg/dL (ref 8.4–10.5)
Chloride: 100 mEq/L (ref 96–112)
Creatinine, Ser: 1.01 mg/dL (ref 0.40–1.50)
GFR: 85.95 mL/min (ref >60.00)
Glucose, Bld: 95 mg/dL (ref 70–99)
Potassium: 3.7 mEq/L (ref 3.5–5.1)
Sodium: 137 mEq/L (ref 135–145)
Total Bilirubin: 0.5 mg/dL (ref 0.2–1.2)
Total Protein: 7.4 g/dL (ref 6.0–8.3)

## 2019-09-14 LAB — LIPID PANEL
Cholesterol: 179 mg/dL (ref 0–200)
HDL: 60.6 mg/dL (ref >39.00)
LDL Cholesterol: 107 mg/dL — ABNORMAL HIGH (ref 0–99)
NonHDL: 118.17
Total CHOL/HDL Ratio: 3
Triglycerides: 58 mg/dL (ref 0.0–149.0)
VLDL: 11.6 mg/dL (ref 0.0–40.0)

## 2019-09-14 LAB — PSA: PSA: 0.02 ng/mL — ABNORMAL LOW (ref 0.10–4.00)

## 2019-09-14 NOTE — Assessment & Plan Note (Signed)
Well controlled, no changes to meds. Encouraged heart healthy diet such as the DASH diet and exercise as tolerated.  °

## 2019-09-14 NOTE — Assessment & Plan Note (Signed)
Per u rology 

## 2019-09-14 NOTE — Assessment & Plan Note (Signed)
ghm utd ?Check labs  ?See aVS ? ?

## 2019-09-14 NOTE — Assessment & Plan Note (Signed)
Tolerating statin, encouraged heart healthy diet, avoid trans fats, minimize simple carbs and saturated fats. Increase exercise as tolerated 

## 2019-09-27 DIAGNOSIS — H401133 Primary open-angle glaucoma, bilateral, severe stage: Secondary | ICD-10-CM | POA: Diagnosis not present

## 2019-12-07 ENCOUNTER — Telehealth: Payer: Self-pay | Admitting: *Deleted

## 2019-12-07 ENCOUNTER — Other Ambulatory Visit: Payer: Self-pay | Admitting: Family Medicine

## 2019-12-07 DIAGNOSIS — R6884 Jaw pain: Secondary | ICD-10-CM

## 2019-12-07 MED ORDER — TIZANIDINE HCL 4 MG PO TABS: 4.0000 mg | ORAL_TABLET | Freq: Four times a day (QID) | ORAL | 0 refills | Status: DC | PRN

## 2019-12-07 NOTE — Telephone Encounter (Signed)
I sent a refill If he is still having a problem after this we will need to see him

## 2019-12-07 NOTE — Telephone Encounter (Signed)
Received fax from [facility] for refill of Tizanidine 4mg  every 6 hours as needed for muscle spasms.  Last filled 11/23/19, #30.  Please advise?

## 2019-12-10 ENCOUNTER — Other Ambulatory Visit: Payer: Self-pay | Admitting: Family Medicine

## 2019-12-13 ENCOUNTER — Other Ambulatory Visit: Payer: Self-pay | Admitting: Family Medicine

## 2019-12-13 NOTE — Telephone Encounter (Signed)
Medication Refill - Medication: metoprolol (TOPROL-XL) 200 MG 24 hr tablet [id number]    Preferred Pharmacy (with phone number or street name):  [facility] [id number], [address]  [place] [address]  Phone: [phone] Fax: [phone]     Agent: Please be advised that RX refills may take up to 3 business days. We ask that you follow-up with your pharmacy.

## 2019-12-13 NOTE — Telephone Encounter (Signed)
Left detailed message on home # to call for appointment if further medication is needed.

## 2019-12-14 MED ORDER — METOPROLOL SUCCINATE ER 200 MG PO TB24: ORAL_TABLET | ORAL | 1 refills | Status: DC

## 2020-01-10 ENCOUNTER — Telehealth: Payer: Self-pay | Admitting: *Deleted

## 2020-01-10 MED ORDER — ATORVASTATIN CALCIUM 20 MG PO TABS: 20.0000 mg | ORAL_TABLET | Freq: Every day | ORAL | 1 refills | Status: DC

## 2020-02-02 ENCOUNTER — Other Ambulatory Visit: Payer: Self-pay | Admitting: *Deleted

## 2020-02-02 DIAGNOSIS — R42 Dizziness and giddiness: Secondary | ICD-10-CM

## 2020-02-02 MED ORDER — FLUTICASONE PROPIONATE 50 MCG/ACT NA SUSP: 2.0000 | Freq: Every day | NASAL | 5 refills | Status: DC | PRN

## 2020-02-03 ENCOUNTER — Ambulatory Visit: Payer: Medicare Other | Attending: Internal Medicine

## 2020-02-03 DIAGNOSIS — Z23 Encounter for immunization: Secondary | ICD-10-CM

## 2020-02-03 NOTE — Progress Notes (Signed)
   Covid-19 Vaccination Clinic  Name:  [name] Gibson    MRN: [id number] DOB: Aug 14, 1941  02/03/2020  [name] was observed post Covid-19 immunization for 15 minutes without incidence. He was provided with Vaccine Information Sheet and instruction to access the V-Safe system.   [name] was instructed to call 911 with any severe reactions post vaccine: Marland Kitchen Difficulty breathing  . Swelling of your face and throat  . A fast heartbeat  . A bad rash all over your body  . Dizziness and weakness    Immunizations Administered    Name Date Dose VIS Date Route   Pfizer COVID-19 Vaccine 02/03/2020 11:49 AM 0.3 mL 11/18/2019 Intramuscular   Manufacturer: [place]   Lot: [id number]   [place]: [id number]

## 2020-02-13 ENCOUNTER — Ambulatory Visit: Payer: Medicare Other | Admitting: Family Medicine

## 2020-02-23 ENCOUNTER — Other Ambulatory Visit: Payer: Self-pay | Admitting: Family Medicine

## 2020-02-28 ENCOUNTER — Ambulatory Visit: Payer: Medicare Other | Attending: Internal Medicine

## 2020-02-28 DIAGNOSIS — Z23 Encounter for immunization: Secondary | ICD-10-CM

## 2020-02-28 NOTE — Progress Notes (Signed)
   Covid-19 Vaccination Clinic  Name:  Herson Perry    MRN: [id number] DOB: 09-19-1941  02/28/2020  Mr. [name] was observed post Covid-19 immunization for 15 minutes without incident. He was provided with Vaccine Information Sheet and instruction to access the V-Safe system.   Mr. [name] was instructed to call 911 with any severe reactions post vaccine: Marland Kitchen Difficulty breathing  . Swelling of face and throat  . A fast heartbeat  . A bad rash all over body  . Dizziness and weakness   Immunizations Administered    Name Date Dose VIS Date Route   Pfizer COVID-19 Vaccine 02/28/2020  2:05 PM 0.3 mL 11/18/2019 Intramuscular   Manufacturer: [place]   Lot: [id number]   [place]: [id number]

## 2020-02-29 ENCOUNTER — Telehealth: Payer: Self-pay | Admitting: Family Medicine

## 2020-03-13 ENCOUNTER — Other Ambulatory Visit: Payer: Self-pay

## 2020-03-13 ENCOUNTER — Encounter: Payer: Self-pay | Admitting: Family Medicine

## 2020-03-13 ENCOUNTER — Ambulatory Visit (INDEPENDENT_AMBULATORY_CARE_PROVIDER_SITE_OTHER): Payer: Medicare Other | Admitting: Family Medicine

## 2020-03-13 VITALS — BP 140/80 | HR 87 | Temp 97.3°F | Resp 18 | Ht 65.5 in | Wt 183.0 lb

## 2020-03-13 DIAGNOSIS — R946 Abnormal results of thyroid function studies: Secondary | ICD-10-CM | POA: Diagnosis not present

## 2020-03-13 DIAGNOSIS — E785 Hyperlipidemia, unspecified: Secondary | ICD-10-CM | POA: Diagnosis not present

## 2020-03-13 DIAGNOSIS — I10 Essential (primary) hypertension: Secondary | ICD-10-CM

## 2020-03-13 LAB — COMPREHENSIVE METABOLIC PANEL
ALT: 32 U/L (ref 0–53)
AST: 30 U/L (ref 0–37)
Albumin: 4.5 g/dL (ref 3.5–5.2)
Alkaline Phosphatase: 45 U/L (ref 39–117)
BUN: 17 mg/dL (ref 6–23)
CO2: 29 mEq/L (ref 19–32)
Calcium: 9.7 mg/dL (ref 8.4–10.5)
Chloride: 99 mEq/L (ref 96–112)
Creatinine, Ser: 1 mg/dL (ref 0.40–1.50)
GFR: 86.84 mL/min (ref >60.00)
Glucose, Bld: 102 mg/dL — ABNORMAL HIGH (ref 70–99)
Potassium: 4.2 mEq/L (ref 3.5–5.1)
Sodium: 137 mEq/L (ref 135–145)
Total Bilirubin: 0.6 mg/dL (ref 0.2–1.2)
Total Protein: 7.3 g/dL (ref 6.0–8.3)

## 2020-03-13 LAB — LIPID PANEL
Cholesterol: 193 mg/dL (ref 0–200)
HDL: 62.5 mg/dL (ref >39.00)
LDL Cholesterol: 116 mg/dL — ABNORMAL HIGH (ref 0–99)
NonHDL: 130.36
Total CHOL/HDL Ratio: 3
Triglycerides: 72 mg/dL (ref 0.0–149.0)
VLDL: 14.4 mg/dL (ref 0.0–40.0)

## 2020-03-13 LAB — TSH: TSH: 3.22 u[IU]/mL (ref 0.35–4.50)

## 2020-03-13 NOTE — Assessment & Plan Note (Signed)
Tolerating statin, encouraged heart healthy diet, avoid trans fats, minimize simple carbs and saturated fats. Increase exercise as tolerated 

## 2020-03-13 NOTE — Progress Notes (Signed)
Patient ID: [name] Gibson, male    DOB: March 29, 1941  Age: 79 y.o. MRN: [id number]    Subjective:  Subjective  HPI [name] Gibson presents for f/u bp and chol   No complaints   Review of Systems  Constitutional: Negative for appetite change, diaphoresis, fatigue and unexpected weight change.  Eyes: Negative for pain, redness and visual disturbance.  Respiratory: Negative for cough, chest tightness, shortness of breath and wheezing.   Cardiovascular: Negative for chest pain, palpitations and leg swelling.  Endocrine: Negative for cold intolerance, heat intolerance, polydipsia, polyphagia and polyuria.  Genitourinary: Negative for difficulty urinating, dysuria and frequency.  Neurological: Negative for dizziness, light-headedness, numbness and headaches.    History Past Medical History:  Diagnosis Date  . ANGIOEDEMA 08/25/2007   Qualifier: Diagnosis of  By: Jerold Coombe    . ANKLE PAIN, RIGHT 11/16/2008   Qualifier: Diagnosis of  By: Jerold Coombe    . Cancer [facility])    prostate  . COLONIC POLYPS 10/22/2007   Qualifier: Diagnosis of  By: Jerold Coombe    . DIVERTICULOSIS OF COLON 10/22/2007   Qualifier: Diagnosis of  By: Jerold Coombe    . GLAUCOMA 03/23/2007   Qualifier: Diagnosis of  By: Jerold Coombe    . GLUCOSE INTOLERANCE, HX OF 03/23/2007   Qualifier: Diagnosis of  By: Jerold Coombe    . HYPERLIPIDEMIA 03/23/2007   Qualifier: Diagnosis of  By: Jerold Coombe    . HYPERTENSION 09/08/2007   Qualifier: Diagnosis of  By: Jerold Coombe    . HYPERTHYROIDISM 03/23/2007   Qualifier: Diagnosis of  By: Jerold Coombe  - no med  . Obesity (BMI 30-39.9) 12/07/2013  . PROSTATE CANCER, HX OF 10/02/2010   Qualifier: Diagnosis of  By: Jerold Coombe    . Right shoulder pain 02/08/2015  . Sebaceous cyst 12/13/2008   Qualifier: Diagnosis of  By: Jerold Coombe      He has a past surgical history that includes Appendectomy and radioactive seed implation.   His family  history includes Brain cancer in his brother; Breast cancer in his sister; Cancer in his father; Colon cancer in his maternal grandmother; Coronary artery disease in an other family member; Diabetes in an other family member; Diabetes Mellitus II in his brother; Hyperlipidemia in an other family member; Hypertension in an other family member; Kidney failure in his brother; Lung cancer in his brother; Stomach cancer in his sister; Stroke in his brother, brother, and sister; Throat cancer in his brother.He reports that he has never smoked. He has never used smokeless tobacco. He reports current alcohol use of about 1.0 standard drinks of alcohol per week. He reports that he does not use drugs.  Current Outpatient Medications on File Prior to Visit  Medication Sig Dispense Refill  . amLODipine (NORVASC) 10 MG tablet TAKE 1 TABLET(10 MG) BY MOUTH DAILY 90 tablet 1  . aspirin EC 325 MG tablet Take 325 mg by mouth every other day.    Marland Kitchen atorvastatin (LIPITOR) 20 MG tablet Take 1 tablet (20 mg total) by mouth daily. 90 tablet 1  . fluticasone (FLONASE) 50 MCG/ACT nasal spray Place 2 sprays into both nostrils daily as needed for allergies. 16 g 5  . hydrochlorothiazide (MICROZIDE) 12.5 MG capsule TAKE 1 CAPSULE(12.5 MG) BY MOUTH DAILY 90 capsule 0  . LUMIGAN 0.01 % SOLN Place 1 drop into both eyes at bedtime.   4  . metoprolol (TOPROL-XL) 200 MG  24 hr tablet TAKE 1 TABLET(200 MG) BY MOUTH DAILY 90 tablet 1  . Omega-3 Fatty Acids (FISH OIL) 1360 MG CAPS Take 1 capsule (1,360 mg total) by mouth daily. 30 capsule   . RHOPRESSA 0.02 % SOLN Place 1 drop into both eyes daily.     Marland Kitchen tiZANidine (ZANAFLEX) 4 MG tablet Take 1 tablet (4 mg total) by mouth every 6 (six) hours as needed for muscle spasms. 30 tablet 0   No current facility-administered medications on file prior to visit.     Objective:  Objective  Physical Exam Vitals and nursing note reviewed.  Constitutional:      General: He is sleeping.      Appearance: He is well-developed.  HENT:     Head: Normocephalic and atraumatic.  Eyes:     Pupils: Pupils are equal, round, and reactive to light.  Neck:     Thyroid: No thyromegaly.  Cardiovascular:     Rate and Rhythm: Normal rate and regular rhythm.     Heart sounds: No murmur.  Pulmonary:     Effort: Pulmonary effort is normal. No respiratory distress.     Breath sounds: Normal breath sounds. No wheezing or rales.  Chest:     Chest wall: No tenderness.  Musculoskeletal:        General: No tenderness.     Cervical back: Normal range of motion and neck supple.  Skin:    General: Skin is warm and dry.  Neurological:     Mental Status: He is oriented to person, place, and time.  Psychiatric:        Behavior: Behavior normal.        Thought Content: Thought content normal.        Judgment: Judgment normal.    BP 140/80 (BP Location: Right Arm, Patient Position: Sitting, Cuff Size: Normal)   Pulse 87   Temp (!) 97.3 F (36.3 C) (Temporal)   Resp 18   Ht 5' 5.5" (1.664 m)   Wt 183 lb (83 kg)   SpO2 97%   BMI 29.99 kg/m  Wt Readings from Last 3 Encounters:  03/13/20 183 lb (83 kg)  09/13/19 180 lb (81.6 kg)  03/30/19 182 lb (82.6 kg)     Lab Results  Component Value Date   WBC 5.1 07/05/2019   HGB 14.2 07/05/2019   HCT 42.5 07/05/2019   PLT 174.0 07/05/2019   GLUCOSE 95 09/13/2019   CHOL 179 09/13/2019   TRIG 58.0 09/13/2019   HDL 60.60 09/13/2019   LDLDIRECT 173.4 03/16/2013   LDLCALC 107 (H) 09/13/2019   ALT 26 09/13/2019   AST 25 09/13/2019   NA 137 09/13/2019   K 3.7 09/13/2019   CL 100 09/13/2019   CREATININE 1.01 09/13/2019   BUN 18 09/13/2019   CO2 28 09/13/2019   TSH 2.28 12/14/2014   PSA 0.02 (L) 09/13/2019   INR 1.03 12/12/2009   HGBA1C 6.0 10/03/2016   MICROALBUR 0.6 12/14/2014    CT Head Wo Contrast  Result Date: 09/10/2018 CLINICAL DATA:  Fall, head injury and facial laceration. EXAM: CT HEAD WITHOUT CONTRAST CT MAXILLOFACIAL WITHOUT  CONTRAST TECHNIQUE: Multidetector CT imaging of the head and maxillofacial structures were performed using the standard protocol without intravenous contrast. Multiplanar CT image reconstructions of the maxillofacial structures were also generated. COMPARISON:  MRI head February 14, 2016 FINDINGS: CT HEAD FINDINGS BRAIN: No intraparenchymal hemorrhage, mass effect nor midline shift. Mild disproportionate bifrontal volume loss with ex vacuo dilatation frontal  horns, no hydrocephalus. Patchy supratentorial white matter hypodensities within normal range for patient's age, though non-specific are most compatible with chronic small vessel ischemic disease. No acute large vascular territory infarcts. No abnormal extra-axial fluid collections. Basal cisterns are patent. VASCULAR: Moderate calcific atherosclerosis of the carotid siphons. SKULL: No skull fracture. Large RIGHT frontal periorbital scalp hematoma without subcutaneous gas or radiopaque foreign bodies. OTHER: None. CT MAXILLOFACIAL FINDINGS OSSEOUS: No acute facial fracture. The mandible is intact, the condyles are located. No destructive bony lesions. 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 ORBITS: Ocular globes and orbital contents are normal. SINUSES: Paranasal sinuses are well aerated. Intact nasal septum is midline. Mastoid aircells are well aerated. SOFT TISSUES: Large RIGHT frontal to periorbital scalp hematoma without subcutaneous gas or radiopaque foreign bodies. IMPRESSION: CT HEAD: 1. No acute intracranial process. 2. Stable mild parenchymal brain volume loss. CT maxillofacial: 1. No acute facial fracture. 2. Large RIGHT frontal to periorbital scalp hematoma. No postseptal involvement. Electronically Signed   By: Elon Alas M.D.   On: 09/10/2018 19:40   DG Knee Complete 4 Views Right  Result Date: 09/10/2018 CLINICAL DATA:  Knee pain and laceration after a fall today. EXAM: RIGHT KNEE - COMPLETE 4+ VIEW  COMPARISON:  None. FINDINGS: No evidence of fracture, dislocation, or joint effusion. No evidence of arthropathy or other focal bone abnormality. Soft tissues are unremarkable. IMPRESSION: Negative. Electronically Signed   By: Misty Stanley M.D.   On: 09/10/2018 18:24   CT Maxillofacial Wo Contrast  Result Date: 09/10/2018 CLINICAL DATA:  Fall, head injury and facial laceration. EXAM: CT HEAD WITHOUT CONTRAST CT MAXILLOFACIAL WITHOUT CONTRAST TECHNIQUE: Multidetector CT imaging of the head and maxillofacial structures were performed using the standard protocol without intravenous contrast. Multiplanar CT image reconstructions of the maxillofacial structures were also generated. COMPARISON:  MRI head February 14, 2016 FINDINGS: CT HEAD FINDINGS BRAIN: No intraparenchymal hemorrhage, mass effect nor midline shift. Mild disproportionate bifrontal volume loss with ex vacuo dilatation frontal horns, no hydrocephalus. Patchy supratentorial white matter hypodensities within normal range for patient's age, though non-specific are most compatible with chronic small vessel ischemic disease. No acute large vascular territory infarcts. No abnormal extra-axial fluid collections. Basal cisterns are patent. VASCULAR: Moderate calcific atherosclerosis of the carotid siphons. SKULL: No skull fracture. Large RIGHT frontal periorbital scalp hematoma without subcutaneous gas or radiopaque foreign bodies. OTHER: None. CT MAXILLOFACIAL FINDINGS OSSEOUS: No acute facial fracture. The mandible is intact, the condyles are located. No destructive bony lesions. 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 ORBITS: Ocular globes and orbital contents are normal. SINUSES: Paranasal sinuses are well aerated. Intact nasal septum is midline. Mastoid aircells are well aerated. SOFT TISSUES: Large RIGHT frontal to periorbital scalp hematoma without subcutaneous gas or radiopaque foreign bodies. IMPRESSION: CT HEAD: 1.  No acute intracranial process. 2. Stable mild parenchymal brain volume loss. CT maxillofacial: 1. No acute facial fracture. 2. Large RIGHT frontal to periorbital scalp hematoma. No postseptal involvement. Electronically Signed   By: Elon Alas M.D.   On: 09/10/2018 19:40     Assessment & Plan:  Plan  I am having [name] Gibson maintain his Lumigan, Rhopressa, aspirin EC, Fish Oil, tiZANidine, amLODipine, metoprolol, atorvastatin, fluticasone, and hydrochlorothiazide.  No orders of the defined types were placed in this encounter.   Problem List Items Addressed This Visit      Unprioritized   Essential hypertension - Primary    Well controlled, no changes  to meds. Encouraged heart healthy diet such as the DASH diet and exercise as tolerated.       Relevant Orders   Lipid panel   Comprehensive metabolic panel   Hyperlipidemia    Tolerating statin, encouraged heart healthy diet, avoid trans fats, minimize simple carbs and saturated fats. Increase exercise as tolerated      Relevant Orders   Lipid panel   Comprehensive metabolic panel   Thyroid function study abnormality    Check labs today      Relevant Orders   TSH      Follow-up: Return in about 6 months (around 09/12/2020), or if symptoms worsen or fail to improve, for fasting, annual exam.  [name], DO

## 2020-03-13 NOTE — Assessment & Plan Note (Signed)
Well controlled, no changes to meds. Encouraged heart healthy diet such as the DASH diet and exercise as tolerated.  °

## 2020-03-13 NOTE — Assessment & Plan Note (Signed)
Check labs today.

## 2020-03-13 NOTE — Patient Instructions (Signed)
DASH Eating Plan DASH stands for "Dietary Approaches to Stop Hypertension." The DASH eating plan is a healthy eating plan that has been shown to reduce high blood pressure (hypertension). It may also reduce your risk for type 2 diabetes, heart disease, and stroke. The DASH eating plan may also help with weight loss. What are tips for following this plan?  General guidelines  Avoid eating more than 2,300 mg (milligrams) of salt (sodium) a day. If you have hypertension, you may need to reduce your sodium intake to 1,500 mg a day.  Limit alcohol intake to no more than 1 drink a day for nonpregnant women and 2 drinks a day for men. One drink equals 12 oz of beer, 5 oz of wine, or 1 oz of hard liquor.  Work with your health care provider to maintain a healthy body weight or to lose weight. Ask what an ideal weight is for you.  Get at least 30 minutes of exercise that causes your heart to beat faster (aerobic exercise) most days of the week. Activities may include walking, swimming, or biking.  Work with your health care provider or diet and nutrition specialist (dietitian) to adjust your eating plan to your individual calorie needs. Reading food labels   Check food labels for the amount of sodium per serving. Choose foods with less than 5 percent of the Daily Value of sodium. Generally, foods with less than 300 mg of sodium per serving fit into this eating plan.  To find whole grains, look for the word "whole" as the first word in the ingredient list. Shopping  Buy products labeled as "low-sodium" or "no salt added."  Buy fresh foods. Avoid canned foods and premade or frozen meals. Cooking  Avoid adding salt when cooking. Use salt-free seasonings or herbs instead of table salt or sea salt. Check with your health care provider or pharmacist before using salt substitutes.  Do not fry foods. Cook foods using healthy methods such as baking, boiling, grilling, and broiling instead.  Cook with  heart-healthy oils, such as olive, canola, soybean, or sunflower oil. Meal planning  Eat a balanced diet that includes: ? 5 or more servings of fruits and vegetables each day. At each meal, try to fill half of your plate with fruits and vegetables. ? Up to 6-8 servings of whole grains each day. ? Less than 6 oz of lean meat, poultry, or fish each day. A 3-oz serving of meat is about the same size as a deck of cards. One egg equals 1 oz. ? 2 servings of low-fat dairy each day. ? A serving of nuts, seeds, or beans 5 times each week. ? Heart-healthy fats. Healthy fats called Omega-3 fatty acids are found in foods such as flaxseeds and coldwater fish, like sardines, salmon, and mackerel.  Limit how much you eat of the following: ? Canned or prepackaged foods. ? Food that is high in trans fat, such as fried foods. ? Food that is high in saturated fat, such as fatty meat. ? Sweets, desserts, sugary drinks, and other foods with added sugar. ? Full-fat dairy products.  Do not salt foods before eating.  Try to eat at least 2 vegetarian meals each week.  Eat more home-cooked food and less restaurant, buffet, and fast food.  When eating at a restaurant, ask that your food be prepared with less salt or no salt, if possible. What foods are recommended? The items listed may not be a complete list. Talk with your dietitian about   what dietary choices are best for you. Grains Whole-grain or whole-wheat bread. Whole-grain or whole-wheat pasta. Brown rice. Oatmeal. Quinoa. Bulgur. Whole-grain and low-sodium cereals. Pita bread. Low-fat, low-sodium crackers. Whole-wheat flour tortillas. Vegetables Fresh or frozen vegetables (raw, steamed, roasted, or grilled). Low-sodium or reduced-sodium tomato and vegetable juice. Low-sodium or reduced-sodium tomato sauce and tomato paste. Low-sodium or reduced-sodium canned vegetables. Fruits All fresh, dried, or frozen fruit. Canned fruit in natural juice (without  added sugar). Meat and other protein foods Skinless chicken or turkey. Ground chicken or turkey. Pork with fat trimmed off. Fish and seafood. Egg whites. Dried beans, peas, or lentils. Unsalted nuts, nut butters, and seeds. Unsalted canned beans. Lean cuts of beef with fat trimmed off. Low-sodium, lean deli meat. Dairy Low-fat (1%) or fat-free (skim) milk. Fat-free, low-fat, or reduced-fat cheeses. Nonfat, low-sodium ricotta or cottage cheese. Low-fat or nonfat yogurt. Low-fat, low-sodium cheese. Fats and oils Soft margarine without trans fats. Vegetable oil. Low-fat, reduced-fat, or light mayonnaise and salad dressings (reduced-sodium). Canola, safflower, olive, soybean, and sunflower oils. Avocado. Seasoning and other foods Herbs. Spices. Seasoning mixes without salt. Unsalted popcorn and pretzels. Fat-free sweets. What foods are not recommended? The items listed may not be a complete list. Talk with your dietitian about what dietary choices are best for you. Grains Baked goods made with fat, such as croissants, muffins, or some breads. Dry pasta or rice meal packs. Vegetables Creamed or fried vegetables. Vegetables in a cheese sauce. Regular canned vegetables (not low-sodium or reduced-sodium). Regular canned tomato sauce and paste (not low-sodium or reduced-sodium). Regular tomato and vegetable juice (not low-sodium or reduced-sodium). Pickles. Olives. Fruits Canned fruit in a light or heavy syrup. Fried fruit. Fruit in cream or butter sauce. Meat and other protein foods Fatty cuts of meat. Ribs. Fried meat. Bacon. Sausage. Bologna and other processed lunch meats. Salami. Fatback. Hotdogs. Bratwurst. Salted nuts and seeds. Canned beans with added salt. Canned or smoked fish. Whole eggs or egg yolks. Chicken or turkey with skin. Dairy Whole or 2% milk, cream, and half-and-half. Whole or full-fat cream cheese. Whole-fat or sweetened yogurt. Full-fat cheese. Nondairy creamers. Whipped toppings.  Processed cheese and cheese spreads. Fats and oils Butter. Stick margarine. Lard. Shortening. Ghee. Bacon fat. Tropical oils, such as coconut, palm kernel, or palm oil. Seasoning and other foods Salted popcorn and pretzels. Onion salt, garlic salt, seasoned salt, table salt, and sea salt. Worcestershire sauce. Tartar sauce. Barbecue sauce. Teriyaki sauce. Soy sauce, including reduced-sodium. Steak sauce. Canned and packaged gravies. Fish sauce. Oyster sauce. Cocktail sauce. Horseradish that you find on the shelf. Ketchup. Mustard. Meat flavorings and tenderizers. Bouillon cubes. Hot sauce and Tabasco sauce. Premade or packaged marinades. Premade or packaged taco seasonings. Relishes. Regular salad dressings. Where to find more information:  National Heart, Lung, and Blood Institute: www.nhlbi.nih.gov  American Heart Association: www.heart.org Summary  The DASH eating plan is a healthy eating plan that has been shown to reduce high blood pressure (hypertension). It may also reduce your risk for type 2 diabetes, heart disease, and stroke.  With the DASH eating plan, you should limit salt (sodium) intake to 2,300 mg a day. If you have hypertension, you may need to reduce your sodium intake to 1,500 mg a day.  When on the DASH eating plan, aim to eat more fresh fruits and vegetables, whole grains, lean proteins, low-fat dairy, and heart-healthy fats.  Work with your health care provider or diet and nutrition specialist (dietitian) to adjust your eating plan to your   individual calorie needs. This information is not intended to replace advice given to you by your health care provider. Make sure you discuss any questions you have with your health care provider. Document Revised: 11/06/2017 Document Reviewed: 11/17/2016 Elsevier Patient Education  2020 Elsevier Inc.  

## 2020-03-27 DIAGNOSIS — H401133 Primary open-angle glaucoma, bilateral, severe stage: Secondary | ICD-10-CM | POA: Diagnosis not present

## 2020-05-23 ENCOUNTER — Other Ambulatory Visit: Payer: Self-pay

## 2020-05-23 MED ORDER — HYDROCHLOROTHIAZIDE 12.5 MG PO CAPS: ORAL_CAPSULE | ORAL | 0 refills | Status: DC

## 2020-05-23 MED ORDER — METOPROLOL SUCCINATE ER 200 MG PO TB24: ORAL_TABLET | ORAL | 1 refills | Status: DC

## 2020-07-04 DIAGNOSIS — L910 Hypertrophic scar: Secondary | ICD-10-CM | POA: Diagnosis not present

## 2020-07-26 ENCOUNTER — Other Ambulatory Visit: Payer: Self-pay | Admitting: *Deleted

## 2020-07-26 MED ORDER — AMLODIPINE BESYLATE 10 MG PO TABS: ORAL_TABLET | ORAL | 1 refills | Status: DC

## 2020-08-01 ENCOUNTER — Telehealth: Payer: Self-pay | Admitting: Family Medicine

## 2020-08-01 NOTE — Progress Notes (Signed)
  Chronic Care Management   Note  08/01/2020 Name: Sharvil Hoey MRN: [id number] DOB: 1941-08-21  Jermine Bibbee is a 79 y.o. year old male who is a primary care patient of [name], DO. I reached out to [employer] by phone today in response to a referral sent by Mr. Sarp [name]'s PCP, Carollee Herter, Alferd Apa, DO.   Mr. [name] was given information about Chronic Care Management services today including:  1. CCM service includes personalized support from designated clinical staff supervised by his physician, including individualized plan of care and coordination with other care providers 2. 24/7 contact phone numbers for assistance for urgent and routine care needs. 3. Service will only be billed when office clinical staff spend 20 minutes or more in a month to coordinate care. 4. Only one practitioner may furnish and bill the service in a calendar month. 5. The patient may stop CCM services at any time (effective at the end of the month) by phone call to the office staff.   Patient agreed to services and verbal consent obtained.   Follow up plan:   Carley Perdue [employer] Scheduler

## 2020-08-02 DIAGNOSIS — H401133 Primary open-angle glaucoma, bilateral, severe stage: Secondary | ICD-10-CM | POA: Diagnosis not present

## 2020-08-17 ENCOUNTER — Other Ambulatory Visit: Payer: Self-pay | Admitting: Family Medicine

## 2020-10-24 ENCOUNTER — Other Ambulatory Visit: Payer: Self-pay

## 2020-10-24 DIAGNOSIS — E785 Hyperlipidemia, unspecified: Secondary | ICD-10-CM

## 2020-10-24 DIAGNOSIS — I10 Essential (primary) hypertension: Secondary | ICD-10-CM

## 2020-10-26 ENCOUNTER — Telehealth: Payer: Self-pay | Admitting: Pharmacist

## 2020-10-26 NOTE — Progress Notes (Addendum)
    Chronic Care Management Pharmacy Assistant   Name: [name] Gibson  MRN: [id number] DOB: May 27, 1941  Reason for Encounter: Initial Questions   PCP : [name], DO  Allergies:   Allergies  Allergen Reactions   Penicillins     REACTION: unspecified Has patient had a PCN reaction causing immediate rash, facial/tongue/throat swelling, SOB or lightheadedness with hypotension: N Has patient had a PCN reaction causing severe rash involving mucus membranes or skin necrosis: N Has patient had a PCN reaction that required hospitalization: N Has patient had a PCN reaction occurring within the last 10 years: N If all of the above answers are "NO", then may proceed with Cephalosporin use.     Medications: Outpatient Encounter Medications as of 10/26/2020  Medication Sig Note   amLODipine (NORVASC) 10 MG tablet TAKE 1 TABLET(10 MG) BY MOUTH DAILY    aspirin EC 325 MG tablet Take 325 mg by mouth every other day.    atorvastatin (LIPITOR) 20 MG tablet Take 1 tablet (20 mg total) by mouth daily.    fluticasone (FLONASE) 50 MCG/ACT nasal spray Place 2 sprays into both nostrils daily as needed for allergies.    hydrochlorothiazide (MICROZIDE) 12.5 MG capsule TAKE 1 CAPSULE(12.5 MG) BY MOUTH DAILY    LUMIGAN 0.01 % SOLN Place 1 drop into both eyes at bedtime.  02/08/2015: Received from: External Pharmacy   metoprolol (TOPROL-XL) 200 MG 24 hr tablet TAKE 1 TABLET(200 MG) BY MOUTH DAILY    Omega-3 Fatty Acids (FISH OIL) 1360 MG CAPS Take 1 capsule (1,360 mg total) by mouth daily.    RHOPRESSA 0.02 % SOLN Place 1 drop into both eyes daily.     tiZANidine (ZANAFLEX) 4 MG tablet Take 1 tablet (4 mg total) by mouth every 6 (six) hours as needed for muscle spasms.    No facility-administered encounter medications on file as of 10/26/2020.    Current Diagnosis: Patient Active Problem List   Diagnosis Date Noted   Thyroid function study abnormality 03/13/2020   Pain in upper jaw 03/30/2019     Acute pain of right knee 09/28/2018   Traumatic hematoma of head 09/28/2018   Preventative health care 03/29/2016   Dizziness 01/22/2016   Obesity (BMI 30-39.9) 12/07/2013   History of prostate cancer 10/02/2010   PSA, INCREASED 08/08/2009   URI 12/13/2008   ANKLE PAIN, RIGHT 11/16/2008   SINUSITIS- ACUTE-NOS 09/12/2008   COLONIC POLYPS 10/22/2007   DIVERTICULOSIS OF COLON 10/22/2007   Essential hypertension 09/08/2007   ANGIOEDEMA 08/25/2007   HYPERTHYROIDISM 03/23/2007   Hyperlipidemia 03/23/2007   GLAUCOMA 03/23/2007   GLUCOSE INTOLERANCE, HX OF 03/23/2007    Goals Addressed   None     Attempted to review initial questions with the patient, but was unsuccessful. I did explain what the CCM program was in detail to the patient and reviewed all questions he had. Patient requested to have his appointment at the time of his PCP visit. I was unable to change his appointment time. I did offer a telephone visit, but the patient declined.  Documented preliminary medication plan in preparation for patient's initial chronic care management visit.   Follow-Up:  Pharmacist Review   Fanny Skates, [place] Pharmacist Assistant [phone]  Reviewed by: De Blanch, PharmD Clinical Pharmacist [facility] [phone]

## 2020-10-29 ENCOUNTER — Ambulatory Visit (INDEPENDENT_AMBULATORY_CARE_PROVIDER_SITE_OTHER): Payer: Medicare Other | Admitting: Family Medicine

## 2020-10-29 ENCOUNTER — Other Ambulatory Visit: Payer: Self-pay

## 2020-10-29 ENCOUNTER — Encounter: Payer: Self-pay | Admitting: Family Medicine

## 2020-10-29 ENCOUNTER — Ambulatory Visit: Payer: Medicare Other

## 2020-10-29 VITALS — BP 160/108 | HR 65 | Temp 98.2°F | Resp 18 | Ht 65.5 in | Wt 183.6 lb

## 2020-10-29 DIAGNOSIS — E785 Hyperlipidemia, unspecified: Secondary | ICD-10-CM

## 2020-10-29 DIAGNOSIS — G4700 Insomnia, unspecified: Secondary | ICD-10-CM

## 2020-10-29 DIAGNOSIS — Z23 Encounter for immunization: Secondary | ICD-10-CM

## 2020-10-29 DIAGNOSIS — I10 Essential (primary) hypertension: Secondary | ICD-10-CM

## 2020-10-29 LAB — COMPREHENSIVE METABOLIC PANEL
ALT: 29 U/L (ref 0–53)
AST: 26 U/L (ref 0–37)
Albumin: 4.5 g/dL (ref 3.5–5.2)
Alkaline Phosphatase: 36 U/L — ABNORMAL LOW (ref 39–117)
BUN: 19 mg/dL (ref 6–23)
CO2: 31 mEq/L (ref 19–32)
Calcium: 9.9 mg/dL (ref 8.4–10.5)
Chloride: 98 mEq/L (ref 96–112)
Creatinine, Ser: 1.04 mg/dL (ref 0.40–1.50)
GFR: 67.41 mL/min (ref >60.00)
Glucose, Bld: 97 mg/dL (ref 70–99)
Potassium: 4 mEq/L (ref 3.5–5.1)
Sodium: 137 mEq/L (ref 135–145)
Total Bilirubin: 0.5 mg/dL (ref 0.2–1.2)
Total Protein: 7.2 g/dL (ref 6.0–8.3)

## 2020-10-29 LAB — LIPID PANEL
Cholesterol: 204 mg/dL — ABNORMAL HIGH (ref 0–200)
HDL: 65.7 mg/dL (ref >39.00)
LDL Cholesterol: 128 mg/dL — ABNORMAL HIGH (ref 0–99)
NonHDL: 138.6
Total CHOL/HDL Ratio: 3
Triglycerides: 52 mg/dL (ref 0.0–149.0)
VLDL: 10.4 mg/dL (ref 0.0–40.0)

## 2020-10-29 MED ORDER — LOSARTAN POTASSIUM 50 MG PO TABS: 50.0000 mg | ORAL_TABLET | Freq: Every day | ORAL | 2 refills | Status: DC

## 2020-10-29 MED ORDER — TRAZODONE HCL 50 MG PO TABS: 25.0000 mg | ORAL_TABLET | Freq: Every evening | ORAL | 3 refills | Status: DC | PRN

## 2020-10-29 NOTE — Assessment & Plan Note (Signed)
Poorly controlled will alter medications, encouraged DASH diet, minimize caffeine and obtain adequate sleep. Report concerning symptoms and follow up as directed and as needed 

## 2020-10-29 NOTE — Assessment & Plan Note (Signed)
Encouraged heart healthy diet, increase exercise, avoid trans fats, consider a krill oil cap daily 

## 2020-10-29 NOTE — Progress Notes (Signed)
Patient ID: [name] Gibson, male    DOB: [birth year]  Age: 79 y.o. MRN: [id number]    Subjective:  Subjective  HPI [name] presents for f/u bp and cholesterol   Pt is c/o not sleeping due to a lot of death in the family   Review of Systems  Constitutional: Negative for appetite change, diaphoresis, fatigue and unexpected weight change.  Eyes: Negative for pain, redness and visual disturbance.  Respiratory: Negative for cough, chest tightness, shortness of breath and wheezing.   Cardiovascular: Negative for chest pain, palpitations and leg swelling.  Endocrine: Negative for cold intolerance, heat intolerance, polydipsia, polyphagia and polyuria.  Genitourinary: Negative for difficulty urinating, dysuria and frequency.  Neurological: Negative for dizziness, light-headedness, numbness and headaches.    History Past Medical History:  Diagnosis Date  . ANGIOEDEMA 08/25/2007   Qualifier: Diagnosis of  By: Jerold Coombe    . ANKLE PAIN, RIGHT 11/16/2008   Qualifier: Diagnosis of  By: Jerold Coombe    . Cancer [facility])    prostate  . COLONIC POLYPS 10/22/2007   Qualifier: Diagnosis of  By: Jerold Coombe    . DIVERTICULOSIS OF COLON 10/22/2007   Qualifier: Diagnosis of  By: Jerold Coombe    . GLAUCOMA 03/23/2007   Qualifier: Diagnosis of  By: Jerold Coombe    . GLUCOSE INTOLERANCE, HX OF 03/23/2007   Qualifier: Diagnosis of  By: Jerold Coombe    . HYPERLIPIDEMIA 03/23/2007   Qualifier: Diagnosis of  By: Jerold Coombe    . HYPERTENSION 09/08/2007   Qualifier: Diagnosis of  By: Jerold Coombe    . HYPERTHYROIDISM 03/23/2007   Qualifier: Diagnosis of  By: Jerold Coombe  - no med  . Obesity (BMI 30-39.9) 12/07/2013  . PROSTATE CANCER, HX OF 10/02/2010   Qualifier: Diagnosis of  By: Jerold Coombe    . Right shoulder pain 02/08/2015  . Sebaceous cyst 12/13/2008   Qualifier: Diagnosis of  By: Jerold Coombe      He has a past surgical history that includes  Appendectomy and radioactive seed implation.   His family history includes Brain cancer in his brother; Breast cancer in his sister; Cancer in his father; Colon cancer in his maternal grandmother; Coronary artery disease in an other family member; Diabetes in an other family member; Diabetes Mellitus II in his brother; Hyperlipidemia in an other family member; Hypertension in an other family member; Kidney failure in his brother; Lung cancer in his brother; Stomach cancer in his sister; Stroke in his brother, brother, and sister; Throat cancer in his brother.He reports that he has never smoked. He has never used smokeless tobacco. He reports current alcohol use of about 1.0 standard drink of alcohol per week. He reports that he does not use drugs.  Current Outpatient Medications on File Prior to Visit  Medication Sig Dispense Refill  . amLODipine (NORVASC) 10 MG tablet TAKE 1 TABLET(10 MG) BY MOUTH DAILY 90 tablet 1  . aspirin EC 325 MG tablet Take 325 mg by mouth every other day.    Marland Kitchen atorvastatin (LIPITOR) 20 MG tablet Take 1 tablet (20 mg total) by mouth daily. 90 tablet 1  . fluticasone (FLONASE) 50 MCG/ACT nasal spray Place 2 sprays into both nostrils daily as needed for allergies. 16 g 5  . hydrochlorothiazide (MICROZIDE) 12.5 MG capsule TAKE 1 CAPSULE(12.5 MG) BY MOUTH DAILY 90 capsule 0  . LUMIGAN 0.01 % SOLN Place 1 drop into both  eyes at bedtime.   4  . metoprolol (TOPROL-XL) 200 MG 24 hr tablet TAKE 1 TABLET(200 MG) BY MOUTH DAILY 90 tablet 1  . Omega-3 Fatty Acids (FISH OIL) 1360 MG CAPS Take 1 capsule (1,360 mg total) by mouth daily. 30 capsule   . RHOPRESSA 0.02 % SOLN Place 1 drop into both eyes daily.     Marland Kitchen tiZANidine (ZANAFLEX) 4 MG tablet Take 1 tablet (4 mg total) by mouth every 6 (six) hours as needed for muscle spasms. 30 tablet 0   No current facility-administered medications on file prior to visit.     Objective:  Objective  Physical Exam Vitals and nursing note  reviewed.  Constitutional:      General: He is sleeping. He is not in acute distress.    Appearance: He is well-developed.  HENT:     Head: Normocephalic and atraumatic.  Eyes:     Pupils: Pupils are equal, round, and reactive to light.  Neck:     Thyroid: No thyromegaly.  Cardiovascular:     Rate and Rhythm: Normal rate and regular rhythm.     Heart sounds: Normal heart sounds. No murmur heard.   Pulmonary:     Effort: Pulmonary effort is normal. No respiratory distress.     Breath sounds: Normal breath sounds. No wheezing or rales.  Chest:     Chest wall: No tenderness.  Musculoskeletal:        General: No tenderness.     Cervical back: Normal range of motion and neck supple.  Skin:    General: Skin is warm and dry.  Neurological:     Mental Status: He is oriented to person, place, and time.  Psychiatric:        Behavior: Behavior normal.        Thought Content: Thought content normal.        Judgment: Judgment normal.    BP (!) 160/108   Pulse 65   Temp 98.2 F (36.8 C) (Oral)   Resp 18   Ht 5' 5.5" (1.664 m)   Wt 183 lb 9.6 oz (83.3 kg)   SpO2 100%   BMI 30.09 kg/m  Wt Readings from Last 3 Encounters:  10/29/20 183 lb 9.6 oz (83.3 kg)  03/13/20 183 lb (83 kg)  09/13/19 180 lb (81.6 kg)     Lab Results  Component Value Date   WBC 5.1 07/05/2019   HGB 14.2 07/05/2019   HCT 42.5 07/05/2019   PLT 174.0 07/05/2019   GLUCOSE 102 (H) 03/13/2020   CHOL 193 03/13/2020   TRIG 72.0 03/13/2020   HDL 62.50 03/13/2020   LDLDIRECT 173.4 03/16/2013   LDLCALC 116 (H) 03/13/2020   ALT 32 03/13/2020   AST 30 03/13/2020   NA 137 03/13/2020   K 4.2 03/13/2020   CL 99 03/13/2020   CREATININE 1.00 03/13/2020   BUN 17 03/13/2020   CO2 29 03/13/2020   TSH 3.22 03/13/2020   PSA 0.02 (L) 09/13/2019   INR 1.03 12/12/2009   HGBA1C 6.0 10/03/2016   MICROALBUR 0.6 12/14/2014    CT Head Wo Contrast  Result Date: 09/10/2018 CLINICAL DATA:  Fall, head injury and facial  laceration. EXAM: CT HEAD WITHOUT CONTRAST CT MAXILLOFACIAL WITHOUT CONTRAST TECHNIQUE: Multidetector CT imaging of the head and maxillofacial structures were performed using the standard protocol without intravenous contrast. Multiplanar CT image reconstructions of the maxillofacial structures were also generated. COMPARISON:  MRI head February 14, 2016 FINDINGS: CT HEAD FINDINGS BRAIN: No  intraparenchymal hemorrhage, mass effect nor midline shift. Mild disproportionate bifrontal volume loss with ex vacuo dilatation frontal horns, no hydrocephalus. Patchy supratentorial white matter hypodensities within normal range for patient's age, though non-specific are most compatible with chronic small vessel ischemic disease. No acute large vascular territory infarcts. No abnormal extra-axial fluid collections. Basal cisterns are patent. VASCULAR: Moderate calcific atherosclerosis of the carotid siphons. SKULL: No skull fracture. Large RIGHT frontal periorbital scalp hematoma without subcutaneous gas or radiopaque foreign bodies. OTHER: None. CT MAXILLOFACIAL FINDINGS OSSEOUS: No acute facial fracture. The mandible is intact, the condyles are located. No destructive bony lesions. 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 ORBITS: Ocular globes and orbital contents are normal. SINUSES: Paranasal sinuses are well aerated. Intact nasal septum is midline. Mastoid aircells are well aerated. SOFT TISSUES: Large RIGHT frontal to periorbital scalp hematoma without subcutaneous gas or radiopaque foreign bodies. IMPRESSION: CT HEAD: 1. No acute intracranial process. 2. Stable mild parenchymal brain volume loss. CT maxillofacial: 1. No acute facial fracture. 2. Large RIGHT frontal to periorbital scalp hematoma. No postseptal involvement. Electronically Signed   By: Elon Alas M.D.   On: 09/10/2018 19:40   DG Knee Complete 4 Views Right  Result Date: 09/10/2018 CLINICAL DATA:  Knee pain and  laceration after a fall today. EXAM: RIGHT KNEE - COMPLETE 4+ VIEW COMPARISON:  None. FINDINGS: No evidence of fracture, dislocation, or joint effusion. No evidence of arthropathy or other focal bone abnormality. Soft tissues are unremarkable. IMPRESSION: Negative. Electronically Signed   By: Misty Stanley M.D.   On: 09/10/2018 18:24   CT Maxillofacial Wo Contrast  Result Date: 09/10/2018 CLINICAL DATA:  Fall, head injury and facial laceration. EXAM: CT HEAD WITHOUT CONTRAST CT MAXILLOFACIAL WITHOUT CONTRAST TECHNIQUE: Multidetector CT imaging of the head and maxillofacial structures were performed using the standard protocol without intravenous contrast. Multiplanar CT image reconstructions of the maxillofacial structures were also generated. COMPARISON:  MRI head February 14, 2016 FINDINGS: CT HEAD FINDINGS BRAIN: No intraparenchymal hemorrhage, mass effect nor midline shift. Mild disproportionate bifrontal volume loss with ex vacuo dilatation frontal horns, no hydrocephalus. Patchy supratentorial white matter hypodensities within normal range for patient's age, though non-specific are most compatible with chronic small vessel ischemic disease. No acute large vascular territory infarcts. No abnormal extra-axial fluid collections. Basal cisterns are patent. VASCULAR: Moderate calcific atherosclerosis of the carotid siphons. SKULL: No skull fracture. Large RIGHT frontal periorbital scalp hematoma without subcutaneous gas or radiopaque foreign bodies. OTHER: None. CT MAXILLOFACIAL FINDINGS OSSEOUS: No acute facial fracture. The mandible is intact, the condyles are located. No destructive bony lesions. 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 ORBITS: Ocular globes and orbital contents are normal. SINUSES: Paranasal sinuses are well aerated. Intact nasal septum is midline. Mastoid aircells are well aerated. SOFT TISSUES: Large RIGHT frontal to periorbital scalp hematoma without  subcutaneous gas or radiopaque foreign bodies. IMPRESSION: CT HEAD: 1. No acute intracranial process. 2. Stable mild parenchymal brain volume loss. CT maxillofacial: 1. No acute facial fracture. 2. Large RIGHT frontal to periorbital scalp hematoma. No postseptal involvement. Electronically Signed   By: Elon Alas M.D.   On: 09/10/2018 19:40     Assessment & Plan:  Plan  I am having Esther [name] start on traZODone and losartan. I am also having him maintain his Lumigan, Rhopressa, aspirin EC, Fish Oil, tiZANidine, atorvastatin, fluticasone, metoprolol, amLODipine, and hydrochlorothiazide.  Meds ordered this encounter  Medications  . traZODone (DESYREL) 50  MG tablet    Sig: Take 0.5-1 tablets (25-50 mg total) by mouth at bedtime as needed for sleep.    Dispense:  30 tablet    Refill:  3  . losartan (COZAAR) 50 MG tablet    Sig: Take 1 tablet (50 mg total) by mouth daily.    Dispense:  30 tablet    Refill:  2    Problem List Items Addressed This Visit      Unprioritized   Hyperlipidemia    Encouraged heart healthy diet, increase exercise, avoid trans fats, consider a krill oil cap daily      Relevant Medications   losartan (COZAAR) 50 MG tablet   Other Relevant Orders   Lipid panel   Comprehensive metabolic panel   Insomnia   Relevant Medications   traZODone (DESYREL) 50 MG tablet   Primary hypertension - Primary    Poorly controlled will alter medications, encouraged DASH diet, minimize caffeine and obtain adequate sleep. Report concerning symptoms and follow up as directed and as needed      Relevant Medications   losartan (COZAAR) 50 MG tablet   Other Relevant Orders   Lipid panel   Comprehensive metabolic panel    Other Visit Diagnoses    Need for immunization against influenza       Relevant Orders   Flu Vaccine QUAD High Dose(Fluad) (Completed)      Follow-up: Return in about 3 weeks (around 11/19/2020), or if symptoms worsen or fail to improve, for  hypertension.  [name], DO

## 2020-10-29 NOTE — Patient Instructions (Signed)
DASH Eating Plan DASH stands for "Dietary Approaches to Stop Hypertension." The DASH eating plan is a healthy eating plan that has been shown to reduce high blood pressure (hypertension). It may also reduce your risk for type 2 diabetes, heart disease, and stroke. The DASH eating plan may also help with weight loss. What are tips for following this plan?  General guidelines  Avoid eating more than 2,300 mg (milligrams) of salt (sodium) a day. If you have hypertension, you may need to reduce your sodium intake to 1,500 mg a day.  Limit alcohol intake to no more than 1 drink a day for nonpregnant women and 2 drinks a day for men. One drink equals 12 oz of beer, 5 oz of wine, or 1 oz of hard liquor.  Work with your health care provider to maintain a healthy body weight or to lose weight. Ask what an ideal weight is for you.  Get at least 30 minutes of exercise that causes your heart to beat faster (aerobic exercise) most days of the week. Activities may include walking, swimming, or biking.  Work with your health care provider or diet and nutrition specialist (dietitian) to adjust your eating plan to your individual calorie needs. Reading food labels   Check food labels for the amount of sodium per serving. Choose foods with less than 5 percent of the Daily Value of sodium. Generally, foods with less than 300 mg of sodium per serving fit into this eating plan.  To find whole grains, look for the word "whole" as the first word in the ingredient list. Shopping  Buy products labeled as "low-sodium" or "no salt added."  Buy fresh foods. Avoid canned foods and premade or frozen meals. Cooking  Avoid adding salt when cooking. Use salt-free seasonings or herbs instead of table salt or sea salt. Check with your health care provider or pharmacist before using salt substitutes.  Do not fry foods. Cook foods using healthy methods such as baking, boiling, grilling, and broiling instead.  Cook with  heart-healthy oils, such as olive, canola, soybean, or sunflower oil. Meal planning  Eat a balanced diet that includes: ? 5 or more servings of fruits and vegetables each day. At each meal, try to fill half of your plate with fruits and vegetables. ? Up to 6-8 servings of whole grains each day. ? Less than 6 oz of lean meat, poultry, or fish each day. A 3-oz serving of meat is about the same size as a deck of cards. One egg equals 1 oz. ? 2 servings of low-fat dairy each day. ? A serving of nuts, seeds, or beans 5 times each week. ? Heart-healthy fats. Healthy fats called Omega-3 fatty acids are found in foods such as flaxseeds and coldwater fish, like sardines, salmon, and mackerel.  Limit how much you eat of the following: ? Canned or prepackaged foods. ? Food that is high in trans fat, such as fried foods. ? Food that is high in saturated fat, such as fatty meat. ? Sweets, desserts, sugary drinks, and other foods with added sugar. ? Full-fat dairy products.  Do not salt foods before eating.  Try to eat at least 2 vegetarian meals each week.  Eat more home-cooked food and less restaurant, buffet, and fast food.  When eating at a restaurant, ask that your food be prepared with less salt or no salt, if possible. What foods are recommended? The items listed may not be a complete list. Talk with your dietitian about   what dietary choices are best for you. Grains Whole-grain or whole-wheat bread. Whole-grain or whole-wheat pasta. Brown rice. Oatmeal. Quinoa. Bulgur. Whole-grain and low-sodium cereals. Pita bread. Low-fat, low-sodium crackers. Whole-wheat flour tortillas. Vegetables Fresh or frozen vegetables (raw, steamed, roasted, or grilled). Low-sodium or reduced-sodium tomato and vegetable juice. Low-sodium or reduced-sodium tomato sauce and tomato paste. Low-sodium or reduced-sodium canned vegetables. Fruits All fresh, dried, or frozen fruit. Canned fruit in natural juice (without  added sugar). Meat and other protein foods Skinless chicken or turkey. Ground chicken or turkey. Pork with fat trimmed off. Fish and seafood. Egg whites. Dried beans, peas, or lentils. Unsalted nuts, nut butters, and seeds. Unsalted canned beans. Lean cuts of beef with fat trimmed off. Low-sodium, lean deli meat. Dairy Low-fat (1%) or fat-free (skim) milk. Fat-free, low-fat, or reduced-fat cheeses. Nonfat, low-sodium ricotta or cottage cheese. Low-fat or nonfat yogurt. Low-fat, low-sodium cheese. Fats and oils Soft margarine without trans fats. Vegetable oil. Low-fat, reduced-fat, or light mayonnaise and salad dressings (reduced-sodium). Canola, safflower, olive, soybean, and sunflower oils. Avocado. Seasoning and other foods Herbs. Spices. Seasoning mixes without salt. Unsalted popcorn and pretzels. Fat-free sweets. What foods are not recommended? The items listed may not be a complete list. Talk with your dietitian about what dietary choices are best for you. Grains Baked goods made with fat, such as croissants, muffins, or some breads. Dry pasta or rice meal packs. Vegetables Creamed or fried vegetables. Vegetables in a cheese sauce. Regular canned vegetables (not low-sodium or reduced-sodium). Regular canned tomato sauce and paste (not low-sodium or reduced-sodium). Regular tomato and vegetable juice (not low-sodium or reduced-sodium). Pickles. Olives. Fruits Canned fruit in a light or heavy syrup. Fried fruit. Fruit in cream or butter sauce. Meat and other protein foods Fatty cuts of meat. Ribs. Fried meat. Bacon. Sausage. Bologna and other processed lunch meats. Salami. Fatback. Hotdogs. Bratwurst. Salted nuts and seeds. Canned beans with added salt. Canned or smoked fish. Whole eggs or egg yolks. Chicken or turkey with skin. Dairy Whole or 2% milk, cream, and half-and-half. Whole or full-fat cream cheese. Whole-fat or sweetened yogurt. Full-fat cheese. Nondairy creamers. Whipped toppings.  Processed cheese and cheese spreads. Fats and oils Butter. Stick margarine. Lard. Shortening. Ghee. Bacon fat. Tropical oils, such as coconut, palm kernel, or palm oil. Seasoning and other foods Salted popcorn and pretzels. Onion salt, garlic salt, seasoned salt, table salt, and sea salt. Worcestershire sauce. Tartar sauce. Barbecue sauce. Teriyaki sauce. Soy sauce, including reduced-sodium. Steak sauce. Canned and packaged gravies. Fish sauce. Oyster sauce. Cocktail sauce. Horseradish that you find on the shelf. Ketchup. Mustard. Meat flavorings and tenderizers. Bouillon cubes. Hot sauce and Tabasco sauce. Premade or packaged marinades. Premade or packaged taco seasonings. Relishes. Regular salad dressings. Where to find more information:  National Heart, Lung, and Blood Institute: www.nhlbi.nih.gov  American Heart Association: www.heart.org Summary  The DASH eating plan is a healthy eating plan that has been shown to reduce high blood pressure (hypertension). It may also reduce your risk for type 2 diabetes, heart disease, and stroke.  With the DASH eating plan, you should limit salt (sodium) intake to 2,300 mg a day. If you have hypertension, you may need to reduce your sodium intake to 1,500 mg a day.  When on the DASH eating plan, aim to eat more fresh fruits and vegetables, whole grains, lean proteins, low-fat dairy, and heart-healthy fats.  Work with your health care provider or diet and nutrition specialist (dietitian) to adjust your eating plan to your   individual calorie needs. This information is not intended to replace advice given to you by your health care provider. Make sure you discuss any questions you have with your health care provider. Document Revised: 11/06/2017 Document Reviewed: 11/17/2016 Elsevier Patient Education  2020 Elsevier Inc.  

## 2020-10-30 ENCOUNTER — Telehealth: Payer: Self-pay | Admitting: Family Medicine

## 2020-10-30 NOTE — Progress Notes (Signed)
  Chronic Care Management   Outreach Note  10/30/2020 Name: [name] Gibson MRN: [id number] DOB: [birth year]  Referred by: [name], DO Reason for referral : No chief complaint on file.   A second unsuccessful telephone outreach was attempted today. The patient was referred to pharmacist for assistance with care management and care coordination.  Follow Up Plan:   Carley Perdue [employer] Scheduler

## 2020-10-31 ENCOUNTER — Other Ambulatory Visit: Payer: Self-pay | Admitting: Family Medicine

## 2020-10-31 ENCOUNTER — Other Ambulatory Visit: Payer: Self-pay

## 2020-10-31 DIAGNOSIS — E785 Hyperlipidemia, unspecified: Secondary | ICD-10-CM

## 2020-10-31 MED ORDER — ATORVASTATIN CALCIUM 40 MG PO TABS: 40.0000 mg | ORAL_TABLET | Freq: Every day | ORAL | 2 refills | Status: DC

## 2020-11-05 ENCOUNTER — Telehealth: Payer: Self-pay | Admitting: Family Medicine

## 2020-11-05 NOTE — Progress Notes (Signed)
  Chronic Care Management   Outreach Note  11/05/2020 Name: [name] Gibson MRN: [id number] DOB: Nov 26, 1941  Referred by: [name], DO Reason for referral : No chief complaint on file.   A second unsuccessful telephone outreach was attempted today. The patient was referred to pharmacist for assistance with care management and care coordination.  Follow Up Plan:   Carley Perdue [employer] Scheduler

## 2020-11-06 ENCOUNTER — Telehealth: Payer: Self-pay | Admitting: Family Medicine

## 2020-11-06 NOTE — Progress Notes (Signed)
  Chronic Care Management   Outreach Note  11/06/2020 Name: [name] Gibson MRN: [id number] DOB: 1941/10/01  Referred by: [name], DO Reason for referral : No chief complaint on file.   Third unsuccessful telephone outreach was attempted today. The patient was referred to the pharmacist for assistance with care management and care coordination.   Follow Up Plan:   Carley Perdue [employer] Scheduler

## 2020-11-09 ENCOUNTER — Other Ambulatory Visit (HOSPITAL_BASED_OUTPATIENT_CLINIC_OR_DEPARTMENT_OTHER): Payer: Self-pay | Admitting: Internal Medicine

## 2020-11-09 ENCOUNTER — Ambulatory Visit: Payer: Medicare Other | Attending: Internal Medicine

## 2020-11-09 DIAGNOSIS — Z23 Encounter for immunization: Secondary | ICD-10-CM

## 2020-11-09 NOTE — Progress Notes (Signed)
   Covid-19 Vaccination Clinic  Name:  [name] Gibson    MRN: [id number] DOB: 01/28/1941  11/09/2020  [name] was observed post Covid-19 immunization for 15 minutes without incident. He was provided with Vaccine Information Sheet and instruction to access the V-Safe system.   [name] was instructed to call 911 with any severe reactions post vaccine: Marland Kitchen Difficulty breathing  . Swelling of face and throat  . A fast heartbeat  . A bad rash all over body  . Dizziness and weakness   Immunizations Administered    Name Date Dose VIS Date Route   Pfizer COVID-19 Vaccine 11/09/2020 12:49 PM 0.3 mL 09/26/2020 Intramuscular   Manufacturer: [place]   Lot: [id number]   [place]: [address]-4

## 2020-11-13 MED FILL — PFIZER-BIONTECH COVID-19 VA: 30 | 1 days supply | Qty: 0 | Fill #0

## 2020-11-15 ENCOUNTER — Other Ambulatory Visit: Payer: Self-pay | Admitting: Family Medicine

## 2020-11-20 ENCOUNTER — Ambulatory Visit (INDEPENDENT_AMBULATORY_CARE_PROVIDER_SITE_OTHER): Payer: Medicare Other | Admitting: Family Medicine

## 2020-11-20 ENCOUNTER — Other Ambulatory Visit: Payer: Self-pay

## 2020-11-20 ENCOUNTER — Other Ambulatory Visit: Payer: Self-pay | Admitting: *Deleted

## 2020-11-20 VITALS — BP 152/80 | HR 68

## 2020-11-20 DIAGNOSIS — I10 Essential (primary) hypertension: Secondary | ICD-10-CM | POA: Diagnosis not present

## 2020-11-20 MED ORDER — AMLODIPINE BESYLATE 10 MG PO TABS
10.0000 mg | ORAL_TABLET | Freq: Every day | ORAL | 1 refills | Status: DC
Start: 2020-11-20 — End: 2021-07-10

## 2020-11-20 MED ORDER — HYDROCHLOROTHIAZIDE 12.5 MG PO CAPS
12.5000 mg | ORAL_CAPSULE | Freq: Every day | ORAL | 1 refills | Status: DC
Start: 2020-11-20 — End: 2021-01-28

## 2020-11-20 MED ORDER — METOPROLOL SUCCINATE ER 200 MG PO TB24
200.0000 mg | ORAL_TABLET | Freq: Every day | ORAL | 1 refills | Status: DC
Start: 2020-11-20 — End: 2021-04-11

## 2020-11-20 NOTE — Progress Notes (Signed)
Pt here for Blood pressure check per Dr. Etter Sjogren  Pt currently takes: hctz 12.5 mg daily, metoprolol 200 mg daily, losartan 50 mg  Patient is suppose to be on amlodipine 10 mg daily  Patient states he has not been taking amlodipine yet. When he went to pick it up the pharmacy told him it wasn't ready and told him to check on Wednesday (11/21/20) to come in-it seems like they were waiting on a shipment.    Pt reports compliance with medication.  BP today @ = 152/80 HR = 68    BP Readings from Last 3 Encounters:  10/29/20 (!) 160/108  03/13/20 140/80  09/13/19 140/80    Please advise on BP, will call patient with instructions.

## 2020-11-20 NOTE — Progress Notes (Signed)
He needs to take the amlodapine-- does he need [place] to send it somewhere else F/u 1 month--in office

## 2020-12-27 ENCOUNTER — Encounter: Payer: Self-pay | Admitting: Family Medicine

## 2020-12-27 ENCOUNTER — Other Ambulatory Visit: Payer: Self-pay

## 2020-12-27 ENCOUNTER — Ambulatory Visit (INDEPENDENT_AMBULATORY_CARE_PROVIDER_SITE_OTHER): Payer: Medicare Other | Admitting: Family Medicine

## 2020-12-27 DIAGNOSIS — I10 Essential (primary) hypertension: Secondary | ICD-10-CM

## 2020-12-27 MED ORDER — LOSARTAN POTASSIUM 50 MG PO TABS
50.0000 mg | ORAL_TABLET | Freq: Every day | ORAL | 2 refills | Status: DC
Start: 2020-12-27 — End: 2020-12-27

## 2020-12-27 MED ORDER — LOSARTAN POTASSIUM 100 MG PO TABS: 100.0000 mg | ORAL_TABLET | Freq: Every day | ORAL | 3 refills | Status: DC

## 2020-12-27 NOTE — Patient Instructions (Signed)

## 2020-12-27 NOTE — Progress Notes (Signed)
Patient ID: [name] Gibson, male    DOB: 28-Nov-1941  Age: 80 y.o. MRN: [id number]    Subjective:  Subjective  HPI [name] Gibson presents for f/u bp    Its been running high at home too.   No symptoms -- no ha, no cp, no sob.    Review of Systems  Constitutional: Negative for appetite change, diaphoresis, fatigue and unexpected weight change.  Eyes: Negative for pain, redness and visual disturbance.  Respiratory: Negative for cough, chest tightness, shortness of breath and wheezing.   Cardiovascular: Negative for chest pain, palpitations and leg swelling.  Endocrine: Negative for cold intolerance, heat intolerance, polydipsia, polyphagia and polyuria.  Genitourinary: Negative for difficulty urinating, dysuria and frequency.  Neurological: Negative for dizziness, light-headedness, numbness and headaches.    History Past Medical History:  Diagnosis Date  . ANGIOEDEMA 08/25/2007   Qualifier: Diagnosis of  By: Jerold Coombe    . ANKLE PAIN, RIGHT 11/16/2008   Qualifier: Diagnosis of  By: Jerold Coombe    . Cancer [facility])    prostate  . COLONIC POLYPS 10/22/2007   Qualifier: Diagnosis of  By: Jerold Coombe    . DIVERTICULOSIS OF COLON 10/22/2007   Qualifier: Diagnosis of  By: Jerold Coombe    . GLAUCOMA 03/23/2007   Qualifier: Diagnosis of  By: Jerold Coombe    . GLUCOSE INTOLERANCE, HX OF 03/23/2007   Qualifier: Diagnosis of  By: Jerold Coombe    . HYPERLIPIDEMIA 03/23/2007   Qualifier: Diagnosis of  By: Jerold Coombe    . HYPERTENSION 09/08/2007   Qualifier: Diagnosis of  By: Jerold Coombe    . HYPERTHYROIDISM 03/23/2007   Qualifier: Diagnosis of  By: Jerold Coombe  - no med  . Obesity (BMI 30-39.9) 12/07/2013  . PROSTATE CANCER, HX OF 10/02/2010   Qualifier: Diagnosis of  By: Jerold Coombe    . Right shoulder pain 02/08/2015  . Sebaceous cyst 12/13/2008   Qualifier: Diagnosis of  By: Jerold Coombe      He has a past surgical history that includes  Appendectomy and radioactive seed implation.   His family history includes Brain cancer in his brother; Breast cancer in his sister; Cancer in his father; Colon cancer in his maternal grandmother; Coronary artery disease in an other family member; Diabetes in an other family member; Diabetes Mellitus II in his brother; Hyperlipidemia in an other family member; Hypertension in an other family member; Kidney failure in his brother; Lung cancer in his brother; Stomach cancer in his sister; Stroke in his brother, brother, and sister; Throat cancer in his brother.He reports that he has never smoked. He has never used smokeless tobacco. He reports current alcohol use of about 1.0 standard drink of alcohol per week. He reports that he does not use drugs.  Current Outpatient Medications on File Prior to Visit  Medication Sig Dispense Refill  . amLODipine (NORVASC) 10 MG tablet Take 1 tablet (10 mg total) by mouth daily. 90 tablet 1  . aspirin EC 325 MG tablet Take 325 mg by mouth every other day.    Marland Kitchen atorvastatin (LIPITOR) 40 MG tablet Take 1 tablet (40 mg total) by mouth daily. 30 tablet 2  . fluticasone (FLONASE) 50 MCG/ACT nasal spray Place 2 sprays into both nostrils daily as needed for allergies. 16 g 5  . hydrochlorothiazide (MICROZIDE) 12.5 MG capsule Take 1 capsule (12.5 mg total) by mouth daily. 90 capsule 1  . LUMIGAN  0.01 % SOLN Place 1 drop into both eyes at bedtime.   4  . metoprolol (TOPROL-XL) 200 MG 24 hr tablet Take 1 tablet (200 mg total) by mouth daily. 90 tablet 1  . Omega-3 Fatty Acids (FISH OIL) 1360 MG CAPS Take 1 capsule (1,360 mg total) by mouth daily. 30 capsule   . RHOPRESSA 0.02 % SOLN Place 1 drop into both eyes daily.     Marland Kitchen tiZANidine (ZANAFLEX) 4 MG tablet Take 1 tablet (4 mg total) by mouth every 6 (six) hours as needed for muscle spasms. 30 tablet 0  . traZODone (DESYREL) 50 MG tablet Take 0.5-1 tablets (25-50 mg total) by mouth at bedtime as needed for sleep. 30 tablet 3    No current facility-administered medications on file prior to visit.                                                                   Objective:  Objective  Physical Exam Vitals and nursing note reviewed.  Constitutional:      General: He is sleeping. Vital signs are normal.     Appearance: He is well-developed and well-nourished.  HENT:     Head: Normocephalic and atraumatic.     Mouth/Throat:     Mouth: Oropharynx is clear and moist.  Eyes:     Extraocular Movements: EOM normal.     Pupils: Pupils are equal, round, and reactive to light.  Neck:     Thyroid: No thyromegaly.  Cardiovascular:     Rate and Rhythm: Normal rate and regular rhythm.     Heart sounds: No murmur heard.   Pulmonary:     Effort: Pulmonary effort is normal. No respiratory distress.     Breath sounds: Normal breath sounds. No wheezing or rales.  Chest:     Chest wall: No tenderness.  Musculoskeletal:        General: No tenderness or edema.     Cervical back: Normal range of motion and neck supple.  Skin:    General: Skin is warm and dry.  Neurological:     Mental Status: He is oriented to person, place, and time.  Psychiatric:        Mood and Affect: Mood and affect normal.        Behavior: Behavior normal.        Thought Content: Thought content normal.        Judgment: Judgment normal.    BP 140/82 (BP Location: Right Arm, Patient Position: Sitting)   Pulse 64   Temp 97.9 F (36.6 C) (Oral)   Resp 18   Ht 5' 5.5" (1.664 m)   Wt 181 lb 3.2 oz (82.2 kg)   SpO2 96%   BMI 29.69 kg/m  Wt Readings from Last 3 Encounters:  12/27/20 181 lb 3.2 oz (82.2 kg)  10/29/20 183 lb 9.6 oz (83.3 kg)  03/13/20 183 lb (83 kg)     Lab Results  Component Value Date   WBC 5.1 07/05/2019   HGB 14.2 07/05/2019   HCT 42.5 07/05/2019   PLT 174.0 07/05/2019   GLUCOSE 97 10/29/2020   CHOL 204 (H) 10/29/2020   TRIG 52.0 10/29/2020   HDL  65.70 10/29/2020   LDLDIRECT 173.4 03/16/2013   [place]  128 (H) 10/29/2020   ALT 29 10/29/2020   AST 26 10/29/2020   NA 137 10/29/2020   K 4.0 10/29/2020   CL 98 10/29/2020   CREATININE 1.04 10/29/2020   BUN 19 10/29/2020   CO2 31 10/29/2020   TSH 3.22 03/13/2020   PSA 0.02 (L) 09/13/2019   INR 1.03 12/12/2009   HGBA1C 6.0 10/03/2016   MICROALBUR 0.6 12/14/2014    CT Head Wo Contrast  Result Date: 09/10/2018 CLINICAL DATA:  Fall, head injury and facial laceration. EXAM: CT HEAD WITHOUT CONTRAST CT MAXILLOFACIAL WITHOUT CONTRAST TECHNIQUE: Multidetector CT imaging of the head and maxillofacial structures were performed using the standard protocol without intravenous contrast. Multiplanar CT image reconstructions of the maxillofacial structures were also generated. COMPARISON:  MRI head February 14, 2016 FINDINGS: CT HEAD FINDINGS BRAIN: No intraparenchymal hemorrhage, mass effect nor midline shift. Mild disproportionate bifrontal volume loss with ex vacuo dilatation frontal horns, no hydrocephalus. Patchy supratentorial white matter hypodensities within normal range for patient's age, though non-specific are most compatible with chronic small vessel ischemic disease. No acute large vascular territory infarcts. No abnormal extra-axial fluid collections. Basal cisterns are patent. VASCULAR: Moderate calcific atherosclerosis of the carotid siphons. SKULL: No skull fracture. Large RIGHT frontal periorbital scalp hematoma without subcutaneous gas or radiopaque foreign bodies. OTHER: None. CT MAXILLOFACIAL FINDINGS OSSEOUS: No acute facial fracture. The mandible is intact, the condyles are located. No destructive bony lesions. 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 ORBITS: Ocular globes and orbital contents are normal. SINUSES: Paranasal sinuses are well aerated. Intact nasal septum is midline. Mastoid aircells are well aerated. SOFT TISSUES: Large RIGHT frontal to  periorbital scalp hematoma without subcutaneous gas or radiopaque foreign bodies. IMPRESSION: CT HEAD: 1. No acute intracranial process. 2. Stable mild parenchymal brain volume loss. CT maxillofacial: 1. No acute facial fracture. 2. Large RIGHT frontal to periorbital scalp hematoma. No postseptal involvement. Electronically Signed   By: Elon Alas M.D.   On: 09/10/2018 19:40   DG Knee Complete 4 Views Right  Result Date: 09/10/2018 CLINICAL DATA:  Knee pain and laceration after a fall today. EXAM: RIGHT KNEE - COMPLETE 4+ VIEW COMPARISON:  None. FINDINGS: No evidence of fracture, dislocation, or joint effusion. No evidence of arthropathy or other focal bone abnormality. Soft tissues are unremarkable. IMPRESSION: Negative. Electronically Signed   By: Misty Stanley M.D.   On: 09/10/2018 18:24   CT Maxillofacial Wo Contrast  Result Date: 09/10/2018 CLINICAL DATA:  Fall, head injury and facial laceration. EXAM: CT HEAD WITHOUT CONTRAST CT MAXILLOFACIAL WITHOUT CONTRAST TECHNIQUE: Multidetector CT imaging of the head and maxillofacial structures were performed using the standard protocol without intravenous contrast. Multiplanar CT image reconstructions of the maxillofacial structures were also generated. COMPARISON:  MRI head February 14, 2016 FINDINGS: CT HEAD FINDINGS BRAIN: No intraparenchymal hemorrhage, mass effect nor midline shift. Mild disproportionate bifrontal volume loss with ex vacuo dilatation frontal horns, no hydrocephalus. Patchy supratentorial white matter hypodensities within normal range for patient's age, though non-specific are most compatible with chronic small vessel ischemic disease. No acute large vascular territory infarcts. No abnormal extra-axial fluid collections. Basal cisterns are patent. VASCULAR: Moderate calcific atherosclerosis of the carotid siphons. SKULL: No skull fracture. Large RIGHT frontal periorbital scalp hematoma without subcutaneous gas or radiopaque foreign  bodies. OTHER: None. CT MAXILLOFACIAL FINDINGS OSSEOUS: No acute facial fracture. The mandible is intact, the condyles are located. No destructive bony lesions. Severe C4-5 and C5-6 spondylosis and severe upper cervical  facet arthropathy. ORBITS: Ocular globes and orbital contents are normal. SINUSES: Paranasal sinuses are well aerated. Intact nasal septum is midline. Mastoid aircells are well aerated. SOFT TISSUES: Large RIGHT frontal to periorbital scalp hematoma without subcutaneous gas or radiopaque foreign bodies. IMPRESSION: CT HEAD: 1. No acute intracranial process. 2. Stable mild parenchymal brain volume loss. CT maxillofacial: 1. No acute facial fracture. 2. Large RIGHT frontal to periorbital scalp hematoma. No postseptal involvement. Electronically Signed   By: Elon Alas M.D.   On: 09/10/2018 19:40     Assessment & Plan:  Plan  I have discontinued Melford [name]'s losartan and losartan. I am also having him start on losartan. Additionally, I am having him maintain his Lumigan, Rhopressa, aspirin EC, Fish Oil, tiZANidine, fluticasone, traZODone, atorvastatin, amLODipine, metoprolol, and hydrochlorothiazide.  Meds ordered this encounter  Medications  . DISCONTD: losartan (COZAAR) 50 MG tablet    Sig: Take 1 tablet (50 mg total) by mouth daily.    Dispense:  30 tablet    Refill:  2  . losartan (COZAAR) 100 MG tablet    Sig: Take 1 tablet (100 mg total) by mouth daily.    Dispense:  90 tablet    Refill:  3    Problem List Items Addressed This Visit      Unprioritized   Primary hypertension   Relevant Medications   losartan (COZAAR) 100 MG tablet      Follow-up: Return in about 4 weeks (around 01/24/2021), or if symptoms worsen or fail to improve, for needs lab visit next month and nurse visit for bp check .  [name], DO

## 2020-12-27 NOTE — Assessment & Plan Note (Signed)
Poorly controlled will alter medications, encouraged DASH diet, minimize caffeine and obtain adequate sleep. Report concerning symptoms and follow up as directed and as needed Inc losartan 100 mg daily  F/u 1 month for labs and bp check

## 2021-01-03 DIAGNOSIS — H401133 Primary open-angle glaucoma, bilateral, severe stage: Secondary | ICD-10-CM | POA: Diagnosis not present

## 2021-01-28 ENCOUNTER — Encounter: Payer: Self-pay | Admitting: Family Medicine

## 2021-01-28 ENCOUNTER — Ambulatory Visit: Payer: Medicare Other | Admitting: Family Medicine

## 2021-01-28 ENCOUNTER — Ambulatory Visit (INDEPENDENT_AMBULATORY_CARE_PROVIDER_SITE_OTHER): Payer: Medicare Other | Admitting: Family Medicine

## 2021-01-28 ENCOUNTER — Other Ambulatory Visit: Payer: Self-pay

## 2021-01-28 VITALS — BP 164/90 | HR 84 | Temp 98.1°F | Resp 18 | Wt 183.2 lb

## 2021-01-28 DIAGNOSIS — E785 Hyperlipidemia, unspecified: Secondary | ICD-10-CM | POA: Diagnosis not present

## 2021-01-28 DIAGNOSIS — I10 Essential (primary) hypertension: Secondary | ICD-10-CM

## 2021-01-28 LAB — COMPREHENSIVE METABOLIC PANEL
ALT: 37 U/L (ref 0–53)
AST: 31 U/L (ref 0–37)
Albumin: 4.4 g/dL (ref 3.5–5.2)
Alkaline Phosphatase: 41 U/L (ref 39–117)
BUN: 19 mg/dL (ref 6–23)
CO2: 31 mEq/L (ref 19–32)
Calcium: 10.1 mg/dL (ref 8.4–10.5)
Chloride: 97 mEq/L (ref 96–112)
Creatinine, Ser: 1.07 mg/dL (ref 0.40–1.50)
GFR: 65.04 mL/min (ref >60.00)
Glucose, Bld: 91 mg/dL (ref 70–99)
Potassium: 4.2 mEq/L (ref 3.5–5.1)
Sodium: 135 mEq/L (ref 135–145)
Total Bilirubin: 0.5 mg/dL (ref 0.2–1.2)
Total Protein: 7.1 g/dL (ref 6.0–8.3)

## 2021-01-28 LAB — LIPID PANEL
Cholesterol: 169 mg/dL (ref 0–200)
HDL: 60.9 mg/dL (ref >39.00)
LDL Cholesterol: 98 mg/dL (ref 0–99)
NonHDL: 107.99
Total CHOL/HDL Ratio: 3
Triglycerides: 52 mg/dL (ref 0.0–149.0)
VLDL: 10.4 mg/dL (ref 0.0–40.0)

## 2021-01-28 LAB — MICROALBUMIN / CREATININE URINE RATIO
Creatinine,U: 35.1 mg/dL
Microalb Creat Ratio: 2 mg/g (ref 0.0–30.0)
Microalb, Ur: <0.7 mg/dL (ref 0.0–1.9)

## 2021-01-28 MED ORDER — CHLORTHALIDONE 25 MG PO TABS: 25.0000 mg | ORAL_TABLET | Freq: Every day | ORAL | 2 refills | Status: DC

## 2021-01-28 NOTE — Assessment & Plan Note (Signed)
Well controlled, no changes to meds. Encouraged heart healthy diet such as the DASH diet and exercise as tolerated.  D/c hctz and start chlorthalidone

## 2021-01-28 NOTE — Progress Notes (Signed)
Patient ID: [name] Gibson, male    DOB: 03/27/1941  Age: 80 y.o. MRN: [id number]    Subjective:  Subjective  HPI [name] presents for bp check and labs  No complaints      Review of Systems  Constitutional: Negative for appetite change, diaphoresis, fatigue and unexpected weight change.  Eyes: Negative for pain, redness and visual disturbance.  Respiratory: Negative for cough, chest tightness, shortness of breath and wheezing.   Cardiovascular: Negative for chest pain, palpitations and leg swelling.  Endocrine: Negative for cold intolerance, heat intolerance, polydipsia, polyphagia and polyuria.  Genitourinary: Negative for difficulty urinating, dysuria and frequency.  Neurological: Negative for dizziness, light-headedness, numbness and headaches.    History Past Medical History:  Diagnosis Date  . ANGIOEDEMA 08/25/2007   Qualifier: Diagnosis of  By: Jerold Coombe    . ANKLE PAIN, RIGHT 11/16/2008   Qualifier: Diagnosis of  By: Jerold Coombe    . Cancer [facility])    prostate  . COLONIC POLYPS 10/22/2007   Qualifier: Diagnosis of  By: Jerold Coombe    . DIVERTICULOSIS OF COLON 10/22/2007   Qualifier: Diagnosis of  By: Jerold Coombe    . GLAUCOMA 03/23/2007   Qualifier: Diagnosis of  By: Jerold Coombe    . GLUCOSE INTOLERANCE, HX OF 03/23/2007   Qualifier: Diagnosis of  By: Jerold Coombe    . HYPERLIPIDEMIA 03/23/2007   Qualifier: Diagnosis of  By: Jerold Coombe    . HYPERTENSION 09/08/2007   Qualifier: Diagnosis of  By: Jerold Coombe    . HYPERTHYROIDISM 03/23/2007   Qualifier: Diagnosis of  By: Jerold Coombe  - no med  . Obesity (BMI 30-39.9) 12/07/2013  . PROSTATE CANCER, HX OF 10/02/2010   Qualifier: Diagnosis of  By: Jerold Coombe    . Right shoulder pain 02/08/2015  . Sebaceous cyst 12/13/2008   Qualifier: Diagnosis of  By: Jerold Coombe      He has a past surgical history that includes Appendectomy and radioactive seed implation.   His  family history includes Brain cancer in his brother; Breast cancer in his sister; Cancer in his father; Colon cancer in his maternal grandmother; Coronary artery disease in an other family member; Diabetes in an other family member; Diabetes Mellitus II in his brother; Hyperlipidemia in an other family member; Hypertension in an other family member; Kidney failure in his brother; Lung cancer in his brother; Stomach cancer in his sister; Stroke in his brother, brother, and sister; Throat cancer in his brother.He reports that he has never smoked. He has never used smokeless tobacco. He reports current alcohol use of about 1.0 standard drink of alcohol per week. He reports that he does not use drugs.  Current Outpatient Medications on File Prior to Visit  Medication Sig Dispense Refill  . amLODipine (NORVASC) 10 MG tablet Take 1 tablet (10 mg total) by mouth daily. 90 tablet 1  . aspirin EC 325 MG tablet Take 325 mg by mouth every other day.    Marland Kitchen atorvastatin (LIPITOR) 40 MG tablet Take 1 tablet (40 mg total) by mouth daily. 30 tablet 2  . bimatoprost (LUMIGAN) 0.01 % SOLN Lumigan 0.01 % eye drops    . fluticasone (FLONASE) 50 MCG/ACT nasal spray Place 2 sprays into both nostrils daily as needed for allergies. 16 g 5  . losartan (COZAAR) 100 MG tablet Take 1 tablet (100 mg total) by mouth daily. 90 tablet 3  . metoprolol (  TOPROL-XL) 200 MG 24 hr tablet Take 1 tablet (200 mg total) by mouth daily. 90 tablet 1  . Omega-3 Fatty Acids (FISH OIL) 1360 MG CAPS Take 1 capsule (1,360 mg total) by mouth daily. 30 capsule   . RESTASIS 0.05 % ophthalmic emulsion     . RHOPRESSA 0.02 % SOLN Place 1 drop into both eyes daily.     Marland Kitchen tiZANidine (ZANAFLEX) 4 MG tablet Take 1 tablet (4 mg total) by mouth every 6 (six) hours as needed for muscle spasms. 30 tablet 0  . traZODone (DESYREL) 50 MG tablet Take 0.5-1 tablets (25-50 mg total) by mouth at bedtime as needed for sleep. 30 tablet 3   No current  facility-administered medications on file prior to visit.     Objective:  Objective  Physical Exam Vitals and nursing note reviewed.  Constitutional:      General: He is sleeping. Vital signs are normal.     Appearance: He is well-developed and well-nourished.  HENT:     Head: Normocephalic and atraumatic.     Mouth/Throat:     Mouth: Oropharynx is clear and moist.  Eyes:     Extraocular Movements: EOM normal.     Pupils: Pupils are equal, round, and reactive to light.  Neck:     Thyroid: No thyromegaly.  Cardiovascular:     Rate and Rhythm: Normal rate and regular rhythm.     Heart sounds: No murmur heard.   Pulmonary:     Effort: Pulmonary effort is normal. No respiratory distress.     Breath sounds: Normal breath sounds. No wheezing or rales.  Chest:     Chest wall: No tenderness.  Musculoskeletal:        General: No tenderness or edema.     Cervical back: Normal range of motion and neck supple.  Skin:    General: Skin is warm and dry.  Neurological:     Mental Status: He is oriented to person, place, and time.  Psychiatric:        Mood and Affect: Mood and affect normal.        Behavior: Behavior normal.        Thought Content: Thought content normal.        Judgment: Judgment normal.    BP (!) 164/90 (BP Location: Left Arm, Patient Position: Sitting, Cuff Size: Normal)   Pulse 84   Temp 98.1 F (36.7 C) (Oral)   Resp 18   Wt 183 lb 3.2 oz (83.1 kg)   SpO2 99%   BMI 30.02 kg/m  Wt Readings from Last 3 Encounters:  01/28/21 183 lb 3.2 oz (83.1 kg)  12/27/20 181 lb 3.2 oz (82.2 kg)  10/29/20 183 lb 9.6 oz (83.3 kg)     Lab Results  Component Value Date   WBC 5.1 07/05/2019   HGB 14.2 07/05/2019   HCT 42.5 07/05/2019   PLT 174.0 07/05/2019   GLUCOSE 97 10/29/2020   CHOL 204 (H) 10/29/2020   TRIG 52.0 10/29/2020   HDL 65.70 10/29/2020   LDLDIRECT 173.4 03/16/2013   LDLCALC 128 (H) 10/29/2020   ALT 29 10/29/2020   AST 26 10/29/2020   NA 137  10/29/2020   K 4.0 10/29/2020   CL 98 10/29/2020   CREATININE 1.04 10/29/2020   BUN 19 10/29/2020   CO2 31 10/29/2020   TSH 3.22 03/13/2020   PSA 0.02 (L) 09/13/2019   INR 1.03 12/12/2009   HGBA1C 6.0 10/03/2016   MICROALBUR 0.6 12/14/2014  CT Head Wo Contrast  Result Date: 09/10/2018 CLINICAL DATA:  Fall, head injury and facial laceration. EXAM: CT HEAD WITHOUT CONTRAST CT MAXILLOFACIAL WITHOUT CONTRAST TECHNIQUE: Multidetector CT imaging of the head and maxillofacial structures were performed using the standard protocol without intravenous contrast. Multiplanar CT image reconstructions of the maxillofacial structures were also generated. COMPARISON:  MRI head February 14, 2016 FINDINGS: CT HEAD FINDINGS BRAIN: No intraparenchymal hemorrhage, mass effect nor midline shift. Mild disproportionate bifrontal volume loss with ex vacuo dilatation frontal horns, no hydrocephalus. Patchy supratentorial white matter hypodensities within normal range for patient's age, though non-specific are most compatible with chronic small vessel ischemic disease. No acute large vascular territory infarcts. No abnormal extra-axial fluid collections. Basal cisterns are patent. VASCULAR: Moderate calcific atherosclerosis of the carotid siphons. SKULL: No skull fracture. Large RIGHT frontal periorbital scalp hematoma without subcutaneous gas or radiopaque foreign bodies. OTHER: None. CT MAXILLOFACIAL FINDINGS OSSEOUS: No acute facial fracture. The mandible is intact, the condyles are located. No destructive bony lesions. 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 ORBITS: Ocular globes and orbital contents are normal. SINUSES: Paranasal sinuses are well aerated. Intact nasal septum is midline. Mastoid aircells are well aerated. SOFT TISSUES: Large RIGHT frontal to periorbital scalp hematoma without subcutaneous gas or radiopaque foreign bodies. IMPRESSION: CT HEAD: 1. No acute intracranial  process. 2. Stable mild parenchymal brain volume loss. CT maxillofacial: 1. No acute facial fracture. 2. Large RIGHT frontal to periorbital scalp hematoma. No postseptal involvement. Electronically Signed   By: Elon Alas M.D.   On: 09/10/2018 19:40   DG Knee Complete 4 Views Right  Result Date: 09/10/2018 CLINICAL DATA:  Knee pain and laceration after a fall today. EXAM: RIGHT KNEE - COMPLETE 4+ VIEW COMPARISON:  None. FINDINGS: No evidence of fracture, dislocation, or joint effusion. No evidence of arthropathy or other focal bone abnormality. Soft tissues are unremarkable. IMPRESSION: Negative. Electronically Signed   By: Misty Stanley M.D.   On: 09/10/2018 18:24   CT Maxillofacial Wo Contrast  Result Date: 09/10/2018 CLINICAL DATA:  Fall, head injury and facial laceration. EXAM: CT HEAD WITHOUT CONTRAST CT MAXILLOFACIAL WITHOUT CONTRAST TECHNIQUE: Multidetector CT imaging of the head and maxillofacial structures were performed using the standard protocol without intravenous contrast. Multiplanar CT image reconstructions of the maxillofacial structures were also generated. COMPARISON:  MRI head February 14, 2016 FINDINGS: CT HEAD FINDINGS BRAIN: No intraparenchymal hemorrhage, mass effect nor midline shift. Mild disproportionate bifrontal volume loss with ex vacuo dilatation frontal horns, no hydrocephalus. Patchy supratentorial white matter hypodensities within normal range for patient's age, though non-specific are most compatible with chronic small vessel ischemic disease. No acute large vascular territory infarcts. No abnormal extra-axial fluid collections. Basal cisterns are patent. VASCULAR: Moderate calcific atherosclerosis of the carotid siphons. SKULL: No skull fracture. Large RIGHT frontal periorbital scalp hematoma without subcutaneous gas or radiopaque foreign bodies. OTHER: None. CT MAXILLOFACIAL FINDINGS OSSEOUS: No acute facial fracture. The mandible is intact, the condyles are located.  No destructive bony lesions. 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 ORBITS: Ocular globes and orbital contents are normal. SINUSES: Paranasal sinuses are well aerated. Intact nasal septum is midline. Mastoid aircells are well aerated. SOFT TISSUES: Large RIGHT frontal to periorbital scalp hematoma without subcutaneous gas or radiopaque foreign bodies. IMPRESSION: CT HEAD: 1. No acute intracranial process. 2. Stable mild parenchymal brain volume loss. CT maxillofacial: 1. No acute facial fracture. 2. Large RIGHT frontal to periorbital scalp hematoma.  No postseptal involvement. Electronically Signed   By: Elon Alas M.D.   On: 09/10/2018 19:40     Assessment & Plan:  Plan  I have discontinued Fleet [name]'s hydrochlorothiazide. I am also having him start on chlorthalidone. Additionally, I am having him maintain his Rhopressa, aspirin EC, Fish Oil, tiZANidine, fluticasone, traZODone, atorvastatin, amLODipine, metoprolol, losartan, Restasis, and Lumigan.  Meds ordered this encounter  Medications  . chlorthalidone (HYGROTON) 25 MG tablet    Sig: Take 1 tablet (25 mg total) by mouth daily.    Dispense:  30 tablet    Refill:  2    Problem List Items Addressed This Visit      Unprioritized   Hyperlipidemia    Tolerating statin, encouraged heart healthy diet, avoid trans fats, minimize simple carbs and saturated fats. Increase exercise as tolerated      Relevant Medications   chlorthalidone (HYGROTON) 25 MG tablet   Primary hypertension    Well controlled, no changes to meds. Encouraged heart healthy diet such as the DASH diet and exercise as tolerated.  D/c hctz and start chlorthalidone       Relevant Medications   chlorthalidone (HYGROTON) 25 MG tablet    Other Visit Diagnoses    Hypertension, unspecified type    -  Primary   Relevant Medications   chlorthalidone (HYGROTON) 25 MG tablet   Other Relevant Orders   US RENAL ARTERY DUPLEX  COMPLETE   Microalbumin / creatinine urine ratio      Follow-up: Return in about 3 weeks (around 02/18/2021), or if symptoms worsen or fail to improve, for +.  [name], DO

## 2021-01-28 NOTE — Patient Instructions (Signed)

## 2021-01-28 NOTE — Assessment & Plan Note (Signed)
Tolerating statin, encouraged heart healthy diet, avoid trans fats, minimize simple carbs and saturated fats. Increase exercise as tolerated 

## 2021-01-31 ENCOUNTER — Other Ambulatory Visit: Payer: Self-pay | Admitting: Family Medicine

## 2021-01-31 ENCOUNTER — Other Ambulatory Visit: Payer: Self-pay

## 2021-01-31 DIAGNOSIS — I10 Essential (primary) hypertension: Secondary | ICD-10-CM

## 2021-03-04 ENCOUNTER — Other Ambulatory Visit: Payer: Self-pay

## 2021-03-04 ENCOUNTER — Ambulatory Visit (HOSPITAL_BASED_OUTPATIENT_CLINIC_OR_DEPARTMENT_OTHER)
Admission: RE | Admit: 2021-03-04 | Discharge: 2021-03-04 | Disposition: A | Discharge status: Home / Self Care | Payer: Medicare Other | Source: Ambulatory Visit | Attending: Family Medicine | Admitting: Family Medicine

## 2021-03-04 DIAGNOSIS — I10 Essential (primary) hypertension: Secondary | ICD-10-CM | POA: Diagnosis not present

## 2021-04-10 ENCOUNTER — Other Ambulatory Visit: Payer: Self-pay | Admitting: Family Medicine

## 2021-04-10 DIAGNOSIS — I10 Essential (primary) hypertension: Secondary | ICD-10-CM

## 2021-04-11 ENCOUNTER — Other Ambulatory Visit: Payer: Self-pay

## 2021-04-11 MED ORDER — METOPROLOL SUCCINATE ER 200 MG PO TB24: 200.0000 mg | ORAL_TABLET | Freq: Every day | ORAL | 1 refills | Status: DC

## 2021-04-29 ENCOUNTER — Ambulatory Visit: Payer: Medicare Other | Attending: Internal Medicine

## 2021-04-29 DIAGNOSIS — Z23 Encounter for immunization: Secondary | ICD-10-CM

## 2021-04-29 NOTE — Progress Notes (Signed)
   Covid-19 Vaccination Clinic  Name:  [name] Gibson    MRN: [id number] DOB: 1940-12-23  04/29/2021  Mr. [name] was observed post Covid-19 immunization for 15 minutes without incident. He was provided with Vaccine Information Sheet and instruction to access the V-Safe system.   Mr. [name] was instructed to call 911 with any severe reactions post vaccine: Marland Kitchen Difficulty breathing  . Swelling of face and throat  . A fast heartbeat  . A bad rash all over body  . Dizziness and weakness   Immunizations Administered    Name Date Dose VIS Date Route   PFIZER Comrnaty(Gray TOP) Covid-19 Vaccine 04/29/2021 11:47 AM 0.3 mL 11/15/2020 Intramuscular   Manufacturer: [place]   Lot: [id number]   NDC: [phone]

## 2021-05-07 ENCOUNTER — Other Ambulatory Visit (HOSPITAL_BASED_OUTPATIENT_CLINIC_OR_DEPARTMENT_OTHER): Payer: Self-pay

## 2021-05-07 MED ORDER — PFIZER-BIONT COVID-19 VAC-TRIS 30 MCG/0.3ML IM SUSP
INTRAMUSCULAR | 0 refills | Status: DC
  Filled 2021-05-07: qty 0.3, 1d supply, fill #0

## 2021-05-23 DIAGNOSIS — H04123 Dry eye syndrome of bilateral lacrimal glands: Secondary | ICD-10-CM | POA: Diagnosis not present

## 2021-05-23 DIAGNOSIS — H2513 Age-related nuclear cataract, bilateral: Secondary | ICD-10-CM | POA: Diagnosis not present

## 2021-05-23 DIAGNOSIS — H47233 Glaucomatous optic atrophy, bilateral: Secondary | ICD-10-CM | POA: Diagnosis not present

## 2021-05-23 DIAGNOSIS — H401133 Primary open-angle glaucoma, bilateral, severe stage: Secondary | ICD-10-CM | POA: Diagnosis not present

## 2021-07-01 ENCOUNTER — Telehealth: Payer: Self-pay | Admitting: Family Medicine

## 2021-07-01 DIAGNOSIS — R42 Dizziness and giddiness: Secondary | ICD-10-CM

## 2021-07-01 MED ORDER — FLUTICASONE PROPIONATE 50 MCG/ACT NA SUSP: 2.0000 | Freq: Every day | NASAL | 5 refills | Status: DC | PRN

## 2021-07-01 NOTE — Telephone Encounter (Signed)
Medication: fluticasone (FLONASE) 50 MCG/ACT nasal spray [id number]    Has the patient contacted their pharmacy? Yes.   (If no, request that the patient contact the pharmacy for the refill.) (If yes, when and what did the pharmacy advise?)call your doc     Preferred Pharmacy (with phone number or street name):  [facility] [place] [address]  [place] [address]  Phone:  [phone]  Fax:  [phone]  DEA #:  [id number]    Agent: Please be advised that RX refills may take up to 3 business days. We ask that you follow-up with your pharmacy.

## 2021-07-01 NOTE — Telephone Encounter (Signed)
Refill sent.

## 2021-07-09 ENCOUNTER — Other Ambulatory Visit: Payer: Self-pay | Admitting: Family Medicine

## 2021-07-09 DIAGNOSIS — I10 Essential (primary) hypertension: Secondary | ICD-10-CM

## 2021-08-06 ENCOUNTER — Other Ambulatory Visit (HOSPITAL_BASED_OUTPATIENT_CLINIC_OR_DEPARTMENT_OTHER): Payer: Self-pay

## 2021-08-29 ENCOUNTER — Ambulatory Visit: Payer: Medicare Other | Attending: Internal Medicine

## 2021-08-29 ENCOUNTER — Other Ambulatory Visit: Payer: Self-pay

## 2021-08-29 ENCOUNTER — Ambulatory Visit: Payer: Medicare Other | Admitting: Family Medicine

## 2021-08-29 ENCOUNTER — Encounter: Payer: Self-pay | Admitting: Family Medicine

## 2021-08-29 VITALS — BP 134/65 | HR 57 | Temp 98.6°F | Resp 16 | Wt 180.0 lb

## 2021-08-29 DIAGNOSIS — Z23 Encounter for immunization: Secondary | ICD-10-CM

## 2021-08-29 DIAGNOSIS — I10 Essential (primary) hypertension: Secondary | ICD-10-CM

## 2021-08-29 DIAGNOSIS — E785 Hyperlipidemia, unspecified: Secondary | ICD-10-CM | POA: Diagnosis not present

## 2021-08-29 LAB — LIPID PANEL
Cholesterol: 182 mg/dL (ref 0–200)
HDL: 66.9 mg/dL (ref >39.00)
LDL Cholesterol: 104 mg/dL — ABNORMAL HIGH (ref 0–99)
NonHDL: 115.05
Total CHOL/HDL Ratio: 3
Triglycerides: 53 mg/dL (ref 0.0–149.0)
VLDL: 10.6 mg/dL (ref 0.0–40.0)

## 2021-08-29 LAB — COMPREHENSIVE METABOLIC PANEL
ALT: 25 U/L (ref 0–53)
AST: 25 U/L (ref 0–37)
Albumin: 4.5 g/dL (ref 3.5–5.2)
Alkaline Phosphatase: 38 U/L — ABNORMAL LOW (ref 39–117)
BUN: 18 mg/dL (ref 6–23)
CO2: 33 mEq/L — ABNORMAL HIGH (ref 19–32)
Calcium: 9.8 mg/dL (ref 8.4–10.5)
Chloride: 90 mEq/L — ABNORMAL LOW (ref 96–112)
Creatinine, Ser: 1.14 mg/dL (ref 0.40–1.50)
GFR: 60.03 mL/min (ref >60.00)
Glucose, Bld: 99 mg/dL (ref 70–99)
Potassium: 3.7 mEq/L (ref 3.5–5.1)
Sodium: 131 mEq/L — ABNORMAL LOW (ref 135–145)
Total Bilirubin: 0.8 mg/dL (ref 0.2–1.2)
Total Protein: 7.1 g/dL (ref 6.0–8.3)

## 2021-08-29 NOTE — Progress Notes (Signed)
   Covid-19 Vaccination Clinic  Name:  Estus Krakowski    MRN: [id number] DOB: [birth year]  08/29/2021  Mr. [name] was observed post Covid-19 immunization for 15 minutes without incident. He was provided with Vaccine Information Sheet and instruction to access the V-Safe system.   Mr. [name] was instructed to call 911 with any severe reactions post vaccine: Difficulty breathing  Swelling of face and throat  A fast heartbeat  A bad rash all over body  Dizziness and weakness

## 2021-08-29 NOTE — Progress Notes (Signed)
Subjective:   By signing my name below, I, [name] Gibson, attest that this documentation has been prepared under the direction and in the presence of [name] Held, DO  08/29/2021.   Patient ID: [name] Gibson, male    DOB: Mar 21, 1941, 80 y.o.   MRN: [id number]  Chief Complaint  Patient presents with   Hypertension    Here for follow up    HPI Patient is in today for office visit.   He has no acute complain today.   His blood pressure at office is stable.  BP Readings from Last 3 Encounters:  08/29/21 134/65  01/28/21 (!) 164/90  12/27/20 140/82    He is interesting to receive flu vaccine today.   Past Medical History:  Diagnosis Date   ANGIOEDEMA 08/25/2007   Qualifier: Diagnosis of  By: Henry Russel PAIN, RIGHT 11/16/2008   Qualifier: Diagnosis of  By: Lowne DO, [facility])    prostate   COLONIC POLYPS 10/22/2007   Qualifier: Diagnosis of  By: Jerold Coombe     DIVERTICULOSIS OF COLON 10/22/2007   Qualifier: Diagnosis of  By: Jerold Coombe     GLAUCOMA 03/23/2007   Qualifier: Diagnosis of  By: Etter Sjogren DO, [facility]     GLUCOSE INTOLERANCE, HX OF 03/23/2007   Qualifier: Diagnosis of  By: Lowne DO, [place] 03/23/2007   Qualifier: Diagnosis of  By: Jerold Coombe     HYPERTENSION 09/08/2007   Qualifier: Diagnosis of  By: Jerold Coombe     HYPERTHYROIDISM 03/23/2007   Qualifier: Diagnosis of  By: Jerold Coombe  - no med   Obesity (BMI 30-39.9) 12/07/2013   PROSTATE CANCER, HX OF 10/02/2010   Qualifier: Diagnosis of  By: Jerold Coombe     Right shoulder pain 02/08/2015   Sebaceous cyst 12/13/2008   Qualifier: Diagnosis of  By: Jerold Coombe      Past Surgical History:  Procedure Laterality Date   APPENDECTOMY     radioactive seed implation     prostate    Family History  Problem Relation Age of Onset   Brain cancer Brother    Diabetes Other    Hyperlipidemia Other    Hypertension Other    Coronary  artery disease Other    Cancer Father        spine   Stomach cancer Sister    Kidney failure Brother    Diabetes Mellitus II Brother    Throat cancer Brother    Lung cancer Brother    Stroke Brother    Stroke Brother    Stroke Sister    Breast cancer Sister    Colon cancer Maternal Grandmother     Social History   Socioeconomic History   Marital status: Divorced    Spouse name: Not on file   Number of children: Not on file   Years of education: Not on file   Highest education level: Not on file  Occupational History   Occupation: Salesman    Employer: [employer]  Tobacco Use   Smoking status: Never   Smokeless tobacco: Never  Substance and Sexual Activity   Alcohol use: Yes    Alcohol/week: 1.0 standard drink    Types: 1 Shots of liquor per week   Drug use: No   Sexual activity: Yes    Partners: Female  Other Topics Concern   Not  on file  Social History Narrative   Reg exercise--walking 2-3 x a week   Social Determinants of Radio broadcast assistant Strain: Not on file  Food Insecurity: Not on file  Transportation Needs: Not on file  Physical Activity: Not on file  Stress: Not on file  Social Connections: Not on file  Intimate Partner Violence: Not on file    Outpatient Medications Prior to Visit  Medication Sig Dispense Refill   amLODipine (NORVASC) 10 MG tablet TAKE 1 TABLET(10 MG) BY MOUTH DAILY 90 tablet 1   aspirin EC 325 MG tablet Take 325 mg by mouth every other day.     atorvastatin (LIPITOR) 40 MG tablet Take 1 tablet (40 mg total) by mouth daily. 30 tablet 2   bimatoprost (LUMIGAN) 0.01 % SOLN Lumigan 0.01 % eye drops     chlorthalidone (HYGROTON) 25 MG tablet TAKE 1 TABLET(25 MG) BY MOUTH DAILY 30 tablet 2   COVID-19 mRNA Vac-TriS, Pfizer, (PFIZER-BIONT COVID-19 VAC-TRIS) SUSP injection Inject into the muscle. 0.3 mL 0   COVID-19 mRNA vaccine, Pfizer, 30 MCG/0.3ML injection INJECT AS DIRECTED .3 mL 0   fluticasone (FLONASE) 50 MCG/ACT  nasal spray Place 2 sprays into both nostrils daily as needed for allergies. 16 g 5   losartan (COZAAR) 100 MG tablet Take 1 tablet (100 mg total) by mouth daily. 90 tablet 3   metoprolol (TOPROL-XL) 200 MG 24 hr tablet Take 1 tablet (200 mg total) by mouth daily. 90 tablet 1   Omega-3 Fatty Acids (FISH OIL) 1360 MG CAPS Take 1 capsule (1,360 mg total) by mouth daily. 30 capsule    RESTASIS 0.05 % ophthalmic emulsion      RHOPRESSA 0.02 % SOLN Place 1 drop into both eyes daily.      tiZANidine (ZANAFLEX) 4 MG tablet Take 1 tablet (4 mg total) by mouth every 6 (six) hours as needed for muscle spasms. 30 tablet 0   traZODone (DESYREL) 50 MG tablet Take 0.5-1 tablets (25-50 mg total) by mouth at bedtime as needed for sleep. 30 tablet 3   No facility-administered medications prior to visit.    Allergies  Allergen Reactions   Penicillins     REACTION: unspecified Has patient had a PCN reaction causing immediate rash, facial/tongue/throat swelling, SOB or lightheadedness with hypotension: N Has patient had a PCN reaction causing severe rash involving mucus membranes or skin necrosis: N Has patient had a PCN reaction that required hospitalization: N Has patient had a PCN reaction occurring within the last 10 years: N If all of the above answers are "NO", then may proceed with Cephalosporin use.     ROS     Objective:    Physical Exam Constitutional:      General: He is not in acute distress.    Appearance: Normal appearance. He is not ill-appearing.  HENT:     Head: Normocephalic and atraumatic.     Right Ear: External ear normal.     Left Ear: External ear normal.  Eyes:     Extraocular Movements: Extraocular movements intact.     Pupils: Pupils are equal, round, and reactive to light.  Cardiovascular:     Rate and Rhythm: Normal rate and regular rhythm.     Heart sounds: Normal heart sounds. No murmur heard.   No gallop.  Pulmonary:     Effort: Pulmonary effort is normal. No  respiratory distress.     Breath sounds: Normal breath sounds. No wheezing or rales.  Skin:  General: Skin is warm and dry.  Neurological:     Mental Status: He is alert and oriented to person, place, and time.  Psychiatric:        Behavior: Behavior normal.        Judgment: Judgment normal.    BP 134/65 (BP Location: Left Arm, Patient Position: Sitting, Cuff Size: Small)   Pulse (!) 57   Temp 98.6 F (37 C) (Oral)   Resp 16   Wt 180 lb (81.6 kg)   SpO2 99%   BMI 29.50 kg/m  Wt Readings from Last 3 Encounters:  08/29/21 180 lb (81.6 kg)  01/28/21 183 lb 3.2 oz (83.1 kg)  12/27/20 181 lb 3.2 oz (82.2 kg)    Diabetic Foot Exam - Simple   No data filed    Lab Results  Component Value Date   WBC 5.1 07/05/2019   HGB 14.2 07/05/2019   HCT 42.5 07/05/2019   PLT 174.0 07/05/2019   GLUCOSE 91 01/28/2021   CHOL 169 01/28/2021   TRIG 52.0 01/28/2021   HDL 60.90 01/28/2021   LDLDIRECT 173.4 03/16/2013   LDLCALC 98 01/28/2021   ALT 37 01/28/2021   AST 31 01/28/2021   NA 135 01/28/2021   K 4.2 01/28/2021   CL 97 01/28/2021   CREATININE 1.07 01/28/2021   BUN 19 01/28/2021   CO2 31 01/28/2021   TSH 3.22 03/13/2020   PSA 0.02 (L) 09/13/2019   INR 1.03 12/12/2009   HGBA1C 6.0 10/03/2016   MICROALBUR <0.7 01/28/2021    Lab Results  Component Value Date   TSH 3.22 03/13/2020   Lab Results  Component Value Date   WBC 5.1 07/05/2019   HGB 14.2 07/05/2019   HCT 42.5 07/05/2019   MCV 97.1 07/05/2019   PLT 174.0 07/05/2019   Lab Results  Component Value Date   NA 135 01/28/2021   K 4.2 01/28/2021   CO2 31 01/28/2021   GLUCOSE 91 01/28/2021   BUN 19 01/28/2021   CREATININE 1.07 01/28/2021   BILITOT 0.5 01/28/2021   ALKPHOS 41 01/28/2021   AST 31 01/28/2021   ALT 37 01/28/2021   PROT 7.1 01/28/2021   ALBUMIN 4.4 01/28/2021   CALCIUM 10.1 01/28/2021   GFR 65.04 01/28/2021   Lab Results  Component Value Date   CHOL 169 01/28/2021   Lab Results   Component Value Date   HDL 60.90 01/28/2021   Lab Results  Component Value Date   LDLCALC 98 01/28/2021   Lab Results  Component Value Date   TRIG 52.0 01/28/2021   Lab Results  Component Value Date   CHOLHDL 3 01/28/2021   Lab Results  Component Value Date   HGBA1C 6.0 10/03/2016       Assessment & Plan:   Problem List Items Addressed This Visit       Unprioritized   Hyperlipidemia    Encourage heart healthy diet such as MIND or DASH diet, increase exercise, avoid trans fats, simple carbohydrates and processed foods, consider a krill or fish or flaxseed oil cap daily.       Relevant Orders   Comprehensive metabolic panel   Lipid panel   Primary hypertension - Primary    Well controlled, no changes to meds. Encouraged heart healthy diet such as the DASH diet and exercise as tolerated.       Relevant Orders   Comprehensive metabolic panel   Lipid panel   Other Visit Diagnoses     Needs flu shot  Relevant Orders   Flu Vaccine QUAD High Dose(Fluad) (Completed)        No orders of the defined types were placed in this encounter.   IAnn Held, DO, personally preformed the services described in this documentation.  All medical record entries made by the scribe were at my direction and in my presence.  I have reviewed the chart and discharge instructions (if applicable) and agree that the record reflects my personal performance and is accurate and complete. 08/29/2021.   I,Savera Zaman,acting as a Education administrator for [employer], DO.,have documented all relevant documentation on the behalf of [name] Held, DO,as directed by  [name] Held, DO while in the presence of [name] Held, DO.    [name] Held, DO

## 2021-08-29 NOTE — Assessment & Plan Note (Signed)
Encourage heart healthy diet such as MIND or DASH diet, increase exercise, avoid trans fats, simple carbohydrates and processed foods, consider a krill or fish or flaxseed oil cap daily.  °

## 2021-08-29 NOTE — Patient Instructions (Signed)

## 2021-08-29 NOTE — Assessment & Plan Note (Signed)
Well controlled, no changes to meds. Encouraged heart healthy diet such as the DASH diet and exercise as tolerated.  °

## 2021-09-04 ENCOUNTER — Other Ambulatory Visit: Payer: Self-pay | Admitting: Family Medicine

## 2021-09-04 ENCOUNTER — Other Ambulatory Visit (HOSPITAL_BASED_OUTPATIENT_CLINIC_OR_DEPARTMENT_OTHER): Payer: Self-pay

## 2021-09-04 DIAGNOSIS — E871 Hypo-osmolality and hyponatremia: Secondary | ICD-10-CM

## 2021-09-04 MED ORDER — COVID-19MRNA BIVAL VACC PFIZER 30 MCG/0.3ML IM SUSP
INTRAMUSCULAR | 0 refills | Status: DC
  Filled 2021-09-04: qty 0.3, 1d supply, fill #0

## 2021-09-12 DIAGNOSIS — H04123 Dry eye syndrome of bilateral lacrimal glands: Secondary | ICD-10-CM | POA: Diagnosis not present

## 2021-09-12 DIAGNOSIS — H401133 Primary open-angle glaucoma, bilateral, severe stage: Secondary | ICD-10-CM | POA: Diagnosis not present

## 2021-09-12 DIAGNOSIS — H47233 Glaucomatous optic atrophy, bilateral: Secondary | ICD-10-CM | POA: Diagnosis not present

## 2021-09-12 DIAGNOSIS — H2513 Age-related nuclear cataract, bilateral: Secondary | ICD-10-CM | POA: Diagnosis not present

## 2021-09-26 ENCOUNTER — Other Ambulatory Visit: Payer: Medicare Other

## 2021-10-03 ENCOUNTER — Other Ambulatory Visit (INDEPENDENT_AMBULATORY_CARE_PROVIDER_SITE_OTHER): Payer: Medicare Other

## 2021-10-03 ENCOUNTER — Other Ambulatory Visit: Payer: Self-pay

## 2021-10-03 DIAGNOSIS — E871 Hypo-osmolality and hyponatremia: Secondary | ICD-10-CM | POA: Diagnosis not present

## 2021-10-03 LAB — BASIC METABOLIC PANEL
BUN: 23 mg/dL (ref 6–23)
CO2: 35 mEq/L — ABNORMAL HIGH (ref 19–32)
Calcium: 9.8 mg/dL (ref 8.4–10.5)
Chloride: 92 mEq/L — ABNORMAL LOW (ref 96–112)
Creatinine, Ser: 1.25 mg/dL (ref 0.40–1.50)
GFR: 53.71 mL/min — ABNORMAL LOW (ref >60.00)
Glucose, Bld: 109 mg/dL — ABNORMAL HIGH (ref 70–99)
Potassium: 3.5 mEq/L (ref 3.5–5.1)
Sodium: 133 mEq/L — ABNORMAL LOW (ref 135–145)

## 2021-10-07 ENCOUNTER — Other Ambulatory Visit: Payer: Self-pay | Admitting: Family Medicine

## 2021-10-07 DIAGNOSIS — I10 Essential (primary) hypertension: Secondary | ICD-10-CM

## 2021-10-14 ENCOUNTER — Telehealth: Payer: Self-pay | Admitting: Family Medicine

## 2021-10-14 NOTE — Telephone Encounter (Signed)
Left message for patient to call back and schedule Medicare Annual Wellness Visit (AWV) in office.   If not able to come in office, please offer to do virtually or by telephone.  Left office number and my jabber [phone].  Last AWV:08/05/2016  Please schedule at anytime with Nurse Health Advisor.

## 2021-12-26 ENCOUNTER — Other Ambulatory Visit: Payer: Self-pay | Admitting: Family Medicine

## 2021-12-26 DIAGNOSIS — I10 Essential (primary) hypertension: Secondary | ICD-10-CM

## 2021-12-26 MED ORDER — LOSARTAN POTASSIUM 100 MG PO TABS: 100.0000 mg | ORAL_TABLET | Freq: Every day | ORAL | 1 refills | Status: DC

## 2022-01-05 ENCOUNTER — Other Ambulatory Visit: Payer: Self-pay | Admitting: Family Medicine

## 2022-01-10 ENCOUNTER — Telehealth: Payer: Self-pay | Admitting: Family Medicine

## 2022-01-10 MED ORDER — METOPROLOL SUCCINATE ER 200 MG PO TB24: 200.0000 mg | ORAL_TABLET | Freq: Every day | ORAL | 1 refills | Status: DC

## 2022-01-10 NOTE — Telephone Encounter (Signed)
Refill sent.

## 2022-01-10 NOTE — Telephone Encounter (Signed)
Pt stated pharmacy has been trying to get the refill for for over a week. Please advise.   Medication: metoprolol (TOPROL-XL) 200 MG 24 hr tablet   Has the patient contacted their pharmacy? Yes.     Preferred Pharmacy: [facility] [place], [place] - [address]  [place], [place] [address]  Phone:  [phone]  Fax:  [phone]

## 2022-01-16 ENCOUNTER — Telehealth: Payer: Self-pay | Admitting: Family Medicine

## 2022-01-16 NOTE — Telephone Encounter (Signed)
Left message for patient to call back and schedule Medicare Annual Wellness Visit (AWV) in office.   If not able to come in office, please offer to do virtually or by telephone.  Left office number and my jabber [phone].  Last AWV:08/05/2016  Please schedule at anytime with Nurse Health Advisor.

## 2022-02-04 DIAGNOSIS — H401133 Primary open-angle glaucoma, bilateral, severe stage: Secondary | ICD-10-CM | POA: Diagnosis not present

## 2022-02-04 DIAGNOSIS — H2513 Age-related nuclear cataract, bilateral: Secondary | ICD-10-CM | POA: Diagnosis not present

## 2022-02-04 DIAGNOSIS — H47233 Glaucomatous optic atrophy, bilateral: Secondary | ICD-10-CM | POA: Diagnosis not present

## 2022-02-04 DIAGNOSIS — H04123 Dry eye syndrome of bilateral lacrimal glands: Secondary | ICD-10-CM | POA: Diagnosis not present

## 2022-02-10 ENCOUNTER — Telehealth: Payer: Self-pay | Admitting: Family Medicine

## 2022-02-10 NOTE — Telephone Encounter (Signed)
Medication:  ?atorvastatin (LIPITOR) 20 MG tablet  ?2. losartan (COZAAR 50 MG tablet ? ?Has the patient contacted their pharmacy? Yes.   ?Patient would like the 20 MG for Atorvastatin and the '50MG'$  for the Losartan.  ? ?Preferred Pharmacy (with phone number or street name):  ?[id number]  ?[place], [place] [address]  ?Phone:  [phone]  Fax:  [phone]  ? ?Agent: Please be advised that RX refills may take up to 3 business days. We ask that you follow-up with your pharmacy.  ?

## 2022-02-11 NOTE — Telephone Encounter (Signed)
Per patient he was to take atorvastatin '20mg'$  per last blood and for the losartan he was told to take '50mg'$ .  He does not take the losartan every day and just takes it when he needs it. Today his blood pressure was 122/79.  He has a cpe on 03/03/22.  Are you ok with sending in the lower doses until his visit? ?

## 2022-02-11 NOTE — Telephone Encounter (Signed)
Pt called. LVM to have patient call back. Pt is requesting a lower dose on medication then what is on his med list. Need confirmation  ?

## 2022-02-12 MED ORDER — ATORVASTATIN CALCIUM 20 MG PO TABS: 20.0000 mg | ORAL_TABLET | Freq: Every day | ORAL | 1 refills | Status: DC

## 2022-02-12 NOTE — Telephone Encounter (Signed)
Left detailed message about the lipitor and advised that he can called to fill losartan '100mg'$  at the pharmacy. ?

## 2022-03-03 ENCOUNTER — Ambulatory Visit (INDEPENDENT_AMBULATORY_CARE_PROVIDER_SITE_OTHER): Payer: Medicare Other | Admitting: Family Medicine

## 2022-03-03 ENCOUNTER — Encounter: Payer: Self-pay | Admitting: Family Medicine

## 2022-03-03 VITALS — BP 128/62 | HR 56 | Temp 97.9°F | Resp 16 | Ht 64.0 in | Wt 182.6 lb

## 2022-03-03 DIAGNOSIS — Z0000 Encounter for general adult medical examination without abnormal findings: Secondary | ICD-10-CM

## 2022-03-03 DIAGNOSIS — E0590 Thyrotoxicosis, unspecified without thyrotoxic crisis or storm: Secondary | ICD-10-CM | POA: Diagnosis not present

## 2022-03-03 DIAGNOSIS — E785 Hyperlipidemia, unspecified: Secondary | ICD-10-CM

## 2022-03-03 DIAGNOSIS — Z23 Encounter for immunization: Secondary | ICD-10-CM

## 2022-03-03 DIAGNOSIS — I10 Essential (primary) hypertension: Secondary | ICD-10-CM

## 2022-03-03 DIAGNOSIS — R972 Elevated prostate specific antigen [PSA]: Secondary | ICD-10-CM

## 2022-03-03 LAB — COMPREHENSIVE METABOLIC PANEL
ALT: 25 U/L (ref 0–53)
AST: 23 U/L (ref 0–37)
Albumin: 4.7 g/dL (ref 3.5–5.2)
Alkaline Phosphatase: 38 U/L — ABNORMAL LOW (ref 39–117)
BUN: 34 mg/dL — ABNORMAL HIGH (ref 6–23)
CO2: 31 mEq/L (ref 19–32)
Calcium: 10.3 mg/dL (ref 8.4–10.5)
Chloride: 95 mEq/L — ABNORMAL LOW (ref 96–112)
Creatinine, Ser: 1.19 mg/dL (ref 0.40–1.50)
GFR: 56.81 mL/min — ABNORMAL LOW (ref >60.00)
Glucose, Bld: 103 mg/dL — ABNORMAL HIGH (ref 70–99)
Potassium: 3.5 mEq/L (ref 3.5–5.1)
Sodium: 134 mEq/L — ABNORMAL LOW (ref 135–145)
Total Bilirubin: 0.5 mg/dL (ref 0.2–1.2)
Total Protein: 7.3 g/dL (ref 6.0–8.3)

## 2022-03-03 LAB — CBC WITH DIFFERENTIAL/PLATELET
Basophils Absolute: 0 10*3/uL (ref 0.0–0.1)
Basophils Relative: 0.9 % (ref 0.0–3.0)
Eosinophils Absolute: 0.1 10*3/uL (ref 0.0–0.7)
Eosinophils Relative: 1.3 % (ref 0.0–5.0)
HCT: 41.3 % (ref 39.0–52.0)
Hemoglobin: 14 g/dL (ref 13.0–17.0)
Lymphocytes Relative: 38 % (ref 12.0–46.0)
Lymphs Abs: 1.8 10*3/uL (ref 0.7–4.0)
MCHC: 33.8 g/dL (ref 30.0–36.0)
MCV: 98.1 fl (ref 78.0–100.0)
Monocytes Absolute: 0.7 10*3/uL (ref 0.1–1.0)
Monocytes Relative: 14.7 % — ABNORMAL HIGH (ref 3.0–12.0)
Neutro Abs: 2.1 10*3/uL (ref 1.4–7.7)
Neutrophils Relative %: 45.1 % (ref 43.0–77.0)
Platelets: 188 10*3/uL (ref 150.0–400.0)
RBC: 4.21 Mil/uL — ABNORMAL LOW (ref 4.22–5.81)
RDW: 14.2 % (ref 11.5–15.5)
WBC: 4.6 10*3/uL (ref 4.0–10.5)

## 2022-03-03 LAB — LIPID PANEL
Cholesterol: 160 mg/dL (ref 0–200)
HDL: 65.4 mg/dL (ref >39.00)
LDL Cholesterol: 84 mg/dL (ref 0–99)
NonHDL: 94.66
Total CHOL/HDL Ratio: 2
Triglycerides: 55 mg/dL (ref 0.0–149.0)
VLDL: 11 mg/dL (ref 0.0–40.0)

## 2022-03-03 NOTE — Assessment & Plan Note (Signed)
Well controlled, no changes to meds. Encouraged heart healthy diet such as the DASH diet and exercise as tolerated.  °

## 2022-03-03 NOTE — Progress Notes (Signed)
? ?Subjective:  ? ?By signing my name below, I, [name] Gibson, attest that this documentation has been prepared under the direction and in the presence of [name] Gibson R DO 03/03/2022   ? ? Patient ID: [name] Gibson, male    DOB: 30-Dec-1940, 81 y.o.   MRN: [id number] ? ?Chief Complaint  ?Patient presents with  ? Annual Exam  ?  Here for Annual Exam   ? ? ?HPI ?Patient is in today for a comprehensive physical exam.  ? ?Patient complains of pain in his left jaw when he chews. He denies of grinding his teeth during the night.  ? ?His blood pressure reading on 02/28/2022 was 127/72. As of today's visit, his blood pressure reading is good.  ?BP Readings from Last 3 Encounters:  ?03/03/22 128/62  ?08/29/21 134/65  ?01/28/21 (!) 164/90  ? ?His sodium levels are low but within range.  ? ?He does not see his Urologist. ? ?He denies having any fever, new muscle pain, joint pain , new moles, congestion, sinus pain, sore throat, chest pain, palpations, cough, SOB ,wheezing,n/v/d constipation, blood in stool, dysuria, frequency, hematuria, depression, anxiety, headaches at this time ? ?His colonoscopy was last completed on 10/22/2021 ?His PSA was last completed on 09/13/2019 ?He is UTD on his dental exams.  ?He is UTD on vision exams.  ? ? ?Past Medical History:  ?Diagnosis Date  ? ANGIOEDEMA 08/25/2007  ? Qualifier: Diagnosis of  By: Jerold Coombe    ? ANKLE PAIN, RIGHT 11/16/2008  ? Qualifier: Diagnosis of  By: Jerold Coombe    ? Cancer [facility])   ? prostate  ? COLONIC POLYPS 10/22/2007  ? Qualifier: Diagnosis of  By: Jerold Coombe    ? DIVERTICULOSIS OF COLON 10/22/2007  ? Qualifier: Diagnosis of  By: Jerold Coombe    ? GLAUCOMA 03/23/2007  ? Qualifier: Diagnosis of  By: Jerold Coombe    ? GLUCOSE INTOLERANCE, HX OF 03/23/2007  ? Qualifier: Diagnosis of  By: Jerold Coombe    ? HYPERLIPIDEMIA 03/23/2007  ? Qualifier: Diagnosis of  By: Jerold Coombe    ? HYPERTENSION 09/08/2007  ? Qualifier: Diagnosis of  By:  Jerold Coombe    ? HYPERTHYROIDISM 03/23/2007  ? Qualifier: Diagnosis of  By: Jerold Coombe  - no med  ? Obesity (BMI 30-39.9) 12/07/2013  ? PROSTATE CANCER, HX OF 10/02/2010  ? Qualifier: Diagnosis of  By: Jerold Coombe    ? Right shoulder pain 02/08/2015  ? Sebaceous cyst 12/13/2008  ? Qualifier: Diagnosis of  By: Jerold Coombe    ? ? ?Past Surgical History:  ?Procedure Laterality Date  ? APPENDECTOMY    ? radioactive seed implation    ? prostate  ? ? ?Family History  ?Problem Relation Age of Onset  ? Brain cancer Brother   ? Diabetes Other   ? Hyperlipidemia Other   ? Hypertension Other   ? Coronary artery disease Other   ? Cancer Father   ?     spine  ? Stomach cancer Sister   ? Kidney failure Brother   ? Diabetes Mellitus II Brother   ? Throat cancer Brother   ? Lung cancer Brother   ? Stroke Brother   ? Stroke Brother   ? Stroke Sister   ? Breast cancer Sister   ? Colon cancer Maternal Grandmother   ? ? ?Social History  ? ?Socioeconomic History  ? Marital status: Divorced  ?  Spouse name: Not on file  ? Number of children: Not on file  ? Years of education: Not on file  ? Highest education level: Not on file  ?Occupational History  ? Occupation: Salesman  ?  Employer: [employer]  ?Tobacco Use  ? Smoking status: Never  ? Smokeless tobacco: Never  ?Substance and Sexual Activity  ? Alcohol use: Yes  ?  Alcohol/week: 1.0 standard drink  ?  Types: 1 Shots of liquor per week  ? Drug use: No  ? Sexual activity: Yes  ?  Partners: Female  ?Other Topics Concern  ? Not on file  ?Social History Narrative  ? Reg exercise--walking 2-3 x a week  ? ?Social Determinants of Health  ? ?Financial Resource Strain: Not on file  ?Food Insecurity: Not on file  ?Transportation Needs: Not on file  ?Physical Activity: Not on file  ?Stress: Not on file  ?Social Connections: Not on file  ?Intimate Partner Violence: Not on file  ? ? ?Outpatient Medications Prior to Visit  ?Medication Sig Dispense Refill  ? amLODipine (NORVASC)  10 MG tablet TAKE 1 TABLET(10 MG) BY MOUTH DAILY 90 tablet 1  ? aspirin EC 325 MG tablet Take 325 mg by mouth every other day.    ? atorvastatin (LIPITOR) 20 MG tablet Take 1 tablet (20 mg total) by mouth daily. 90 tablet 1  ? bimatoprost (LUMIGAN) 0.01 % SOLN Lumigan 0.01 % eye drops    ? chlorthalidone (HYGROTON) 25 MG tablet TAKE 1 TABLET(25 MG) BY MOUTH DAILY 90 tablet 1  ? COVID-19 mRNA bivalent vaccine, Pfizer, injection Inject into the muscle. 0.3 mL 0  ? COVID-19 mRNA Vac-TriS, Pfizer, (PFIZER-BIONT COVID-19 VAC-TRIS) SUSP injection Inject into the muscle. 0.3 mL 0  ? fluticasone (FLONASE) 50 MCG/ACT nasal spray Place 2 sprays into both nostrils daily as needed for allergies. 16 g 5  ? losartan (COZAAR) 100 MG tablet Take 1 tablet (100 mg total) by mouth daily. 90 tablet 1  ? metoprolol (TOPROL-XL) 200 MG 24 hr tablet Take 1 tablet (200 mg total) by mouth daily. 90 tablet 1  ? Omega-3 Fatty Acids (FISH OIL) 1360 MG CAPS Take 1 capsule (1,360 mg total) by mouth daily. 30 capsule   ? RESTASIS 0.05 % ophthalmic emulsion     ? RHOPRESSA 0.02 % SOLN Place 1 drop into both eyes daily.     ? tiZANidine (ZANAFLEX) 4 MG tablet Take 1 tablet (4 mg total) by mouth every 6 (six) hours as needed for muscle spasms. 30 tablet 0  ? traZODone (DESYREL) 50 MG tablet Take 0.5-1 tablets (25-50 mg total) by mouth at bedtime as needed for sleep. 30 tablet 3  ? ?No facility-administered medications prior to visit.  ? ? ?Allergies  ?Allergen Reactions  ? Penicillins   ?  REACTION: unspecified ?Has patient had a PCN reaction causing immediate rash, facial/tongue/throat swelling, SOB or lightheadedness with hypotension: N ?Has patient had a PCN reaction causing severe rash involving mucus membranes or skin necrosis: N ?Has patient had a PCN reaction that required hospitalization: N ?Has patient had a PCN reaction occurring within the last 10 years: N ?If all of the above answers are "NO", then may proceed with Cephalosporin use. ?   ? ? ?Review of Systems  ?Constitutional:  Negative for fever.  ?HENT:  Negative for congestion, sinus pain and sore throat.   ?     (+) Pain in left jaw when chewing ?  ?Respiratory:  Negative for cough,  shortness of breath and wheezing.   ?Cardiovascular:  Negative for chest pain and palpitations.  ?Gastrointestinal:  Negative for blood in stool, constipation, diarrhea, nausea and vomiting.  ?Genitourinary:  Negative for dysuria, frequency and hematuria.  ?Musculoskeletal:  Negative for joint pain and myalgias.  ?Skin:   ?     (-) [place]  ?Neurological:  Negative for headaches.  ?Psychiatric/Behavioral:  Negative for depression. The patient is not nervous/anxious.   ? ?   ?Objective:  ?  ?Physical Exam ?Constitutional:   ?   General: He is not in acute distress. ?   Appearance: Normal appearance. He is not ill-appearing.  ?HENT:  ?   Head: Normocephalic and atraumatic.  ?   Right Ear: Tympanic membrane, ear canal and external ear normal.  ?   Left Ear: Tympanic membrane, ear canal and external ear normal.  ?Eyes:  ?   Extraocular Movements: Extraocular movements intact.  ?   Pupils: Pupils are equal, round, and reactive to light.  ?Cardiovascular:  ?   Rate and Rhythm: Normal rate and regular rhythm.  ?   Heart sounds: Normal heart sounds. No murmur heard. ?  No gallop.  ?Pulmonary:  ?   Effort: Pulmonary effort is normal. No respiratory distress.  ?   Breath sounds: Normal breath sounds. No wheezing or rales.  ?Abdominal:  ?   General: Bowel sounds are normal. There is no distension.  ?   Palpations: Abdomen is soft.  ?   Tenderness: There is no abdominal tenderness. There is no guarding.  ?Skin: ?   General: Skin is warm and dry.  ?Neurological:  ?   Mental Status: He is alert and oriented to person, place, and time.  ?Psychiatric:     ?   Judgment: Judgment normal.  ? ? ?BP 128/62 (BP Location: Right Arm, Patient Position: Sitting, Cuff Size: Normal)   Pulse (!) 56   Temp 97.9 ?F (36.6 ?C) (Oral)   Resp  16   Ht '5\' 4"'$  (1.626 m)   Wt 182 lb 9.6 oz (82.8 kg)   SpO2 95%   BMI 31.34 kg/m?  ?Wt Readings from Last 3 Encounters:  ?03/03/22 182 lb 9.6 oz (82.8 kg)  ?08/29/21 180 lb (81.6 kg)  ?01/28/21 183 lb 3.2 oz

## 2022-03-03 NOTE — Assessment & Plan Note (Signed)
Tolerating statin, encouraged heart healthy diet, avoid trans fats, minimize simple carbs and saturated fats. Increase exercise as tolerated 

## 2022-03-03 NOTE — Patient Instructions (Signed)
Preventive Care 65 Years and Older, Male °Preventive care refers to lifestyle choices and visits with your health care provider that can promote health and wellness. Preventive care visits are also called wellness exams. °What can I expect for my preventive care visit? °Counseling °During your preventive care visit, your health care provider may ask about your: °Medical history, including: °Past medical problems. °Family medical history. °History of falls. °Current health, including: °Emotional well-being. °Home life and relationship well-being. °Sexual activity. °Memory and ability to understand (cognition). °Lifestyle, including: °Alcohol, nicotine or tobacco, and drug use. °Access to firearms. °Diet, exercise, and sleep habits. °Work and work environment. °Sunscreen use. °Safety issues such as seatbelt and bike helmet use. °Physical exam °Your health care provider will check your: °Height and weight. These may be used to calculate your BMI (body mass index). BMI is a measurement that tells if you are at a healthy weight. °Waist circumference. This measures the distance around your waistline. This measurement also tells if you are at a healthy weight and may help predict your risk of certain diseases, such as type 2 diabetes and high blood pressure. °Heart rate and blood pressure. °Body temperature. °Skin for abnormal spots. °What immunizations do I need? °Vaccines are usually given at various ages, according to a schedule. Your health care provider will recommend vaccines for you based on your age, medical history, and lifestyle or other factors, such as travel or where you work. °What tests do I need? °Screening °Your health care provider may recommend screening tests for certain conditions. This may include: °Lipid and cholesterol levels. °Diabetes screening. This is done by checking your blood sugar (glucose) after you have not eaten for a while (fasting). °Hepatitis C test. °Hepatitis B test. °HIV (human  immunodeficiency virus) test. °STI (sexually transmitted infection) testing, if you are at risk. °Lung cancer screening. °Colorectal cancer screening. °Prostate cancer screening. °Abdominal aortic aneurysm (AAA) screening. You may need this if you are a current or former smoker. °Talk with your health care provider about your test results, treatment options, and if necessary, the need for more tests. °Follow these instructions at home: °Eating and drinking ° °Eat a diet that includes fresh fruits and vegetables, whole grains, lean protein, and low-fat dairy products. Limit your intake of foods with high amounts of sugar, saturated fats, and salt. °Take vitamin and mineral supplements as recommended by your health care provider. °Do not drink alcohol if your health care provider tells you not to drink. °If you drink alcohol: °Limit how much you have to 0-2 drinks a day. °Know how much alcohol is in your drink. In the [place] one drink equals one 12 oz bottle of beer (355 mL), one 5 oz glass of wine (148 mL), or one 1½ oz glass of hard liquor (44 mL). °Lifestyle °Brush your teeth every morning and night with fluoride toothpaste. Floss one time each day. °Exercise for at least 30 minutes 5 or more days each week. °Do not use any products that contain nicotine or tobacco. These products include cigarettes, chewing tobacco, and vaping devices, such as e-cigarettes. If you need help quitting, ask your health care provider. °Do not use drugs. °If you are sexually active, practice safe sex. Use a condom or other form of protection to prevent STIs. °Take aspirin only as told by your health care provider. Make sure that you understand how much to take and what form to take. Work with your health care provider to find out whether it is safe and   beneficial for you to take aspirin daily. °Ask your health care provider if you need to take a cholesterol-lowering medicine (statin). °Find healthy ways to manage stress, such  as: °Meditation, yoga, or listening to music. °Journaling. °Talking to a trusted person. °Spending time with friends and family. °Safety °Always wear your seat belt while driving or riding in a vehicle. °Do not drive: °If you have been drinking alcohol. Do not ride with someone who has been drinking. °When you are tired or distracted. °While texting. °If you have been using any mind-altering substances or drugs. °Wear a helmet and other protective equipment during sports activities. °If you have firearms in your house, make sure you follow all gun safety procedures. °Minimize exposure to UV radiation to reduce your risk of skin cancer. °What's next? °Visit your health care provider once a year for an annual wellness visit. °Ask your health care provider how often you should have your eyes and teeth checked. °Stay up to date on all vaccines. °This information is not intended to replace advice given to you by your health care provider. Make sure you discuss any questions you have with your health care provider. °Document Revised: 05/22/2021 Document Reviewed: 05/22/2021 °Elsevier Patient Education © 2022 [employer] ° °

## 2022-03-03 NOTE — Assessment & Plan Note (Signed)
Check tsh 

## 2022-03-04 LAB — PSA: PSA: 0 ng/mL — ABNORMAL LOW (ref 0.10–4.00)

## 2022-03-04 LAB — TSH: TSH: 2.72 u[IU]/mL (ref 0.35–5.50)

## 2022-03-31 ENCOUNTER — Other Ambulatory Visit (HOSPITAL_BASED_OUTPATIENT_CLINIC_OR_DEPARTMENT_OTHER): Payer: Self-pay

## 2022-03-31 MED ORDER — ZOSTER VAC RECOMB ADJUVANTED 50 MCG/0.5ML IM SUSR
INTRAMUSCULAR | 0 refills | Status: DC
  Filled 2022-03-31: qty 1, 1d supply, fill #0

## 2022-04-05 ENCOUNTER — Other Ambulatory Visit: Payer: Self-pay | Admitting: Family Medicine

## 2022-04-05 DIAGNOSIS — I10 Essential (primary) hypertension: Secondary | ICD-10-CM

## 2022-04-15 ENCOUNTER — Telehealth: Payer: Self-pay | Admitting: Family Medicine

## 2022-04-15 NOTE — Telephone Encounter (Signed)
Left message for patient to call back and schedule Medicare Annual Wellness Visit (AWV).  ? ?Please offer to do virtually or by telephone.  Left office number and my jabber [phone]. ? ?Last AWV:08/05/2016 ? ?Please schedule at anytime with Nurse Health Advisor. ?  ?

## 2022-05-14 ENCOUNTER — Other Ambulatory Visit: Payer: Self-pay | Admitting: Family Medicine

## 2022-05-14 DIAGNOSIS — R42 Dizziness and giddiness: Secondary | ICD-10-CM

## 2022-06-02 DIAGNOSIS — H401133 Primary open-angle glaucoma, bilateral, severe stage: Secondary | ICD-10-CM | POA: Diagnosis not present

## 2022-06-02 DIAGNOSIS — H409 Unspecified glaucoma: Secondary | ICD-10-CM | POA: Diagnosis not present

## 2022-06-02 DIAGNOSIS — H25812 Combined forms of age-related cataract, left eye: Secondary | ICD-10-CM | POA: Diagnosis not present

## 2022-06-02 DIAGNOSIS — H2513 Age-related nuclear cataract, bilateral: Secondary | ICD-10-CM | POA: Diagnosis not present

## 2022-06-02 DIAGNOSIS — H47233 Glaucomatous optic atrophy, bilateral: Secondary | ICD-10-CM | POA: Diagnosis not present

## 2022-06-02 DIAGNOSIS — H269 Unspecified cataract: Secondary | ICD-10-CM | POA: Diagnosis not present

## 2022-07-04 ENCOUNTER — Other Ambulatory Visit: Payer: Self-pay | Admitting: Family Medicine

## 2022-08-20 DIAGNOSIS — L910 Hypertrophic scar: Secondary | ICD-10-CM | POA: Diagnosis not present

## 2022-08-21 ENCOUNTER — Ambulatory Visit: Payer: Medicare Other | Admitting: Family Medicine

## 2022-08-29 ENCOUNTER — Other Ambulatory Visit: Payer: Self-pay | Admitting: Family Medicine

## 2022-09-01 ENCOUNTER — Emergency Department (HOSPITAL_COMMUNITY): Payer: Medicare Other

## 2022-09-01 ENCOUNTER — Observation Stay (HOSPITAL_COMMUNITY): Payer: Medicare Other

## 2022-09-01 ENCOUNTER — Other Ambulatory Visit: Payer: Self-pay

## 2022-09-01 ENCOUNTER — Encounter (HOSPITAL_COMMUNITY): Payer: Self-pay | Admitting: *Deleted

## 2022-09-01 ENCOUNTER — Inpatient Hospital Stay (HOSPITAL_COMMUNITY)
Admission: EM | Admit: 2022-09-01 | Discharge: 2022-09-07 | DRG: 682 | Disposition: A | Discharge status: Home Health | Payer: Medicare Other | Attending: Family Medicine | Admitting: Family Medicine

## 2022-09-01 DIAGNOSIS — M25561 Pain in right knee: Secondary | ICD-10-CM | POA: Diagnosis not present

## 2022-09-01 DIAGNOSIS — Z6831 Body mass index (BMI) 31.0-31.9, adult: Secondary | ICD-10-CM

## 2022-09-01 DIAGNOSIS — Z841 Family history of disorders of kidney and ureter: Secondary | ICD-10-CM

## 2022-09-01 DIAGNOSIS — Z8546 Personal history of malignant neoplasm of prostate: Secondary | ICD-10-CM

## 2022-09-01 DIAGNOSIS — J690 Pneumonitis due to inhalation of food and vomit: Secondary | ICD-10-CM | POA: Diagnosis not present

## 2022-09-01 DIAGNOSIS — N179 Acute kidney failure, unspecified: Principal | ICD-10-CM | POA: Diagnosis present

## 2022-09-01 DIAGNOSIS — T502X5A Adverse effect of carbonic-anhydrase inhibitors, benzothiadiazides and other diuretics, initial encounter: Secondary | ICD-10-CM | POA: Diagnosis present

## 2022-09-01 DIAGNOSIS — I130 Hypertensive heart and chronic kidney disease with heart failure and stage 1 through stage 4 chronic kidney disease, or unspecified chronic kidney disease: Secondary | ICD-10-CM | POA: Diagnosis present

## 2022-09-01 DIAGNOSIS — I700 Atherosclerosis of aorta: Secondary | ICD-10-CM | POA: Diagnosis present

## 2022-09-01 DIAGNOSIS — I4892 Unspecified atrial flutter: Secondary | ICD-10-CM | POA: Diagnosis present

## 2022-09-01 DIAGNOSIS — Z452 Encounter for adjustment and management of vascular access device: Secondary | ICD-10-CM | POA: Diagnosis not present

## 2022-09-01 DIAGNOSIS — Z4682 Encounter for fitting and adjustment of non-vascular catheter: Secondary | ICD-10-CM | POA: Diagnosis not present

## 2022-09-01 DIAGNOSIS — J9811 Atelectasis: Secondary | ICD-10-CM | POA: Diagnosis not present

## 2022-09-01 DIAGNOSIS — R7401 Elevation of levels of liver transaminase levels: Secondary | ICD-10-CM

## 2022-09-01 DIAGNOSIS — J984 Other disorders of lung: Secondary | ICD-10-CM | POA: Diagnosis not present

## 2022-09-01 DIAGNOSIS — R935 Abnormal findings on diagnostic imaging of other abdominal regions, including retroperitoneum: Secondary | ICD-10-CM | POA: Diagnosis not present

## 2022-09-01 DIAGNOSIS — E440 Moderate protein-calorie malnutrition: Secondary | ICD-10-CM | POA: Diagnosis present

## 2022-09-01 DIAGNOSIS — G9340 Encephalopathy, unspecified: Secondary | ICD-10-CM | POA: Diagnosis not present

## 2022-09-01 DIAGNOSIS — K769 Liver disease, unspecified: Secondary | ICD-10-CM | POA: Diagnosis present

## 2022-09-01 DIAGNOSIS — Z833 Family history of diabetes mellitus: Secondary | ICD-10-CM

## 2022-09-01 DIAGNOSIS — R059 Cough, unspecified: Secondary | ICD-10-CM | POA: Diagnosis not present

## 2022-09-01 DIAGNOSIS — E669 Obesity, unspecified: Secondary | ICD-10-CM | POA: Diagnosis present

## 2022-09-01 DIAGNOSIS — J9601 Acute respiratory failure with hypoxia: Secondary | ICD-10-CM | POA: Diagnosis not present

## 2022-09-01 DIAGNOSIS — R0609 Other forms of dyspnea: Secondary | ICD-10-CM | POA: Diagnosis not present

## 2022-09-01 DIAGNOSIS — I952 Hypotension due to drugs: Secondary | ICD-10-CM | POA: Diagnosis not present

## 2022-09-01 DIAGNOSIS — Z823 Family history of stroke: Secondary | ICD-10-CM

## 2022-09-01 DIAGNOSIS — Z20822 Contact with and (suspected) exposure to covid-19: Secondary | ICD-10-CM | POA: Diagnosis present

## 2022-09-01 DIAGNOSIS — I959 Hypotension, unspecified: Secondary | ICD-10-CM | POA: Diagnosis not present

## 2022-09-01 DIAGNOSIS — I517 Cardiomegaly: Secondary | ICD-10-CM | POA: Diagnosis not present

## 2022-09-01 DIAGNOSIS — I739 Peripheral vascular disease, unspecified: Secondary | ICD-10-CM | POA: Diagnosis not present

## 2022-09-01 DIAGNOSIS — Z4659 Encounter for fitting and adjustment of other gastrointestinal appliance and device: Secondary | ICD-10-CM | POA: Diagnosis not present

## 2022-09-01 DIAGNOSIS — Z8249 Family history of ischemic heart disease and other diseases of the circulatory system: Secondary | ICD-10-CM

## 2022-09-01 DIAGNOSIS — R509 Fever, unspecified: Secondary | ICD-10-CM | POA: Diagnosis not present

## 2022-09-01 DIAGNOSIS — E860 Dehydration: Secondary | ICD-10-CM | POA: Diagnosis present

## 2022-09-01 DIAGNOSIS — W19XXXA Unspecified fall, initial encounter: Principal | ICD-10-CM | POA: Insufficient documentation

## 2022-09-01 DIAGNOSIS — I6782 Cerebral ischemia: Secondary | ICD-10-CM | POA: Diagnosis not present

## 2022-09-01 DIAGNOSIS — W19XXXD Unspecified fall, subsequent encounter: Secondary | ICD-10-CM

## 2022-09-01 DIAGNOSIS — Z880 Allergy status to penicillin: Secondary | ICD-10-CM

## 2022-09-01 DIAGNOSIS — R29818 Other symptoms and signs involving the nervous system: Secondary | ICD-10-CM | POA: Diagnosis not present

## 2022-09-01 DIAGNOSIS — R748 Abnormal levels of other serum enzymes: Secondary | ICD-10-CM | POA: Diagnosis not present

## 2022-09-01 DIAGNOSIS — N1831 Chronic kidney disease, stage 3a: Secondary | ICD-10-CM | POA: Diagnosis present

## 2022-09-01 DIAGNOSIS — I5032 Chronic diastolic (congestive) heart failure: Secondary | ICD-10-CM | POA: Diagnosis present

## 2022-09-01 DIAGNOSIS — J9600 Acute respiratory failure, unspecified whether with hypoxia or hypercapnia: Secondary | ICD-10-CM | POA: Diagnosis not present

## 2022-09-01 DIAGNOSIS — Z043 Encounter for examination and observation following other accident: Secondary | ICD-10-CM | POA: Diagnosis not present

## 2022-09-01 DIAGNOSIS — R531 Weakness: Secondary | ICD-10-CM | POA: Diagnosis not present

## 2022-09-01 DIAGNOSIS — Z800 Family history of malignant neoplasm of digestive organs: Secondary | ICD-10-CM

## 2022-09-01 DIAGNOSIS — I4891 Unspecified atrial fibrillation: Secondary | ICD-10-CM | POA: Diagnosis not present

## 2022-09-01 DIAGNOSIS — R6889 Other general symptoms and signs: Secondary | ICD-10-CM | POA: Diagnosis not present

## 2022-09-01 DIAGNOSIS — I499 Cardiac arrhythmia, unspecified: Secondary | ICD-10-CM

## 2022-09-01 DIAGNOSIS — R262 Difficulty in walking, not elsewhere classified: Secondary | ICD-10-CM | POA: Diagnosis not present

## 2022-09-01 DIAGNOSIS — Z9049 Acquired absence of other specified parts of digestive tract: Secondary | ICD-10-CM

## 2022-09-01 DIAGNOSIS — M6282 Rhabdomyolysis: Secondary | ICD-10-CM | POA: Diagnosis not present

## 2022-09-01 DIAGNOSIS — Z801 Family history of malignant neoplasm of trachea, bronchus and lung: Secondary | ICD-10-CM

## 2022-09-01 DIAGNOSIS — R569 Unspecified convulsions: Secondary | ICD-10-CM | POA: Diagnosis not present

## 2022-09-01 DIAGNOSIS — E875 Hyperkalemia: Secondary | ICD-10-CM | POA: Diagnosis not present

## 2022-09-01 DIAGNOSIS — R0689 Other abnormalities of breathing: Secondary | ICD-10-CM | POA: Diagnosis not present

## 2022-09-01 DIAGNOSIS — R0600 Dyspnea, unspecified: Secondary | ICD-10-CM | POA: Diagnosis present

## 2022-09-01 DIAGNOSIS — I10 Essential (primary) hypertension: Secondary | ICD-10-CM

## 2022-09-01 DIAGNOSIS — M47812 Spondylosis without myelopathy or radiculopathy, cervical region: Secondary | ICD-10-CM | POA: Diagnosis not present

## 2022-09-01 DIAGNOSIS — K7689 Other specified diseases of liver: Secondary | ICD-10-CM | POA: Diagnosis not present

## 2022-09-01 DIAGNOSIS — Z9181 History of falling: Secondary | ICD-10-CM

## 2022-09-01 DIAGNOSIS — E039 Hypothyroidism, unspecified: Secondary | ICD-10-CM | POA: Diagnosis present

## 2022-09-01 DIAGNOSIS — R269 Unspecified abnormalities of gait and mobility: Secondary | ICD-10-CM | POA: Diagnosis present

## 2022-09-01 DIAGNOSIS — E785 Hyperlipidemia, unspecified: Secondary | ICD-10-CM | POA: Diagnosis present

## 2022-09-01 DIAGNOSIS — Z7982 Long term (current) use of aspirin: Secondary | ICD-10-CM

## 2022-09-01 DIAGNOSIS — Z79899 Other long term (current) drug therapy: Secondary | ICD-10-CM

## 2022-09-01 DIAGNOSIS — T465X5A Adverse effect of other antihypertensive drugs, initial encounter: Secondary | ICD-10-CM | POA: Diagnosis present

## 2022-09-01 DIAGNOSIS — R160 Hepatomegaly, not elsewhere classified: Secondary | ICD-10-CM | POA: Diagnosis not present

## 2022-09-01 DIAGNOSIS — Z743 Need for continuous supervision: Secondary | ICD-10-CM | POA: Diagnosis not present

## 2022-09-01 DIAGNOSIS — E0590 Thyrotoxicosis, unspecified without thyrotoxic crisis or storm: Secondary | ICD-10-CM | POA: Diagnosis not present

## 2022-09-01 DIAGNOSIS — K5730 Diverticulosis of large intestine without perforation or abscess without bleeding: Secondary | ICD-10-CM | POA: Diagnosis not present

## 2022-09-01 DIAGNOSIS — A419 Sepsis, unspecified organism: Secondary | ICD-10-CM | POA: Diagnosis not present

## 2022-09-01 DIAGNOSIS — E861 Hypovolemia: Secondary | ICD-10-CM | POA: Diagnosis present

## 2022-09-01 DIAGNOSIS — E876 Hypokalemia: Secondary | ICD-10-CM | POA: Diagnosis not present

## 2022-09-01 DIAGNOSIS — G319 Degenerative disease of nervous system, unspecified: Secondary | ICD-10-CM | POA: Diagnosis not present

## 2022-09-01 DIAGNOSIS — Z87891 Personal history of nicotine dependence: Secondary | ICD-10-CM

## 2022-09-01 DIAGNOSIS — G9341 Metabolic encephalopathy: Secondary | ICD-10-CM | POA: Diagnosis present

## 2022-09-01 DIAGNOSIS — Z8719 Personal history of other diseases of the digestive system: Secondary | ICD-10-CM

## 2022-09-01 DIAGNOSIS — R52 Pain, unspecified: Secondary | ICD-10-CM | POA: Diagnosis not present

## 2022-09-01 DIAGNOSIS — R590 Localized enlarged lymph nodes: Secondary | ICD-10-CM | POA: Diagnosis present

## 2022-09-01 DIAGNOSIS — R296 Repeated falls: Secondary | ICD-10-CM | POA: Diagnosis present

## 2022-09-01 HISTORY — DX: Atherosclerosis of aorta: I70.0

## 2022-09-01 HISTORY — DX: Peripheral vascular disease, unspecified: I73.9

## 2022-09-01 HISTORY — DX: Weakness: R53.1

## 2022-09-01 LAB — I-STAT ARTERIAL BLOOD GAS, ED
Acid-Base Excess: 6 mmol/L — ABNORMAL HIGH (ref 0.0–2.0)
Bicarbonate: 27.9 mmol/L (ref 20.0–28.0)
Calcium, Ion: 1.12 mmol/L — ABNORMAL LOW (ref 1.15–1.40)
HCT: 38 % — ABNORMAL LOW (ref 39.0–52.0)
Hemoglobin: 12.9 g/dL — ABNORMAL LOW (ref 13.0–17.0)
O2 Saturation: 100 %
Patient temperature: 103.5
Potassium: 3.2 mmol/L — ABNORMAL LOW (ref 3.5–5.1)
Sodium: 137 mmol/L (ref 135–145)
TCO2: 29 mmol/L (ref 22–32)
pCO2 arterial: 37 mmHg (ref 32–48)
pH, Arterial: 7.496 — ABNORMAL HIGH (ref 7.35–7.45)
pO2, Arterial: 278 mmHg — ABNORMAL HIGH (ref 83–108)

## 2022-09-01 LAB — CBG MONITORING, ED: Glucose-Capillary: 127 mg/dL — ABNORMAL HIGH (ref 70–99)

## 2022-09-01 LAB — COMPREHENSIVE METABOLIC PANEL
ALT: 107 U/L — ABNORMAL HIGH (ref 0–44)
AST: 241 U/L — ABNORMAL HIGH (ref 15–41)
Albumin: 2.8 g/dL — ABNORMAL LOW (ref 3.5–5.0)
Alkaline Phosphatase: 74 U/L (ref 38–126)
Anion gap: 16 — ABNORMAL HIGH (ref 5–15)
BUN: 77 mg/dL — ABNORMAL HIGH (ref 8–23)
CO2: 28 mmol/L (ref 22–32)
Calcium: 8.9 mg/dL (ref 8.9–10.3)
Chloride: 94 mmol/L — ABNORMAL LOW (ref 98–111)
Creatinine, Ser: 2.5 mg/dL — ABNORMAL HIGH (ref 0.61–1.24)
GFR, Estimated: 25 mL/min — ABNORMAL LOW (ref >60)
Glucose, Bld: 165 mg/dL — ABNORMAL HIGH (ref 70–99)
Potassium: 2.8 mmol/L — ABNORMAL LOW (ref 3.5–5.1)
Sodium: 138 mmol/L (ref 135–145)
Total Bilirubin: 1.2 mg/dL (ref 0.3–1.2)
Total Protein: 6.4 g/dL — ABNORMAL LOW (ref 6.5–8.1)

## 2022-09-01 LAB — CBC WITH DIFFERENTIAL/PLATELET
Abs Immature Granulocytes: 0 10*3/uL (ref 0.00–0.07)
Basophils Absolute: 0 10*3/uL (ref 0.0–0.1)
Basophils Relative: 0 %
Eosinophils Absolute: 0 10*3/uL (ref 0.0–0.5)
Eosinophils Relative: 0 %
HCT: 36 % — ABNORMAL LOW (ref 39.0–52.0)
Hemoglobin: 12.6 g/dL — ABNORMAL LOW (ref 13.0–17.0)
Lymphocytes Relative: 11 %
Lymphs Abs: 1.4 10*3/uL (ref 0.7–4.0)
MCH: 32.7 pg (ref 26.0–34.0)
MCHC: 35 g/dL (ref 30.0–36.0)
MCV: 93.5 fL (ref 80.0–100.0)
Monocytes Absolute: 1.4 10*3/uL — ABNORMAL HIGH (ref 0.1–1.0)
Monocytes Relative: 11 %
Neutro Abs: 9.7 10*3/uL — ABNORMAL HIGH (ref 1.7–7.7)
Neutrophils Relative %: 78 %
Platelets: 121 10*3/uL — ABNORMAL LOW (ref 150–400)
RBC: 3.85 MIL/uL — ABNORMAL LOW (ref 4.22–5.81)
RDW: 14.2 % (ref 11.5–15.5)
WBC: 12.4 10*3/uL — ABNORMAL HIGH (ref 4.0–10.5)
nRBC: 0 % (ref 0.0–0.2)
nRBC: 0 /100 WBC

## 2022-09-01 LAB — MAGNESIUM: Magnesium: 2.8 mg/dL — ABNORMAL HIGH (ref 1.7–2.4)

## 2022-09-01 LAB — HEPATITIS PANEL, ACUTE
HCV Ab: NONREACTIVE
Hep A IgM: NONREACTIVE
Hep B C IgM: NONREACTIVE
Hepatitis B Surface Ag: NONREACTIVE

## 2022-09-01 LAB — RESP PANEL BY RT-PCR (FLU A&B, COVID) ARPGX2
Influenza A by PCR: NEGATIVE
Influenza B by PCR: NEGATIVE
SARS Coronavirus 2 by RT PCR: NEGATIVE

## 2022-09-01 LAB — PROTIME-INR
INR: 1.2 (ref 0.8–1.2)
Prothrombin Time: 15.5 seconds — ABNORMAL HIGH (ref 11.4–15.2)

## 2022-09-01 LAB — TROPONIN I (HIGH SENSITIVITY): Troponin I (High Sensitivity): 113 ng/L (ref <18)

## 2022-09-01 LAB — CK: Total CK: 9441 U/L — ABNORMAL HIGH (ref 49–397)

## 2022-09-01 LAB — APTT: aPTT: 34 seconds (ref 24–36)

## 2022-09-01 MED ORDER — POTASSIUM CHLORIDE 20 MEQ PO PACK: 40.0000 meq | PACK | Freq: Once | ORAL | Status: DC

## 2022-09-01 MED ORDER — VANCOMYCIN HCL 1750 MG/350ML IV SOLN
1750.0000 mg | Freq: Once | INTRAVENOUS | Status: DC
  Filled 2022-09-01: qty 350

## 2022-09-01 MED ORDER — LACTATED RINGERS IV BOLUS
500.0000 mL | Freq: Once | INTRAVENOUS | Status: AC
  Administered 2022-09-01: 500 mL via INTRAVENOUS

## 2022-09-01 MED ORDER — PROPOFOL 1000 MG/100ML IV EMUL
5.0000 ug/kg/min | INTRAVENOUS | Status: DC
  Administered 2022-09-01: 5 ug/kg/min via INTRAVENOUS

## 2022-09-01 MED ORDER — ENOXAPARIN SODIUM 30 MG/0.3ML IJ SOSY
30.0000 mg | PREFILLED_SYRINGE | INTRAMUSCULAR | Status: DC
  Administered 2022-09-02: 30 mg via SUBCUTANEOUS
  Filled 2022-09-01: qty 0.3

## 2022-09-01 MED ORDER — SODIUM CHLORIDE 0.9 % IV BOLUS: 2000.0000 mL | Freq: Once | INTRAVENOUS | Status: DC

## 2022-09-01 MED ORDER — ACETAMINOPHEN 650 MG RE SUPP: 650.0000 mg | Freq: Once | RECTAL | Status: DC

## 2022-09-01 MED ORDER — CALCIUM GLUCONATE-NACL 1-0.675 GM/50ML-% IV SOLN
1.0000 g | Freq: Once | INTRAVENOUS | Status: AC
  Administered 2022-09-01: 1000 mg via INTRAVENOUS
  Filled 2022-09-01: qty 50

## 2022-09-01 MED ORDER — DOCUSATE SODIUM 50 MG/5ML PO LIQD
100.0000 mg | Freq: Two times a day (BID) | ORAL | Status: DC
  Administered 2022-09-02 – 2022-09-03 (×2): 100 mg
  Filled 2022-09-01 (×2): qty 10

## 2022-09-01 MED ORDER — FENTANYL CITRATE PF 50 MCG/ML IJ SOSY: 25.0000 ug | PREFILLED_SYRINGE | INTRAMUSCULAR | Status: DC | PRN

## 2022-09-01 MED ORDER — POTASSIUM CHLORIDE CRYS ER 20 MEQ PO TBCR
60.0000 meq | EXTENDED_RELEASE_TABLET | Freq: Once | ORAL | Status: AC
  Administered 2022-09-01: 60 meq via ORAL
  Filled 2022-09-01: qty 3

## 2022-09-01 MED ORDER — MIDAZOLAM HCL 2 MG/2ML IJ SOLN: 1.0000 mg | INTRAMUSCULAR | Status: DC | PRN

## 2022-09-01 MED ORDER — PROPOFOL 1000 MG/100ML IV EMUL
INTRAVENOUS | Status: AC
  Filled 2022-09-01: qty 100

## 2022-09-01 MED ORDER — PANTOPRAZOLE 2 MG/ML SUSPENSION: 40.0000 mg | Freq: Every day | ORAL | Status: DC

## 2022-09-01 MED ORDER — ROCURONIUM BROMIDE 50 MG/5ML IV SOLN
INTRAVENOUS | Status: DC | PRN
  Administered 2022-09-01: 100 mg via INTRAVENOUS

## 2022-09-01 MED ORDER — ACETAMINOPHEN 650 MG RE SUPP
RECTAL | Status: AC
  Filled 2022-09-01: qty 1

## 2022-09-01 MED ORDER — MIDAZOLAM HCL 2 MG/2ML IJ SOLN
1.0000 mg | INTRAMUSCULAR | Status: DC | PRN
  Administered 2022-09-02: 2 mg via INTRAVENOUS
  Administered 2022-09-02: 1 mg via INTRAVENOUS
  Filled 2022-09-01 (×2): qty 2

## 2022-09-01 MED ORDER — FENTANYL CITRATE PF 50 MCG/ML IJ SOSY
25.0000 ug | PREFILLED_SYRINGE | INTRAMUSCULAR | Status: DC | PRN
  Administered 2022-09-02: 25 ug via INTRAVENOUS
  Administered 2022-09-02: 100 ug via INTRAVENOUS
  Filled 2022-09-01: qty 2

## 2022-09-01 MED ORDER — SODIUM CHLORIDE 0.9 % IV BOLUS
1500.0000 mL | Freq: Once | INTRAVENOUS | Status: AC
  Administered 2022-09-01: 1500 mL via INTRAVENOUS

## 2022-09-01 MED ORDER — LACTATED RINGERS IV BOLUS
1000.0000 mL | Freq: Once | INTRAVENOUS | Status: AC
  Administered 2022-09-01: 1000 mL via INTRAVENOUS

## 2022-09-01 MED ORDER — SODIUM CHLORIDE 0.9% FLUSH
3.0000 mL | Freq: Two times a day (BID) | INTRAVENOUS | Status: DC
  Administered 2022-09-02 – 2022-09-07 (×11): 3 mL via INTRAVENOUS

## 2022-09-01 MED ORDER — ASPIRIN 81 MG PO CHEW
324.0000 mg | CHEWABLE_TABLET | Freq: Once | ORAL | Status: AC
  Administered 2022-09-01: 324 mg via ORAL
  Filled 2022-09-01: qty 4

## 2022-09-01 MED ORDER — ETOMIDATE 2 MG/ML IV SOLN
INTRAVENOUS | Status: DC | PRN
  Administered 2022-09-01: 30 mg via INTRAVENOUS

## 2022-09-01 MED ORDER — PANTOPRAZOLE SODIUM 40 MG IV SOLR: 40.0000 mg | Freq: Every day | INTRAVENOUS | Status: DC

## 2022-09-01 MED ORDER — LACTATED RINGERS IV BOLUS: 500.0000 mL | Freq: Once | INTRAVENOUS | Status: DC

## 2022-09-01 MED ORDER — OXYCODONE-ACETAMINOPHEN 5-325 MG PO TABS
1.0000 | ORAL_TABLET | Freq: Once | ORAL | Status: AC
  Administered 2022-09-01: 1 via ORAL
  Filled 2022-09-01: qty 1

## 2022-09-01 MED ORDER — LACTATED RINGERS IV SOLN
INTRAVENOUS | Status: DC
  Administered 2022-09-02: 100 mL/h via INTRAVENOUS

## 2022-09-01 MED ORDER — SODIUM CHLORIDE 0.9 % IV SOLN: 2.0000 g | Freq: Two times a day (BID) | INTRAVENOUS | Status: DC

## 2022-09-01 MED ORDER — POLYETHYLENE GLYCOL 3350 17 G PO PACK
17.0000 g | PACK | Freq: Every day | ORAL | Status: DC
  Administered 2022-09-03: 17 g
  Filled 2022-09-01: qty 1

## 2022-09-01 NOTE — Progress Notes (Signed)
FMTS Interim Progress Note  S:Went to bedside to check on patient alongside Dr. Sabra Heck, patient eating his dinner. Denies any concerns. Briefly discussed reason for admission and all questions answered.   O: BP (!) 90/57   Pulse 69   Temp 97.7 F (36.5 C) (Oral)   Resp (!) 22   Ht '5\' 4"'$  (1.626 m)   Wt 83 kg   SpO2 94%   BMI 31.41 kg/m   General: Patient sitting upright eating his dinner, in no acute distress. Resp: normal work of breathing, no signs of respiratory distress  A/P: Patient admitted for AKI, s/p 1L LR bolus. Will monitor Cr, next check in the morning. Continue to avoid nephrotoxic agents. Vitals stable, telemetry and orders reviewed. Remainder of plan per day team.   Donney Dice, DO 09/01/2022, 8:29 PM PGY-3, [facility] Service pager [phone]

## 2022-09-01 NOTE — H&P (Cosign Needed Addendum)
Hospital Admission History and Physical Service Pager: [phone]  Patient name: [name] Gibson Medical record number: [id number] Date of Birth: 06-20-1941 Age: 81 y.o. Gender: male  Primary Care Provider: Carollee Herter, Alferd Apa, DO Consultants: None Code Status: Full   Preferred Emergency Contact:  Daughter Mammie Russian [phone]  (call daughter first) Inez Catalina (sister) is second emergency contact  Chief Complaint: AKI, R knee pain  Assessment and Plan: [name] Gibson is a 81 y.o. male presenting with R knee pain and weakness, then found to have AKI and hypokalemia. Differential for R knee symptoms, infectious (septic arthritis) and inflammatory (myositis) etiologies are considered given leukocytosis and elevated CK but LE's are symmetric and no sign of inflammation in knee joints. Structural causes of knee pain (fracture, lytic bone lesion) are less likely given negative imaging and relatively benign knee exam. Nerve pain such as sciatica or nerve impingement are considered, although pain does not have a radiating nature. Furthermore, pt also presented w/ AKI and hypokalemia. Pt appears to be on multiple HTN meds and manages meds alone. Highest on differential is pre-renal and HTN med toxicity.   * Acute renal failure (ARF) (HCC) On stage IIIa CKD. Appears pre-renal in origin based on BUN:cr ratio. Dehydration vs medication and hypotension induced, but does also have elevated CK. Received total of 1L IVF in ER.  - Admit to FMTS, attending Dr. McDiarmid - Monitor oral intake - Encourage PO fluids. Not currently on mIVF - Holding home amlodipine, chlorthalidone, losartan, metoprolol - AM CMP - Repeat EKG - Vitals per routine - Continuous cardiac telemetry d/t electrolyte changes    Hypokalemia Replete K 79mq IV - f/u CMP  Hypotension Home medications include losartan, amlodipine, metoprolol, and chlorthalidone. Hypotensive and bradycardic on admission.  - Hold home BP  meds - Continuous telemetry - Vitals per routine - IVF bolus can be given if MAP <60  Impaired ambulation Secondary to right knee pain, not expressed on exam.  - PT/OT - Fall precautions - If pain with ambulation, consider standing knee x-rays - out of bed with assistance   Hypermagnesemia Mag level mildly elevated to 2.8. Likely secondary to renal failure. - AM Mag check   Liver mass, right lobe Incidentally noted on CT abdomen/pelvis. Recommendation was MRI follow-up that can be done outpatient. Patient does have history of prostatic cancer s/p treatment - Trend CMP - Hepatitis panel pending - Consider outpatient MRI   Irregular cardiac rhythm Abnormal rhythm on EKG, p waves present so unlikely Afib. Possible sinus with PAC's.  - Repeat EKG - Continuous telemetry  Transaminasemia Abm exam benign. Denies alc use. AST/ALT ratio >2. Normal alk phos, Tbili, INR, aPTT. CT shows poorly defined liver mass in R lobe. - CTM - f/u CMP, Hepatitis panel - Consider further imaging and workup for liver mass  Elevated creatine kinase Potentially due to pt being on the floor from fall. Also considering myositis as above. - continuous telemetry - IVF as necessary   Pain in right knee Currently nonpainful.  - PT/OT - Consider re-imaging if pain persists   FEN/GI: Regular diet VTE Prophylaxis: Lovenox  Disposition: Med Tele  History of Present Illness:  [name] PDietrich Patesis a 81y.o. male presenting with R knee pain. A few months ago, pt fell and then developed knee pain a few weeks after the fall. Pt was initially able to walk normally, but then developed R knee pain and difficulty moving the leg and trouble walking. Pain has  been present for a few weeks now. He does not use any assistance devices for walking. Since that first fall, he has had about 3-4 falls due to weakness. He has not had any head trauma or LOC. Last night the pain was really bad and he could barely get up, which is  what brought him to the hospital.  Has also been having cough and cold for about 1-2 weeks, does have an exposure to COVID from his church. Denies any fevers, but does report chills/diaphoresis. Diarrhea for the last 1-2 days, but only 2 episodes. Denies any change in his appetite.    ED Course Arrived via EMS. VSS. CMP notable for K 2.8, Cr 2.5, AST 241, ALT 107, alb 2.8. CBC showed WBC 12.4, Hgb 12.6, neutrophilia. Trop elevated to 113. Mg elevated 2.8. CK 9441. INR 1.2 and aptt 34. CT showed hypodense mass in R love of liver. CT spine showed no acute changes. CXR neg for acute abnormalities. Pelvic XR showed no acute changes. R knee XR unremarkable. EKG shows rate controlled Afib. Pt received Percocet, ASA, K 68mq, and 1L NS.    Pertinent Past Medical History: Glaucoma, HTN, HLD, hx of prostate cancer  Remainder reviewed in history tab.   Pertinent Past Surgical History: Appendectomy   Remainder reviewed in history tab.  Pertinent Social History: Tobacco use: Former Alcohol use: None Other Substance use: None [place] caret[place].   Pertinent Family History: Cancer in father, brother, and sister  Remainder reviewed in history tab.   Important Outpatient Medications: Amlodipine ASA Atorvastatin Lumigan Chlorthalidone Losartan Metoprolol  Remainder reviewed in medication history.   Objective: BP (!) 90/57   Pulse 69   Temp 97.7 F (36.5 C) (Oral)   Resp (!) 22   Ht 5' 4"  (1.626 m)   Wt 83 kg   SpO2 94%   BMI 31.41 kg/m  Exam: General: Alert, calm older man laying in bed. NAD. Neuro: Oriented to person, place, situation, and year.  Cardiovascular: RRR, no murmurs.  Respiratory: CTAB. Normal WOB on RA. Gastrointestinal: Soft, nontender, nondistended. Normal BS.  MSK: Very mildly tender around R patella. Normal R knee ROM and nonpainful. LE are symmetric in appearance.   Labs:  CBC BMET  Recent Labs  Lab 09/01/22 1020  WBC  12.4*  HGB 12.6*  HCT 36.0*  PLT 121*   Recent Labs  Lab 09/01/22 1020  NA 138  K 2.8*  CL 94*  CO2 28  BUN 77*  CREATININE 2.50*  GLUCOSE 165*  CALCIUM 8.9    Pertinent additional labs  CMP notable for K 2.8, Cr 2.5, AST 241, ALT 107, alb 2.8. CBC showed WBC 12.4, Hgb 12.6, neutrophilia. Trop elevated to 113. Mg elevated 2.8. CK 9441. INR 1.2 and aptt 34.    EKG: My own interpretation: irregularly irregular c/w rate controlled Afib   Imaging Studies Performed: CT showed hypodense mass in R love of liver. CT spine showed no acute changes. CXR neg for acute abnormalities. Pelvic XR showed no acute changes. R knee XR unremarkable.  ZArlyce Dice MD 09/01/2022, 4:15 PM PGY-1, [place] [facility] pager: [phone] text pages welcome Secure chat group CEast [facility]owUpper-Level Resident Addendum   I have independently interviewed and examined the patient. I have discussed the above with the original author and agree with their documentation. My edits for correction/addition/clarification are in within  the document. Please see also any attending notes.   Rise Patience, DO  PGY-3, [facility] 09/01/2022 5:07 PM  FPTS Service pager: [phone] (text pages welcome through [facility])

## 2022-09-01 NOTE — H&P (Signed)
I have interviewed and examined the patient.  I have discussed the case with Dr. Markus Jarvis and Dr Oleh Genin.   I agree with their documentation and management in their admit note.  Principal Problem:   Acute renal failure (ARF) ([facility])            - Presumed due to medication-induced hypotension and hypovolemia from thiazide and recent acute illness with diarrhea             - CT AP reading mentions "bilateral perinephric fat stranding" which may need to be further assessed if fluid resuscitation does not bring down serum creatinine or urinalysis sediment is active.  Active Problems:   Prolonged Lie            - Secondary to transient right knee pain and right leg weakness impairing ability to stand and walk            - Resulting in elevated creatine kinase   Transaminasemia             - Possible origin is same as the pressure skeletal muscle injury causing elevated creatine kinase serum level.  The nonspecific right lobe indistinct liver lesion could also have a role in enzyme elevations                    Irregular cardiac rhythm              - 9/25 1453 EKG with transient flutter-waves with irregular narrow QRS concerning for a paroxysm of    A-flutter/fib        Liver mass, right lobe     IVF Volume repletion Follow up Urinalysis Transthoracic echocardiogram to look for structural cardiac disease and further telemetry monitoring to look for confirmation of a possible atrial dysrhythmia.  Correction of hypokalemia Abdominal MRI outpatient to further characterize liver lesion seen on CT AP Physical Therapy/Occupational Therapy evaluation    LOS: 0 days                                        For questions or updates, please contact Family Medicine Teaching Service Resident On-call at pager [phone] or  [email or web address] - see pager "[place]"

## 2022-09-01 NOTE — ED Notes (Signed)
Admitting MD at bedside aware of b/p

## 2022-09-01 NOTE — Assessment & Plan Note (Deleted)
Replete K 68mq - AM CMP

## 2022-09-01 NOTE — Consult Note (Signed)
NAME:  [name] Gibson, MRN:  [id number], DOB:  [birth year], LOS: 0 ADMISSION DATE:  09/01/2022, CONSULTATION DATE:  09/01/22 REFERRING MD:  McDiarmid CHIEF COMPLAINT:  AMS   History of Present Illness:  [name] Gibson is a 81 y.o. male who has a PMH as below. He presented to [facility] ED 9/25 with R knee pain after he sustained a mechanical fall a few weeks prior. Pain did not resolve so he came to ED for further evaluation. Since that initial fall, he has had an additional 3-4 falls due to weakness. No trauma or LOC on any of the falls.  Had apparently also had cough and cold for 2 weeks and was exposed to COVID at his church. Denied any fevers, myalgias but did report some chills, diaphoresis, and diarrhea x 2 over the 2 days prior to ED presentation.  He was found to have AKI with rhabdo, transaminitis and possible R liver mass. He was initially admitted by FMTS for further evaluation and management.  After he was admitted, he ate dinner and was later found in ED stretcher minimally responsive, hypoxic to 60s and 70s and tachypneic to 30s and 40s. Prior to this, he was on room air and was alert and oriented and able to communicate appropriately. He was also noted to have fever to 103. There was concern for some tremor like activity prior to FMTS being contacted.  PCCM called to see in consultation given acute change. During my evaluation, it was decided that best course of action would be to proceed with intubation which EDP graciously assisted with. Prior to intubation, he was noted to have bilateral upper extremity jerking along with horizontal nystagmus and horizontal side to side head shaking. This resolved after a few seconds. Neurology was consulted and saw pt in ED. He has CT head pending and will likely get EEG as well as consideration for lumbar puncture.   Pertinent  Medical History:  has COLONIC POLYPS; HYPERTHYROIDISM; Hyperlipidemia; GLAUCOMA; Primary hypertension; SINUSITIS- ACUTE-NOS; URI;  DIVERTICULOSIS OF COLON; PSA, INCREASED; History of prostate cancer; GLUCOSE INTOLERANCE, HX OF; Obesity (BMI 30-39.9); Dizziness; Preventative health care; Traumatic hematoma of head; Pain in upper jaw; Thyroid function study abnormality; Insomnia; Acute renal failure (ARF) ([place]); Atherosclerosis of aorta (HCC); PAD (peripheral artery disease) ([place]); Generalized weakness; Impaired ambulation; Pain in right knee; Hypokalemia; Elevated creatine kinase; Transaminasemia; Hypotension; Irregular cardiac rhythm; Dyspnea; Liver mass, right lobe;  Bilateral perinephric fat stranding CT; Hypermagnesemia; Fall; and Acute encephalopathy on their problem list.  Significant Hospital Events: Including procedures, antibiotic start and stop dates in addition to other pertinent events   9/25 admitted. Acute change in mental status along with hypoxia and resp failure requiring intubation. Unclear whether had aspiration event followed by seizures and post ictal state, versus seizures as initial inticing event.  Interim History / Subjective:  Sedated and paralyzed, just intubated.  Objective:  Blood pressure (!) 170/78, pulse 98, temperature (!) 103.6 F (39.8 C), temperature source Axillary, resp. rate 15, height '5\' 4"'$  (1.626 m), weight 83 kg, SpO2 98 %.    Vent Mode: PRVC FiO2 (%):  [100 %] 100 % Set Rate:  [15 bmp] 15 bmp Vt Set:  [470 mL] 470 mL PEEP:  [5 cmH20] 5 cmH20 Plateau Pressure:  [18 cmH20] 18 cmH20   Intake/Output Summary (Last 24 hours) at 09/01/2022 2257 Last data filed at 09/01/2022 2254 Gross per 24 hour  Intake 1349.9 ml  Output --  Net 1349.9 ml   [employer]  09/01/22 1054  Weight: 83 kg    Examination: General: Adult male, resting in bed, in NAD. Neuro: Sedated and paralyzed. HEENT: /AT. Sclerae anicteric. ETT in place. Cardiovascular: RRR, no M/R/G.  Lungs: Respirations even and unlabored.  CTA bilaterally, No W/R/R. Abdomen: BS x 4, soft, NT/ND.  Musculoskeletal: No  gross deformities, no edema.  Skin: Intact, warm, no rashes.  Labs/imaging personally reviewed:  CT chest/abd/pelv 9/25 > ill defined mass in right lobe of liver. Marked bilateral perinephric stranding. CT head 9/25 >  EEG 9/25 >   Assessment & Plan:   Acute hypoxic respiratory failure - presumed 2/2 aspiration event in ED. Now s/p intubation in ED. - Full vent support. - Wean as mental status allows. - Ceftriaxone. - Blood and sputum cultures. - Bronchial hygiene. - Follow CXR.  Possible new onset seizures.  - Neurology consulted, appreciate the assistance. - EEG and AED's per neuro. - F/u CT head.  Possible meningitis - new onset seizures with 56 F. - Empiric Vanc/Ceftriaxone/Bactrim (PCN allergy)/Acyclovir. - Neuro to perform LP shortly.  AKI. Hypokalemia. Rhabdomyolysis. Hypocalcemia. - 2L NS bolus now followed by LR at 100/hr. - 62mq K per tube. - Follow BMP.  Transaminitis - unclear etiology, possibly hepatic congestion vs intrahepatic process given incidental finding of R liver mass on imaging. Hepatitis panel negative. R liver mass - incidental finding on CT chest/abd/pelvis. - Add APAP level. - Trend LFT's. - Consider dedicated hepatic MRI once over acute issues.  Hx HTN. - Hold home Amlodipine, Chlortahlidone, Losartan, Metoprolol.  Multiple recent falls. - PT/OT eval once over acute issues.   Called and updated pt's sister, BLiane Comber She was unaware that he was hospitalized. She appreciated the call and asked for daily updates as she lives out of town.  Best practice (evaluated daily):  Diet/type: NPO DVT prophylaxis: LMWH GI prophylaxis: PPI Lines: N/A Foley:  N/A Code Status:  full code Last date of multidisciplinary goals of care discussion: None yet.  Labs   CBC: Recent Labs  Lab 09/01/22 1020 09/01/22 2142  WBC 12.4*  --   NEUTROABS 9.7*  --   HGB 12.6* 12.9*  HCT 36.0* 38.0*  MCV 93.5  --   PLT 121*  --     Basic  Metabolic Panel: Recent Labs  Lab 09/01/22 1020 09/01/22 2142  NA 138 137  K 2.8* 3.2*  CL 94*  --   CO2 28  --   GLUCOSE 165*  --   BUN 77*  --   CREATININE 2.50*  --   CALCIUM 8.9  --   MG 2.8*  --    GFR: Estimated Creatinine Clearance: 21.8 mL/min (A) (by C-G formula based on SCr of 2.5 mg/dL (H)). Recent Labs  Lab 09/01/22 1020  WBC 12.4*    Liver Function Tests: Recent Labs  Lab 09/01/22 1020  AST 241*  ALT 107*  ALKPHOS 74  BILITOT 1.2  PROT 6.4*  ALBUMIN 2.8*   No results for input(s): "LIPASE", "AMYLASE" in the last 168 hours. No results for input(s): "AMMONIA" in the last 168 hours.  ABG    Component Value Date/Time   PHART 7.496 (H) 09/01/2022 2142   PCO2ART 37.0 09/01/2022 2142   PO2ART 278 (H) 09/01/2022 2142   HCO3 27.9 09/01/2022 2142   TCO2 29 09/01/2022 2142   O2SAT 100 09/01/2022 2142     Coagulation Profile: Recent Labs  Lab 09/01/22 1218  INR 1.2    Cardiac Enzymes: Recent Labs  Lab  09/01/22 1218  CKTOTAL 9,441*    HbA1C: Hgb A1c MFr Bld  Date/Time Value Ref Range Status  10/03/2016 11:35 AM 6.0 4.6 - 6.5 % Final    Comment:    Glycemic Control Guidelines for People with Diabetes:Non Diabetic:  <6%Goal of Therapy: <7%Additional Action Suggested:  >8%   03/16/2013 10:23 AM 6.1 4.6 - 6.5 % Final    Comment:    Glycemic Control Guidelines for People with Diabetes:Non Diabetic:  <6%Goal of Therapy: <7%Additional Action Suggested:  >8%     CBG: Recent Labs  Lab 09/01/22 2136  GLUCAP 127*    Review of Systems:   Unable to obtain as pt is encephalopathic.  Past Medical History:  He,  has a past medical history of Acute pain of right knee (09/28/2018), ANGIOEDEMA (08/25/2007), ANKLE PAIN, RIGHT (11/16/2008), Atherosclerosis of aorta (HCC) (09/01/2022), Cancer ([place]), COLONIC POLYPS (10/22/2007), DIVERTICULOSIS OF COLON (10/22/2007), Generalized weakness (09/01/2022), GLAUCOMA (03/23/2007), GLUCOSE INTOLERANCE, HX OF  (03/23/2007), HYPERLIPIDEMIA (03/23/2007), HYPERTENSION (09/08/2007), HYPERTHYROIDISM (03/23/2007), Obesity (BMI 30-39.9) (12/07/2013), PAD (peripheral artery disease) ([place]) (09/01/2022), PROSTATE CANCER, HX OF (10/02/2010), Right shoulder pain (02/08/2015), and Sebaceous cyst (12/13/2008).   Surgical History:   Past Surgical History:  Procedure Laterality Date   APPENDECTOMY     radioactive seed implation     prostate     Social History:   reports that he has never smoked. He has never used smokeless tobacco. He reports that he does not currently use alcohol after a past usage of about 1.0 standard drink of alcohol per week. He reports that he does not use drugs.   Family History:  His family history includes Brain cancer in his brother; Breast cancer in his sister; Cancer in his father; Colon cancer in his maternal grandmother; Coronary artery disease in an other family member; Diabetes in an other family member; Diabetes Mellitus II in his brother; Hyperlipidemia in an other family member; Hypertension in an other family member; Kidney failure in his brother; Lung cancer in his brother; Stomach cancer in his sister; Stroke in his brother, brother, and sister; Throat cancer in his brother.   Allergies Allergies  Allergen Reactions   Penicillins     REACTION: unspecified Has patient had a PCN reaction causing immediate rash, facial/tongue/throat swelling, SOB or lightheadedness with hypotension: N Has patient had a PCN reaction causing severe rash involving mucus membranes or skin necrosis: N Has patient had a PCN reaction that required hospitalization: N Has patient had a PCN reaction occurring within the last 10 years: N If all of the above answers are "NO", then may proceed with Cephalosporin use.      Home Medications  Prior to Admission medications   Medication Sig Start Date End Date Taking? Authorizing Provider  amLODipine (NORVASC) 10 MG tablet TAKE 1 TABLET(10 MG) BY MOUTH  DAILY Patient taking differently: Take 10 mg by mouth daily. 07/04/22  Yes [name], DO  aspirin EC 325 MG tablet Take 325 mg by mouth every other day.   Yes [provider]  atorvastatin (LIPITOR) 20 MG tablet Take 1 tablet (20 mg total) by mouth daily. 08/29/22  Yes [name], DO  bimatoprost (LUMIGAN) 0.01 % SOLN Place 1 drop into both eyes at bedtime.   Yes [provider]  chlorthalidone (HYGROTON) 25 MG tablet TAKE 1 TABLET(25 MG) BY MOUTH DAILY Patient taking differently: Take 25 mg by mouth daily. 04/07/22  Yes [name], DO  fluticasone (FLONASE) 50 MCG/ACT  nasal spray SHAKE LIQUID AND USE 2 SPRAYS IN EACH NOSTRIL DAILY AS NEEDED FOR ALLERGIES Patient taking differently: Place 2 sprays into both nostrils daily as needed for allergies. 05/14/22  Yes [name], DO  losartan (COZAAR) 100 MG tablet Take 1 tablet (100 mg total) by mouth daily. 12/26/21  Yes [name], DO  metoprolol (TOPROL-XL) 200 MG 24 hr tablet TAKE 1 TABLET(200 MG) BY MOUTH DAILY Patient taking differently: Take 200 mg by mouth daily. 07/04/22  Yes [name], DO  Omega-3 Fatty Acids (FISH OIL) 1360 MG CAPS Take 1 capsule (1,360 mg total) by mouth daily. 09/28/18  Yes [name], DO  COVID-19 mRNA bivalent vaccine, Pfizer, injection Inject into the muscle. 08/29/21   Carlyle Basques, MD  COVID-19 mRNA Vac-TriS, Pfizer, (PFIZER-BIONT COVID-19 VAC-TRIS) SUSP injection Inject into the muscle. 04/29/21   Carlyle Basques, MD  tiZANidine (ZANAFLEX) 4 MG tablet Take 1 tablet (4 mg total) by mouth every 6 (six) hours as needed for muscle spasms. Patient not taking: Reported on 09/01/2022 12/07/19   [name], DO  traZODone (DESYREL) 50 MG tablet Take 0.5-1 tablets (25-50 mg total) by mouth at bedtime as needed for sleep. Patient not taking: Reported on 09/01/2022 10/29/20   [name], DO  Zoster Vaccine Adjuvanted  [facility]) injection Inject into the muscle. 03/31/22   Carlyle Basques, MD     Critical care time: 45 min.   Montey Hora, [facility] For pager details, please see AMION or use Epic chat  After 1900, please call [facility] for cross coverage needs 09/01/2022, 10:57 PM

## 2022-09-01 NOTE — Assessment & Plan Note (Addendum)
EKG showed A flutter, but rate controlled.  - Home metoprolol - Consulted Cardiology, appreciate recs - Continuous telemetry

## 2022-09-01 NOTE — ED Notes (Signed)
Resp called as well

## 2022-09-01 NOTE — Assessment & Plan Note (Addendum)
Resolved maintaining PO intake and adequate UOP.  Likely 2/2 rhabdomyolysis - On home amlodipine and metoprolol - Holding home chlorthalidone and losartan - AM CMP

## 2022-09-01 NOTE — ED Triage Notes (Signed)
Patient presents to ed via [facility] c/o weakness and difficulty walking lives alone , states he fell 2 months ago and fell onto his right knee, c/o pain and it is getting worse. Alert oriented

## 2022-09-01 NOTE — ED Notes (Signed)
Placed on NRB 15lpm at this time by resp still waiting on admit provider.

## 2022-09-01 NOTE — ED Notes (Signed)
CCM at bedside 

## 2022-09-01 NOTE — Consult Note (Signed)
Neurology Consultation Reason for Consult: Unresponsiveness Referring Physician: Anise Salvo  CC: Altered mental status  History is obtained from: Patient  HPI: [name] Gibson is a 81 y.o. male with a history of hypertension, hyperlipidemia who presented with knee pain and was found to have AKI.  He was admitted for the AKI, and was mentating normally up until an abrupt change.  He was found to be tachypneic, febrile, with some posturing versus rigors versus seizure-like activity.  He was satting in the 70s, and was intubated.  He was given paralytic shortly before my evaluation.   LKW: 8:30 PM tpa given?: no, stroke not suspected    Past Medical History:  Diagnosis Date   Acute pain of right knee 09/28/2018   ANGIOEDEMA 08/25/2007   Qualifier: Diagnosis of  By: Henry Russel PAIN, RIGHT 11/16/2008   Qualifier: Diagnosis of  By: Jerold Coombe     Atherosclerosis of aorta ([place]) 09/01/2022   Cancer ([place])    prostate   COLONIC POLYPS 10/22/2007   Qualifier: Diagnosis of  By: Jerold Coombe     DIVERTICULOSIS OF COLON 10/22/2007   Qualifier: Diagnosis of  By: Jerold Coombe     Generalized weakness 09/01/2022   GLAUCOMA 03/23/2007   Qualifier: Diagnosis of  By: Etter Sjogren DO, Yvonne     GLUCOSE INTOLERANCE, HX OF 03/23/2007   Qualifier: Diagnosis of  By: Jerold Coombe     HYPERLIPIDEMIA 03/23/2007   Qualifier: Diagnosis of  By: Jerold Coombe     HYPERTENSION 09/08/2007   Qualifier: Diagnosis of  By: Jerold Coombe     HYPERTHYROIDISM 03/23/2007   Qualifier: Diagnosis of  By: Jerold Coombe  - no med   Obesity (BMI 30-39.9) 12/07/2013   PAD (peripheral artery disease) ([place]) 09/01/2022   PROSTATE CANCER, HX OF 10/02/2010   Qualifier: Diagnosis of  By: Jerold Coombe     Right shoulder pain 02/08/2015   Sebaceous cyst 12/13/2008   Qualifier: Diagnosis of  By: Jerold Coombe       Family History  Problem Relation Age of Onset   Brain cancer Brother     Diabetes Other    Hyperlipidemia Other    Hypertension Other    Coronary artery disease Other    Cancer Father        spine   Stomach cancer Sister    Kidney failure Brother    Diabetes Mellitus II Brother    Throat cancer Brother    Lung cancer Brother    Stroke Brother    Stroke Brother    Stroke Sister    Breast cancer Sister    Colon cancer Maternal Grandmother      Social History:  reports that he has never smoked. He has never used smokeless tobacco. He reports that he does not currently use alcohol after a past usage of about 1.0 standard drink of alcohol per week. He reports that he does not use drugs.   Exam: Current vital signs: BP (!) 170/78   Pulse 98   Temp (!) 103.6 F (39.8 C) (Axillary)   Resp 15   Ht '5\' 4"'$  (1.626 m)   Wt 83 kg   SpO2 98%   BMI 31.41 kg/m  Vital signs in last 24 hours: Temp:  [97.4 F (36.3 C)-103.6 F (39.8 C)] 103.6 F (39.8 C) (09/25 2124) Pulse Rate:  [56-98] 98 (09/25 2213) Resp:  [13-30] 15 (09/25 2213)  BP: (84-170)/(50-91) 170/78 (09/25 2213) SpO2:  [92 %-100 %] 98 % (09/25 2213) FiO2 (%):  [100 %] 100 % (09/25 2213) Weight:  [83 kg] 83 kg (09/25 1054)   Physical Exam  Constitutional: Appears well-developed and well-nourished.   Neuro: His pupils are equal and reactive bilaterally, further testing precluded by paralytic  I have reviewed labs in epic and the results pertinent to this consultation are: PCO2 37 Creatinine 2.5 Gram stain on trachial aspirate + for G+ cocci   I have reviewed the images obtained:CT head - negative  Impression: 81 year old male with acute mental status decline in the setting of hypoxemia.  My suspicion is that he likely aspirated resulting in hypoxemia leading to mental status decline.  Though seizure was mentioned as a possibility, I feel this is not from definite, and would not start antiepileptics unless further evidence of this was found.  Given there is a concern for seizure in the  setting of fever and a densely encephalopathic patient, I do think ruling out CNS infection would be prudent.  Recommendations: 1) LP for cells, glucose, protein, cultures.  If pleocytosis, will send meningoencephalitis panel 2) stat EEG 3) meningitic coverage if LP suspicious for infection 4) antiepileptics only if EEG is positive. 5) neurology will follow   Roland Rack, MD [facility] [phone]  If 7pm- 7am, please page neurology on call as listed in [facility].

## 2022-09-01 NOTE — ED Notes (Signed)
Neuro at bedside advises that he needs to do a lumbar puncture prior to taking pt up to the floor. He is currently on the phone with family for consent at this time

## 2022-09-01 NOTE — Assessment & Plan Note (Addendum)
Downtrending. No longer trending

## 2022-09-01 NOTE — ED Notes (Signed)
Admit providers at bedside at this time 

## 2022-09-01 NOTE — Assessment & Plan Note (Addendum)
Currently nonpainful

## 2022-09-01 NOTE — Assessment & Plan Note (Addendum)
-   f/u CMP - Consider further imaging and workup for liver mass

## 2022-09-01 NOTE — Assessment & Plan Note (Addendum)
-   Consider outpatient MRI

## 2022-09-01 NOTE — ED Provider Notes (Signed)
[facility] DEPARTMENT Provider Note   CSN: [id number] Arrival date & time: 09/01/22  1000     History {Add pertinent medical, surgical, social history, OB history to HPI:1} Chief Complaint  Patient presents with   Knee Injury    [name] Gibson is a 81 y.o. male with *** presents with ***.   10:19 PM I was called into the room by bedside RN and internal medicine residents to assess an admitted patient in respiratory distress.   In brief, this is a    BP (!) 170/78   Pulse 98   Temp (!) 103.6 F (39.8 C) (Axillary)   Resp (!) 21   Ht '5\' 4"'$  (1.626 m)   Wt 83 kg   SpO2 98%   BMI 31.41 kg/m   Physical exam: Patient obtunded, responsive to voice intermittently, withdraws extremities to pain, sweaty and febrile to 103F.    CRITICAL CARE Performed by: Audley Hose Total critical care time: *** minutes Critical care time was exclusive of separately billable procedures and treating other patients. Critical care was necessary to treat or prevent imminent or life-threatening deterioration. Critical care was time spent personally by me on the following activities: development of treatment plan with patient and/or surrogate as well as nursing, discussions with consultants, evaluation of patient's response to treatment, examination of patient, obtaining history from patient or surrogate, ordering and performing treatments and interventions, ordering and review of laboratory studies, ordering and review of radiographic studies, pulse oximetry and re-evaluation of patient's condition.     HPI     Home Medications Prior to Admission medications   Medication Sig Start Date End Date Taking? Authorizing Provider  amLODipine (NORVASC) 10 MG tablet TAKE 1 TABLET(10 MG) BY MOUTH DAILY Patient taking differently: Take 10 mg by mouth daily. 07/04/22  Yes [name], DO  aspirin EC 325 MG tablet Take 325 mg by mouth every other day.   Yes  [provider]  atorvastatin (LIPITOR) 20 MG tablet Take 1 tablet (20 mg total) by mouth daily. 08/29/22  Yes [name], DO  bimatoprost (LUMIGAN) 0.01 % SOLN Place 1 drop into both eyes at bedtime.   Yes [provider]  chlorthalidone (HYGROTON) 25 MG tablet TAKE 1 TABLET(25 MG) BY MOUTH DAILY Patient taking differently: Take 25 mg by mouth daily. 04/07/22  Yes Lowne Chase, Yvonne R, DO  fluticasone (FLONASE) 50 MCG/ACT nasal spray SHAKE LIQUID AND USE 2 SPRAYS IN EACH NOSTRIL DAILY AS NEEDED FOR ALLERGIES Patient taking differently: Place 2 sprays into both nostrils daily as needed for allergies. 05/14/22  Yes [name], DO  losartan (COZAAR) 100 MG tablet Take 1 tablet (100 mg total) by mouth daily. 12/26/21  Yes [name], DO  metoprolol (TOPROL-XL) 200 MG 24 hr tablet TAKE 1 TABLET(200 MG) BY MOUTH DAILY Patient taking differently: Take 200 mg by mouth daily. 07/04/22  Yes [name], DO  Omega-3 Fatty Acids (FISH OIL) 1360 MG CAPS Take 1 capsule (1,360 mg total) by mouth daily. 09/28/18  Yes [name], DO  COVID-19 mRNA bivalent vaccine, Pfizer, injection Inject into the muscle. 08/29/21   Carlyle Basques, MD  COVID-19 mRNA Vac-TriS, Pfizer, (PFIZER-BIONT COVID-19 VAC-TRIS) SUSP injection Inject into the muscle. 04/29/21   Carlyle Basques, MD  tiZANidine (ZANAFLEX) 4 MG tablet Take 1 tablet (4 mg total) by mouth every 6 (six) hours as needed for muscle spasms. Patient not  taking: Reported on 09/01/2022 12/07/19   [name], DO  traZODone (DESYREL) 50 MG tablet Take 0.5-1 tablets (25-50 mg total) by mouth at bedtime as needed for sleep. Patient not taking: Reported on 09/01/2022 10/29/20   [name], DO  Zoster Vaccine Adjuvanted [facility]) injection Inject into the muscle. 03/31/22   Carlyle Basques, MD      Allergies    Penicillins    Review of Systems   Review of Systems Review of systems  positive/negative for ***.  A 10 point review of systems was performed and is negative unless otherwise reported in HPI.  Physical Exam Updated Vital Signs BP (!) 170/78   Pulse 98   Temp (!) 103.6 F (39.8 C) (Axillary)   Resp (!) 21   Ht '5\' 4"'$  (1.626 m)   Wt 83 kg   SpO2 98%   BMI 31.41 kg/m  Physical Exam General: Normal appearing ***male/male, lying in bed.  HEENT: PERRLA, Sclera anicteric, MMM, trachea midline. Cardiology: RRR, no murmurs/rubs/gallops. BL radial and DP pulses equal bilaterally.  Resp: Normal respiratory rate and effort. CTAB, no wheezes, rhonchi, crackles.  Abd: Soft, non-tender, non-distended. No rebound tenderness or guarding.  GU: Deferred. MSK: No peripheral edema or signs of trauma. Extremities without deformity or TTP. No cyanosis or clubbing. Skin: warm, dry. No rashes or lesions. Back: No CVA tenderness Neuro: A&Ox4, CNs II-XII grossly intact. MAEs. Sensation grossly intact.  Psych: Normal mood and affect.   ED Results / Procedures / Treatments   Labs (all labs ordered are listed, but only abnormal results are displayed) Labs Reviewed  COMPREHENSIVE METABOLIC PANEL - Abnormal; Notable for the following components:      Result Value   Potassium 2.8 (*)    Chloride 94 (*)    Glucose, Bld 165 (*)    BUN 77 (*)    Creatinine, Ser 2.50 (*)    Total Protein 6.4 (*)    Albumin 2.8 (*)    AST 241 (*)    ALT 107 (*)    GFR, Estimated 25 (*)    Anion gap 16 (*)    All other components within normal limits  CBC WITH DIFFERENTIAL/PLATELET - Abnormal; Notable for the following components:   WBC 12.4 (*)    RBC 3.85 (*)    Hemoglobin 12.6 (*)    HCT 36.0 (*)    Platelets 121 (*)    Neutro Abs 9.7 (*)    Monocytes Absolute 1.4 (*)    All other components within normal limits  MAGNESIUM - Abnormal; Notable for the following components:   Magnesium 2.8 (*)    All other components within normal limits  CK - Abnormal; Notable for the following  components:   Total CK 9,441 (*)    All other components within normal limits  PROTIME-INR - Abnormal; Notable for the following components:   Prothrombin Time 15.5 (*)    All other components within normal limits  CBG MONITORING, ED - Abnormal; Notable for the following components:   Glucose-Capillary 127 (*)    All other components within normal limits  I-STAT ARTERIAL BLOOD GAS, ED - Abnormal; Notable for the following components:   pH, Arterial 7.496 (*)    pO2, Arterial 278 (*)    Acid-Base Excess 6.0 (*)    Potassium 3.2 (*)    Calcium, Ion 1.12 (*)    HCT 38.0 (*)    Hemoglobin 12.9 (*)    All other components within normal  limits  TROPONIN I (HIGH SENSITIVITY) - Abnormal; Notable for the following components:   Troponin I (High Sensitivity) 113 (*)    All other components within normal limits  TROPONIN I (HIGH SENSITIVITY) - Abnormal; Notable for the following components:   Troponin I (High Sensitivity) 106 (*)    All other components within normal limits  RESP PANEL BY RT-PCR (FLU A&B, COVID) ARPGX2  CULTURE, BLOOD (ROUTINE X 2)  CULTURE, BLOOD (ROUTINE X 2)  URINE CULTURE  HEPATITIS PANEL, ACUTE  APTT  BASIC METABOLIC PANEL  TSH  RAPID URINE DRUG SCREEN, HOSP PERFORMED  URINALYSIS, ROUTINE W REFLEX MICROSCOPIC  COMPREHENSIVE METABOLIC PANEL  CBC  MAGNESIUM  HEMOGLOBIN A1C  BLOOD GAS, ARTERIAL  BASIC METABOLIC PANEL  CBC    EKG EKG Interpretation  Date/Time:  Monday September 01 2022 10:18:32 EDT Ventricular Rate:  71 PR Interval:  178 QRS Duration: 108 QT Interval:  398 QTC Calculation: 433 R Axis:   -56 Text Interpretation: Sinus rhythm Atrial premature complexes Left axis deviation ST elevation suggests acute pericarditis Early repolarization Confirmed by Georgina Snell [phone]) on 09/01/2022 10:37:51 AM  Procedures .Critical Care  Performed by: Audley Hose, MD Authorized by: Audley Hose, MD   Critical care provider statement:     Critical care time (minutes):  30   Critical care was time spent personally by me on the following activities:  Development of treatment plan with patient or surrogate, discussions with consultants, evaluation of patient's response to treatment, examination of patient, ordering and review of laboratory studies, ordering and review of radiographic studies, ordering and performing treatments and interventions, pulse oximetry, re-evaluation of patient's condition and review of old charts   {Document cardiac monitor, telemetry assessment procedure when appropriate:1}  Medications Ordered in ED Medications  enoxaparin (LOVENOX) injection 30 mg (has no administration in time range)  sodium chloride flush (NS) 0.9 % injection 3 mL (3 mLs Intravenous Not Given 09/01/22 2114)  lactated ringers bolus 500 mL (has no administration in time range)  acetaminophen (TYLENOL) suppository 650 mg (has no administration in time range)  acetaminophen (TYLENOL) 650 MG suppository (has no administration in time range)  etomidate (AMIDATE) injection (30 mg Intravenous Given 09/01/22 2209)  rocuronium (ZEMURON) injection (100 mg Intravenous Given 09/01/22 2210)  propofol (DIPRIVAN) 1000 MG/100ML infusion (has no administration in time range)  oxyCODONE-acetaminophen (PERCOCET/ROXICET) 5-325 MG per tablet 1 tablet (1 tablet Oral Given 09/01/22 1028)  aspirin chewable tablet 324 mg (324 mg Oral Given 09/01/22 1202)  potassium chloride SA (KLOR-CON M) CR tablet 60 mEq (60 mEq Oral Given 09/01/22 1204)  lactated ringers bolus 500 mL (0 mLs Intravenous Stopped 09/01/22 1413)  lactated ringers bolus 500 mL (0 mLs Intravenous Stopped 09/01/22 1508)  lactated ringers bolus 1,000 mL (1,000 mLs Intravenous New Bag/Given 09/01/22 2203)    ED Course/ Medical Decision Making/ A&P                          Medical Decision Making Amount and/or Complexity of Data Reviewed Labs: ordered. Radiology: ordered.  Risk OTC  drugs. Prescription drug management. Decision regarding hospitalization.    '@HNNMDM'$ @ ***  I have personally reviewed and interpreted all labs and imaging.     {Document critical care time when appropriate:1} {Document review of labs and clinical decision tools ie heart score, Chads2Vasc2 etc:1}  {Document your independent review of radiology images, and any outside records:1} {Document your discussion with family members, caretakers, and  with consultants:1} {Document social determinants of health affecting pt's care:1} {Document your decision making why or why not admission, treatments were needed:1} Final Clinical Impression(s) / ED Diagnoses Final diagnoses:  Fall, initial encounter  Acute pain of right knee  Transaminitis  AKI (acute kidney injury) ([place])  Hypermagnesemia    Rx / DC Orders ED Discharge Orders     None        This note was created using dictation software, which may contain spelling or grammatical errors.

## 2022-09-01 NOTE — Assessment & Plan Note (Addendum)
-   AM Mag check

## 2022-09-01 NOTE — ED Notes (Signed)
Transported to ct at this time ?

## 2022-09-01 NOTE — Assessment & Plan Note (Addendum)
-   PT/OT - Fall precautions - Has walker with him in hospital

## 2022-09-01 NOTE — ED Provider Notes (Signed)
[facility] EMERGENCY DEPARTMENT Provider Note   CSN: [id number] Arrival date & time: 09/01/22  1000     History  Chief Complaint  Patient presents with   Knee Injury    [name] Gibson is a 81 y.o. male.  With PMH of HTN, HLD, hypothyroidism who was brought in by EMS from home for generalized fatigue and weakness and ongoing pain in his right knee for the past 2 months that limited his ability to stand up or walk today from a fall that happened months ago.  He lives at home alone and does not use a walker or cane to ambulate.  He has had previous falls unsure when his last was and has hit his head but denies any loss of consciousness.  He denies being on any anticoagulation.  He mainly came in for continued pain in his right knee.  He intermittently takes aspirin he says for pain every 2 days.  He has had no fevers at home and is eating and drinking okay.  He developed a new nonproductive cough as well as nonbloody diarrhea.  He denies any abdominal pain, chest pain, shortness of breath or vomiting.  HPI     Home Medications Prior to Admission medications   Medication Sig Start Date End Date Taking? Authorizing Provider  amLODipine (NORVASC) 10 MG tablet TAKE 1 TABLET(10 MG) BY MOUTH DAILY 07/04/22   Carollee Herter, Alferd Apa, DO  aspirin EC 325 MG tablet Take 325 mg by mouth every other day.    [provider]  atorvastatin (LIPITOR) 20 MG tablet Take 1 tablet (20 mg total) by mouth daily. 08/29/22   Carollee Herter, Yvonne R, DO  bimatoprost (LUMIGAN) 0.01 % SOLN Lumigan 0.01 % eye drops    [provider]  chlorthalidone (HYGROTON) 25 MG tablet TAKE 1 TABLET(25 MG) BY MOUTH DAILY 04/07/22   Carollee Herter, Alferd Apa, DO  COVID-19 mRNA bivalent vaccine, Pfizer, injection Inject into the muscle. 08/29/21   Carlyle Basques, MD  COVID-19 mRNA Vac-TriS, Pfizer, (PFIZER-BIONT COVID-19 VAC-TRIS) SUSP injection Inject into the muscle. 04/29/21   Carlyle Basques, MD   fluticasone (FLONASE) 50 MCG/ACT nasal spray SHAKE LIQUID AND USE 2 SPRAYS IN EACH NOSTRIL DAILY AS NEEDED FOR ALLERGIES 05/14/22   Carollee Herter, Alferd Apa, DO  losartan (COZAAR) 100 MG tablet Take 1 tablet (100 mg total) by mouth daily. 12/26/21   [name], DO  metoprolol (TOPROL-XL) 200 MG 24 hr tablet TAKE 1 TABLET(200 MG) BY MOUTH DAILY 07/04/22   Carollee Herter, Alferd Apa, DO  Omega-3 Fatty Acids (FISH OIL) 1360 MG CAPS Take 1 capsule (1,360 mg total) by mouth daily. 09/28/18   [name], DO  RESTASIS 0.05 % ophthalmic emulsion  12/13/20   [provider]  RHOPRESSA 0.02 % SOLN Place 1 drop into both eyes daily.  09/09/18   [provider]  tiZANidine (ZANAFLEX) 4 MG tablet Take 1 tablet (4 mg total) by mouth every 6 (six) hours as needed for muscle spasms. 12/07/19   [name], DO  traZODone (DESYREL) 50 MG tablet Take 0.5-1 tablets (25-50 mg total) by mouth at bedtime as needed for sleep. 10/29/20   [name], DO  Zoster Vaccine Adjuvanted [facility]) injection Inject into the muscle. 03/31/22   Carlyle Basques, MD      Allergies    Penicillins    Review of Systems   Review of Systems  Physical Exam Updated Vital  Signs BP (!) 90/57   Pulse 69   Temp 97.7 F (36.5 C) (Oral)   Resp (!) 22   Ht '5\' 4"'$  (1.626 m)   Wt 83 kg   SpO2 94%   BMI 31.41 kg/m  Physical Exam Constitutional: Alert and oriented.  Pleasant no acute distress, nontoxic Eyes: Conjunctivae are normal. ENT      Head: Normocephalic and atraumatic.      Nose: No congestion.      Mouth/Throat: Mucous membranes are moist.      Neck: No stridor. Cardiovascular: S1, S2, equal palpable radial pulses, warm and dry Respiratory: Normal respiratory effort. Breath sounds are normal. Gastrointestinal: Soft and nontender.  Musculoskeletal: Normal range of motion in all extremities.      Right lower leg: No edema.  Palpable DP pulses.  Pain with anterior palpation of  right knee but there is no external evidence of injury, no ecchymoses, no edema, no effusion.  No erythema or warmth.  No pain with logroll of right lower extremity.      Left lower leg: No tenderness or edema. Neurologic: Normal speech and language.  Equal strength of bilateral upper and lower extremities.  PERRL.  EOMI.  No facial droop.  Tongue midline.  Sensation grossly intact.  No gross focal neurologic deficits are appreciated. Skin: Skin is warm, dry and intact. No rash noted. Psychiatric: Mood and affect are normal. Speech and behavior are normal.  ED Results / Procedures / Treatments   Labs (all labs ordered are listed, but only abnormal results are displayed) Labs Reviewed  COMPREHENSIVE METABOLIC PANEL - Abnormal; Notable for the following components:      Result Value   Potassium 2.8 (*)    Chloride 94 (*)    Glucose, Bld 165 (*)    BUN 77 (*)    Creatinine, Ser 2.50 (*)    Total Protein 6.4 (*)    Albumin 2.8 (*)    AST 241 (*)    ALT 107 (*)    GFR, Estimated 25 (*)    Anion gap 16 (*)    All other components within normal limits  CBC WITH DIFFERENTIAL/PLATELET - Abnormal; Notable for the following components:   WBC 12.4 (*)    RBC 3.85 (*)    Hemoglobin 12.6 (*)    HCT 36.0 (*)    Platelets 121 (*)    Neutro Abs 9.7 (*)    Monocytes Absolute 1.4 (*)    All other components within normal limits  MAGNESIUM - Abnormal; Notable for the following components:   Magnesium 2.8 (*)    All other components within normal limits  CK - Abnormal; Notable for the following components:   Total CK 9,441 (*)    All other components within normal limits  PROTIME-INR - Abnormal; Notable for the following components:   Prothrombin Time 15.5 (*)    All other components within normal limits  TROPONIN I (HIGH SENSITIVITY) - Abnormal; Notable for the following components:   Troponin I (High Sensitivity) 113 (*)    All other components within normal limits  TROPONIN I (HIGH  SENSITIVITY) - Abnormal; Notable for the following components:   Troponin I (High Sensitivity) 106 (*)    All other components within normal limits  RESP PANEL BY RT-PCR (FLU A&B, COVID) ARPGX2  HEPATITIS PANEL, ACUTE  APTT  BASIC METABOLIC PANEL  TSH  RAPID URINE DRUG SCREEN, HOSP PERFORMED  URINALYSIS, ROUTINE W REFLEX MICROSCOPIC  COMPREHENSIVE METABOLIC PANEL  CBC  MAGNESIUM  HEMOGLOBIN A1C    EKG EKG Interpretation  Date/Time:  Monday September 01 2022 10:18:32 EDT Ventricular Rate:  71 PR Interval:  178 QRS Duration: 108 QT Interval:  398 QTC Calculation: 433 R Axis:   -56 Text Interpretation: Sinus rhythm Atrial premature complexes Left axis deviation ST elevation suggests acute pericarditis Early repolarization Confirmed by Georgina Snell [phone]) on 09/01/2022 10:37:51 AM  Radiology CT CHEST ABDOMEN PELVIS WO CONTRAST  Result Date: 09/01/2022 CLINICAL DATA:  Acute kidney injury. Elevated liver enzymes. Diarrhea. EXAM: CT CHEST, ABDOMEN AND PELVIS WITHOUT CONTRAST TECHNIQUE: Multidetector CT imaging of the chest, abdomen and pelvis was performed following the standard protocol without IV contrast. RADIATION DOSE REDUCTION: This exam was performed according to the departmental dose-optimization program which includes automated exposure control, adjustment of the mA and/or kV according to patient size and/or use of iterative reconstruction technique. COMPARISON:  None Available. FINDINGS: CT CHEST FINDINGS Cardiovascular: Heart size is mildly enlarged. No pericardial effusion. Aortic atherosclerosis and coronary artery calcifications. Mediastinum/Nodes: Thyroid gland, trachea, and esophagus are unremarkable. No enlarged axillary or mediastinal lymph nodes. Extensive calcified mediastinal and hilar lymph nodes are identified compatible with prior granulomatous disease. Lungs/Pleura: There is no pleural effusion, airspace consolidation, atelectasis, or pneumothorax. Scarring noted  within the subpleural right middle lobe and posteromedial left upper lobe. No suspicious pulmonary nodule or mass. Musculoskeletal: There is degenerative changes identified at the right sternoclavicular joint with mild anterior subluxation of the clavicle, likely chronic, image 8/3. Multilevel spondylosis within the thoracic spine. CT ABDOMEN PELVIS FINDINGS Hepatobiliary: Within the right lobe of liver there is an ill-defined hypodense mass measuring approximately 4.4 by 4.8 cm, image 56/3. gallbladder is unremarkable. Pancreas: Unremarkable. No pancreatic ductal dilatation or surrounding inflammatory changes. Spleen: Normal in size without focal abnormality. Adrenals/Urinary Tract: Normal appearance of the adrenal glands. Marked, bilateral perinephric fat stranding is identified. No nephrolithiasis, hydronephrosis or mass. Stomach/Bowel: Evaluation of bowel pathology is diminished due to lack of IV contrast material. There is also significantly diminished enteric contrast material further limiting exam. No pathologic dilatation of the large or small bowel loops. Diffuse colonic diverticulosis noted without signs of acute diverticulitis. Vascular/Lymphatic: Aortic atherosclerosis. No enlarged abdominal or pelvic lymph nodes. Reproductive: Multiple brachytherapy seeds identified within the prostate gland Other: No ascites or focal fluid collections. Musculoskeletal: No acute or suspicious osseous findings. Multilevel degenerative disc disease noted. IMPRESSION: 1. There is an ill-defined hypodense mass within the right lobe of liver. This may represent a benign or malignant neoplasm. When the patient is clinically stable and able to follow directions and hold their breath (preferably as an outpatient) further evaluation with dedicated abdominal MRI with contrast material should be considered. 2. Marked, bilateral perinephric fat stranding. Findings are nonspecific they may be seen with acute or chronic renal insult.  No hydronephrosis identified. 3. Prior granulomatous disease. 4. Aortic Atherosclerosis (ICD10-I70.0). 5. Coronary artery calcifications. Electronically Signed   By: Kerby Moors M.D.   On: 09/01/2022 12:54   CT Head Wo Contrast  Result Date: 09/01/2022 CLINICAL DATA:  Provided history: Fall. EXAM: CT HEAD WITHOUT CONTRAST CT CERVICAL SPINE WITHOUT CONTRAST TECHNIQUE: Multidetector CT imaging of the head and cervical spine was performed following the standard protocol without intravenous contrast. Multiplanar CT image reconstructions of the cervical spine were also generated. RADIATION DOSE REDUCTION: This exam was performed according to the departmental dose-optimization program which includes automated exposure control, adjustment of the mA and/or kV according to patient size and/or use of iterative reconstruction  technique. COMPARISON:  CT of the head and maxillofacial structures 09/10/2018. Brain MRI 02/14/2016. Radiographs of the cervical spine 01/13/2007. FINDINGS: CT HEAD FINDINGS Brain: Mild parenchymal atrophy. Mild patchy and ill-defined hypoattenuation within the cerebral white matter, nonspecific but compatible with chronic small vessel ischemic disease. There is no acute intracranial hemorrhage. No demarcated cortical infarct. No extra-axial fluid collection. No evidence of an intracranial mass. No midline shift. Vascular: No hyperdense vessel. Atherosclerotic calcifications. Skull: No fracture or aggressive osseous lesion. Sinuses/Orbits: No mass or acute finding within the imaged orbits. No significant paranasal sinus disease at the imaged levels. CT CERVICAL SPINE FINDINGS Alignment: Straightening of the expected cervical lordosis. Slight C3-C4 grade 1 anterolisthesis. Slight C5-C6 and C6-C7 grade 1 retrolisthesis. Slight C7-T1 grade 1 anterolisthesis. Skull base and vertebrae: The basion-dental and atlanto-dental intervals are maintained.No evidence of acute fracture to the cervical spine.  Soft tissues and spinal canal: No prevertebral fluid or swelling. No visible canal hematoma. Disc levels: Subtle Oasis with multilevel disc space narrowing, disc bulges/central disc protrusions, endplate spurring, uncovertebral hypertrophy and facet arthrosis. Disc space narrowing is greatest at C5-C6, C6-C7 and T2-T3 (advanced at these levels). No appreciable high-grade spinal canal stenosis. Multilevel bony neural foraminal narrowing. Upper chest: No consolidation within the imaged lung apices. No visible pneumothorax. IMPRESSION: CT head: 1. No evidence of acute intracranial abnormality. 2. Mild chronic small vessel ischemic changes within the cerebral white matter. 3. Mild parenchymal atrophy. CT cervical spine: 1. No evidence of acute fracture to the cervical spine. 2. Nonspecific straightening of the expected cervical lordosis. 3. Mild multilevel grade 1 spondylolisthesis, as detailed. 4. Cervical spondylosis, as described. Electronically Signed   By: Kellie Simmering D.O.   On: 09/01/2022 11:08   CT Cervical Spine Wo Contrast  Result Date: 09/01/2022 CLINICAL DATA:  Provided history: Fall. EXAM: CT HEAD WITHOUT CONTRAST CT CERVICAL SPINE WITHOUT CONTRAST TECHNIQUE: Multidetector CT imaging of the head and cervical spine was performed following the standard protocol without intravenous contrast. Multiplanar CT image reconstructions of the cervical spine were also generated. RADIATION DOSE REDUCTION: This exam was performed according to the departmental dose-optimization program which includes automated exposure control, adjustment of the mA and/or kV according to patient size and/or use of iterative reconstruction technique. COMPARISON:  CT of the head and maxillofacial structures 09/10/2018. Brain MRI 02/14/2016. Radiographs of the cervical spine 01/13/2007. FINDINGS: CT HEAD FINDINGS Brain: Mild parenchymal atrophy. Mild patchy and ill-defined hypoattenuation within the cerebral white matter, nonspecific but  compatible with chronic small vessel ischemic disease. There is no acute intracranial hemorrhage. No demarcated cortical infarct. No extra-axial fluid collection. No evidence of an intracranial mass. No midline shift. Vascular: No hyperdense vessel. Atherosclerotic calcifications. Skull: No fracture or aggressive osseous lesion. Sinuses/Orbits: No mass or acute finding within the imaged orbits. No significant paranasal sinus disease at the imaged levels. CT CERVICAL SPINE FINDINGS Alignment: Straightening of the expected cervical lordosis. Slight C3-C4 grade 1 anterolisthesis. Slight C5-C6 and C6-C7 grade 1 retrolisthesis. Slight C7-T1 grade 1 anterolisthesis. Skull base and vertebrae: The basion-dental and atlanto-dental intervals are maintained.No evidence of acute fracture to the cervical spine. Soft tissues and spinal canal: No prevertebral fluid or swelling. No visible canal hematoma. Disc levels: Subtle Oasis with multilevel disc space narrowing, disc bulges/central disc protrusions, endplate spurring, uncovertebral hypertrophy and facet arthrosis. Disc space narrowing is greatest at C5-C6, C6-C7 and T2-T3 (advanced at these levels). No appreciable high-grade spinal canal stenosis. Multilevel bony neural foraminal narrowing. Upper chest: No consolidation within the  imaged lung apices. No visible pneumothorax. IMPRESSION: CT head: 1. No evidence of acute intracranial abnormality. 2. Mild chronic small vessel ischemic changes within the cerebral white matter. 3. Mild parenchymal atrophy. CT cervical spine: 1. No evidence of acute fracture to the cervical spine. 2. Nonspecific straightening of the expected cervical lordosis. 3. Mild multilevel grade 1 spondylolisthesis, as detailed. 4. Cervical spondylosis, as described. Electronically Signed   By: Kellie Simmering D.O.   On: 09/01/2022 11:08   DG Pelvis 1-2 Views  Result Date: 09/01/2022 CLINICAL DATA:  81 year old male with cough, sepsis. Fell 2 months ago with  continued pain. History of prostate cancer. EXAM: PELVIS - 1-2 VIEW COMPARISON:  None Available. FINDINGS: Portable AP supine view at at 1035 hours. Sequelae of prostate brachytherapy. Calcified iliofemoral atherosclerosis. Bone mineralization is within normal limits. Pelvis appears intact. SI joints appear symmetric. Femoral heads appear normally located. Grossly intact proximal femurs. No acute osseous abnormality identified. Negative visible bowel gas. IMPRESSION: Prior prostate brachytherapy. No acute or suspicious osseous abnormality identified about the pelvis. Electronically Signed   By: Genevie Ann M.D.   On: 09/01/2022 10:59   DG Knee Complete 4 Views Right  Result Date: 09/01/2022 CLINICAL DATA:  81 year old male with cough, sepsis. Fell 2 months ago with continued pain. EXAM: RIGHT KNEE - COMPLETE 4+ VIEW COMPARISON:  Right knee series 09/10/2018. FINDINGS: Bone mineralization is within normal limits. Calcified peripheral vascular disease. No joint effusion on the cross-table lateral view. Patella appears stable and intact. Joint spaces and alignment are normal for age. No acute osseous abnormality identified. Anterior soft tissue swelling has resolved since 2019. IMPRESSION: 1. Negative for age radiographic appearance of the right knee. 2. Calcified peripheral vascular disease. Electronically Signed   By: Genevie Ann M.D.   On: 09/01/2022 10:57   DG Chest Portable 1 View  Result Date: 09/01/2022 CLINICAL DATA:  81 year old male with cough, sepsis. EXAM: PORTABLE CHEST 1 VIEW COMPARISON:  Chest radiographs 11/09/2017 and earlier. FINDINGS: Portable AP upright view at 1032 hours. Cardiomegaly appears mildly progressed since 2018. Chronic tortuosity of the thoracic aorta is stable. Visualized tracheal air column is within normal limits. Allowing for portable technique the lungs are clear. No pneumothorax or pleural effusion. No acute osseous abnormality identified. Negative visible bowel gas. IMPRESSION:  Cardiomegaly.  No acute cardiopulmonary abnormality. Electronically Signed   By: Genevie Ann M.D.   On: 09/01/2022 10:56    Procedures Procedures  Remain on constant cardiac monitoring, normal sinus rhythm normal rate  Medications Ordered in ED Medications  enoxaparin (LOVENOX) injection 30 mg (has no administration in time range)  sodium chloride flush (NS) 0.9 % injection 3 mL (3 mLs Intravenous Not Given 09/01/22 1451)  oxyCODONE-acetaminophen (PERCOCET/ROXICET) 5-325 MG per tablet 1 tablet (1 tablet Oral Given 09/01/22 1028)  aspirin chewable tablet 324 mg (324 mg Oral Given 09/01/22 1202)  potassium chloride SA (KLOR-CON M) CR tablet 60 mEq (60 mEq Oral Given 09/01/22 1204)  lactated ringers bolus 500 mL (0 mLs Intravenous Stopped 09/01/22 1413)  lactated ringers bolus 500 mL (0 mLs Intravenous Stopped 09/01/22 1508)    ED Course/ Medical Decision Making/ A&P Clinical Course as of 09/01/22 1827  Mon Sep 01, 2022  1212 Patient has multiple electrolyte abnormalities and findings on work-up.  His abnormal heart rhythm with intermittent PACs was likely due to acute electrolyte abnormalities.  He has hypermagnesemia 2.8 with hypokalemia 2.8.  He has a AKI creatinine 2.5 with elevated BUN to creatinine ratio approximately  30.  Would suggest that it is prerenal as he is endorsing diarrhea but also consider underlying possible heart failure.  He has new cardiomegaly on his chest x-ray but no pitting edema of his lower extremities.  He has a new transaminitis AST 241 ALT 107.  He has no abdominal pain on exam and low suspicion for acute hepatitis or cholecystitis but will obtain CT chest abdomen pelvis without contrast for further evaluation.  Ordered for hepatitis panel.  He does not take acetaminophen and he said he last drank 3 months ago.  He is leukocytosis 12.4 and new anemia 12.6 with thrombocytopenia 121 which could be related to underlying liver disease.  I have ordered for IV fluids 500 cc bolus due  to concern for possible associated heart failure, potassium repletion and aspirin for new elevated troponin 113 which I suspect is all demand ischemia.  He has no active chest pain or shortness of breath [VB]  1420 CT chest abdomen pelvis showed ill-defined mass of the liver which likely could suggest new liver cancer.  He also has bilateral perinephric stranding with pending UA.  I have discussed case with medicine team who will be down to evaluate the patient for admission. [VB]    Clinical Course User Index [VB] Elgie Congo, MD                           Medical Decision Making [place] Dietrich Gibson is a 81 y.o. male.  With PMH of HTN, HLD, hypothyroidism who was brought in by EMS from home for generalized fatigue and weakness and ongoing pain in his right knee for the past 2 months that limited his ability to stand up or walk today from a fall that happened months ago.   Patient describes overall weakness or fatigue, rather than any particular focal weakness.    Regarding patient's generalized weakness, differential is broad and includes but is not limited to acute electrolyte disturbances (such as hypokalemia, hypoglycemia, hypoMg, hypoCa),  infection mainly concern for possible pneumonia versus URI or UTI with new cough and diarrhea, anemia, dehydration among multiple other etiologies. Intact neurologic exam and chronic nature of symptoms make CNS etiology such as stroke, tumor unlikely.    Cardiac etiologies such as arrhythmia or atypical ACS will be evaluated with EKG and troponin; anemia will be ruled out with CBC; infection will be ruled out with basic infectious workup (CXR, urinalysis); acute electrolyte abnormalities ruled out with serum studies.  IVF for dehydration.    Plan to obtain CBC, Chem 10, CK, EKG, troponin, UA, CXR.  Will obtain CT head, CT C-spine and knee x-ray to further evaluate for any evidence of traumatic injury.  There were no obvious injuries on exam.    We will  give 1 dose of Percocet for right knee pain.  Regarding right knee pain, leg is neurovascularly intact.  There is no obvious injury.  There is no skin changes concerning for cellulitis.  With palpable pulses, no concern for ischemic limb.  Disposition pending workup and reassessment.      Amount and/or Complexity of Data Reviewed Labs: ordered. Radiology: ordered.  Risk OTC drugs. Prescription drug management. Decision regarding hospitalization.    Final Clinical Impression(s) / ED Diagnoses Final diagnoses:  Fall, initial encounter  Acute pain of right knee  Transaminitis  AKI (acute kidney injury) ([place])  Hypermagnesemia    Rx / DC Orders ED Discharge Orders     None  Elgie Congo, MD 09/01/22 [phone]

## 2022-09-01 NOTE — ED Notes (Signed)
Resp at bedside at this time 

## 2022-09-01 NOTE — ED Notes (Signed)
Returned from ct at this time ?

## 2022-09-01 NOTE — ED Notes (Signed)
Admit team at bedside.

## 2022-09-01 NOTE — ED Notes (Signed)
Spoke to admit doctor reference pt being non-verbal, tremors, axillary temp of 103.5, decrease o2 sats and on [place] 3lpm and is tachypnea

## 2022-09-01 NOTE — Progress Notes (Signed)
FMTS Interim Progress Note  Received page from the nurse that patient no longer alert and requiring additional oxygen supplementation. Immediately went to bedside to assess patient alongside Dr. Sabra Heck. Upon arrival, patient on nonrebreathing mask and not responding to verbal or tactile stimuli. Respiratory therapy and nurse also at bedside. Given recent changes in mentation, ordered ABG which was overall appropriate. Patient not appropriate for BiPAP as he is not equipped to protect his own airway at this time. Vitals appropriate with mask other than febrile to 103.6. Concerning that patient is in continued respiratory distress in spite of appropriate oxygen saturations. S/p 1L bolus LR, no history of heart failure. Prior EF 62%. Concern for sepsis given initial vitals prior to being placed on nonrebreathing mask. Ordered, blood and urine cultures along with BMP and CBC. Repeat CXR notable for hypoventilatory changes with cardiomegaly with possible opacity on the left side. Planned to start broad spectrum antibiotics once blood cultures obtained x2. Ordered for 500 cc bolus, will likely need mIVF LR. High suspicion for PE given earlier leg pain on admission in the setting of abrupt escalation of oxygen requirement. As BiPAP is not appropriate, patient meets criteria for intubation. Consulted CCM and spoke with on-call provider Dr. Ruthann Cancer. Patient successfully intubated by ED provider Dr. Mayra Neer. PA with CCM arrived at bedside, patient will transfer to ICU. Patient noted to later exhibit abnormal jerking movements of upper extremities bilaterally, concern for possible seizure. Consulted neurology at the request of CCM, Dr. Leonel Ramsay at bedside as well. Discussed with overnight attending Dr. McDiarmid who agrees with plan. Appreciate assistance of ED and CCM providers. FPTS will resume care once patient stable for the medical floor.      Donney Dice, DO 09/01/2022, 10:30 PM PGY-3, [place] [facility] pager [phone]

## 2022-09-01 NOTE — Assessment & Plan Note (Deleted)
Home medications include losartan, amlodipine, metoprolol, and chlorthalidone. Hypotensive and bradycardic on admission.  - Hold home BP meds - Continuous telemetry - Vitals per routine - IVF bolus can be given if MAP <60

## 2022-09-01 NOTE — Procedures (Signed)
Indication: AMS with fever  Risks of the procedure were dicussed with the patient including post-LP headache, bleeding, infection, weakness/numbness of legs(radiculopathy), death.  The patient/patient's proxy agreed and written consent was obtained.   The patient was prepped and draped, and using sterile technique a 20 gauge quinke spinal needle was inserted in the L4-5 space. The opening pressure was not measure. Approximately 9 cc of CSF were obtained and sent for analysis.   Roland Rack, MD [facility] [phone]  If 7pm- 7am, please page neurology on call as listed in [place].

## 2022-09-01 NOTE — ED Notes (Signed)
Received verbal report from Kuch Y RN at this time 

## 2022-09-02 ENCOUNTER — Inpatient Hospital Stay (HOSPITAL_COMMUNITY): Payer: Medicare Other

## 2022-09-02 DIAGNOSIS — J9601 Acute respiratory failure with hypoxia: Secondary | ICD-10-CM | POA: Diagnosis not present

## 2022-09-02 DIAGNOSIS — R52 Pain, unspecified: Secondary | ICD-10-CM | POA: Diagnosis not present

## 2022-09-02 DIAGNOSIS — E440 Moderate protein-calorie malnutrition: Secondary | ICD-10-CM | POA: Insufficient documentation

## 2022-09-02 DIAGNOSIS — J9811 Atelectasis: Secondary | ICD-10-CM

## 2022-09-02 DIAGNOSIS — R569 Unspecified convulsions: Secondary | ICD-10-CM

## 2022-09-02 DIAGNOSIS — R0609 Other forms of dyspnea: Secondary | ICD-10-CM

## 2022-09-02 DIAGNOSIS — G9340 Encephalopathy, unspecified: Secondary | ICD-10-CM | POA: Diagnosis not present

## 2022-09-02 DIAGNOSIS — N179 Acute kidney failure, unspecified: Secondary | ICD-10-CM | POA: Diagnosis not present

## 2022-09-02 DIAGNOSIS — I959 Hypotension, unspecified: Secondary | ICD-10-CM | POA: Diagnosis not present

## 2022-09-02 LAB — MAGNESIUM
Magnesium: 2.8 mg/dL — ABNORMAL HIGH (ref 1.7–2.4)
Magnesium: 2.5 mg/dL — ABNORMAL HIGH (ref 1.7–2.4)

## 2022-09-02 LAB — GLUCOSE, CAPILLARY
Glucose-Capillary: 130 mg/dL — ABNORMAL HIGH (ref 70–99)
Glucose-Capillary: 145 mg/dL — ABNORMAL HIGH (ref 70–99)
Glucose-Capillary: 158 mg/dL — ABNORMAL HIGH (ref 70–99)
Glucose-Capillary: 159 mg/dL — ABNORMAL HIGH (ref 70–99)
Glucose-Capillary: 165 mg/dL — ABNORMAL HIGH (ref 70–99)
Glucose-Capillary: 183 mg/dL — ABNORMAL HIGH (ref 70–99)
Glucose-Capillary: 185 mg/dL — ABNORMAL HIGH (ref 70–99)

## 2022-09-02 LAB — URINALYSIS, ROUTINE W REFLEX MICROSCOPIC
Bilirubin Urine: NEGATIVE
Glucose, UA: 250 mg/dL — AB
Ketones, ur: NEGATIVE mg/dL
Leukocytes,Ua: NEGATIVE
Nitrite: NEGATIVE
Protein, ur: 100 mg/dL — AB
Specific Gravity, Urine: 1.015 (ref 1.005–1.030)
pH: 6 (ref 5.0–8.0)

## 2022-09-02 LAB — CSF CELL COUNT WITH DIFFERENTIAL
RBC Count, CSF: 1 /mm3 — ABNORMAL HIGH
Tube #: 3
WBC, CSF: 2 /mm3 (ref 0–5)

## 2022-09-02 LAB — COMPREHENSIVE METABOLIC PANEL
ALT: 103 U/L — ABNORMAL HIGH (ref 0–44)
AST: 207 U/L — ABNORMAL HIGH (ref 15–41)
Albumin: 2.1 g/dL — ABNORMAL LOW (ref 3.5–5.0)
Alkaline Phosphatase: 78 U/L (ref 38–126)
Anion gap: 4 — ABNORMAL LOW (ref 5–15)
BUN: 50 mg/dL — ABNORMAL HIGH (ref 8–23)
CO2: 28 mmol/L (ref 22–32)
Calcium: 7.7 mg/dL — ABNORMAL LOW (ref 8.9–10.3)
Chloride: 107 mmol/L (ref 98–111)
Creatinine, Ser: 1.33 mg/dL — ABNORMAL HIGH (ref 0.61–1.24)
GFR, Estimated: 53 mL/min — ABNORMAL LOW (ref >60)
Glucose, Bld: 175 mg/dL — ABNORMAL HIGH (ref 70–99)
Potassium: 2.7 mmol/L — CL (ref 3.5–5.1)
Sodium: 139 mmol/L (ref 135–145)
Total Bilirubin: 0.9 mg/dL (ref 0.3–1.2)
Total Protein: 5.3 g/dL — ABNORMAL LOW (ref 6.5–8.1)

## 2022-09-02 LAB — BLOOD CULTURE ID PANEL (REFLEXED) - BCID2

## 2022-09-02 LAB — POCT I-STAT 7, (LYTES, BLD GAS, ICA,H+H)
Acid-Base Excess: 1 mmol/L (ref 0.0–2.0)
Bicarbonate: 25.9 mmol/L (ref 20.0–28.0)
Calcium, Ion: 1.19 mmol/L (ref 1.15–1.40)
HCT: 33 % — ABNORMAL LOW (ref 39.0–52.0)
Hemoglobin: 11.2 g/dL — ABNORMAL LOW (ref 13.0–17.0)
O2 Saturation: 97 %
Patient temperature: 102.2
Potassium: 2.6 mmol/L — CL (ref 3.5–5.1)
Sodium: 140 mmol/L (ref 135–145)
TCO2: 27 mmol/L (ref 22–32)
pCO2 arterial: 47.1 mmHg (ref 32–48)
pH, Arterial: 7.356 (ref 7.35–7.45)
pO2, Arterial: 109 mmHg — ABNORMAL HIGH (ref 83–108)

## 2022-09-02 LAB — PROTEIN AND GLUCOSE, CSF
Glucose, CSF: 107 mg/dL — ABNORMAL HIGH (ref 40–70)
Total  Protein, CSF: 27 mg/dL (ref 15–45)

## 2022-09-02 LAB — URINE CULTURE: Culture: NO GROWTH

## 2022-09-02 LAB — ECHOCARDIOGRAM COMPLETE
AR max vel: 2.82 cm2
AV Area VTI: 2.76 cm2
AV Area mean vel: 2.7 cm2
AV Mean grad: 1 mmHg
AV Peak grad: 2.7 mmHg
Ao pk vel: 0.83 m/s
Area-P 1/2: 3.93 cm2
Height: 64 in
S' Lateral: 2.7 cm
Weight: 2723.12 oz

## 2022-09-02 LAB — BASIC METABOLIC PANEL
Anion gap: 8 (ref 5–15)
BUN: 36 mg/dL — ABNORMAL HIGH (ref 8–23)
CO2: 27 mmol/L (ref 22–32)
Calcium: 8.5 mg/dL — ABNORMAL LOW (ref 8.9–10.3)
Chloride: 104 mmol/L (ref 98–111)
Creatinine, Ser: 0.99 mg/dL (ref 0.61–1.24)
GFR, Estimated: >60 mL/min (ref >60)
Glucose, Bld: 151 mg/dL — ABNORMAL HIGH (ref 70–99)
Potassium: 3.2 mmol/L — ABNORMAL LOW (ref 3.5–5.1)
Sodium: 139 mmol/L (ref 135–145)

## 2022-09-02 LAB — URINALYSIS, MICROSCOPIC (REFLEX): Bacteria, UA: NONE SEEN

## 2022-09-02 LAB — CBC
HCT: 29.7 % — ABNORMAL LOW (ref 39.0–52.0)
Hemoglobin: 10.7 g/dL — ABNORMAL LOW (ref 13.0–17.0)
MCH: 33.4 pg (ref 26.0–34.0)
MCHC: 36 g/dL (ref 30.0–36.0)
MCV: 92.8 fL (ref 80.0–100.0)
Platelets: 93 10*3/uL — ABNORMAL LOW (ref 150–400)
RBC: 3.2 MIL/uL — ABNORMAL LOW (ref 4.22–5.81)
RDW: 14.6 % (ref 11.5–15.5)
WBC: 11.3 10*3/uL — ABNORMAL HIGH (ref 4.0–10.5)
nRBC: 0 % (ref 0.0–0.2)

## 2022-09-02 LAB — SALICYLATE LEVEL: Salicylate Lvl: <7 mg/dL — ABNORMAL LOW (ref 7.0–30.0)

## 2022-09-02 LAB — RAPID URINE DRUG SCREEN, HOSP PERFORMED
Amphetamines: NOT DETECTED
Barbiturates: NOT DETECTED
Benzodiazepines: NOT DETECTED
Cocaine: NOT DETECTED
Opiates: NOT DETECTED
Tetrahydrocannabinol: NOT DETECTED

## 2022-09-02 LAB — HEMOGLOBIN A1C
Hgb A1c MFr Bld: 6.1 % — ABNORMAL HIGH (ref 4.8–5.6)
Mean Plasma Glucose: 128.37 mg/dL

## 2022-09-02 LAB — MRSA NEXT GEN BY PCR, NASAL: MRSA by PCR Next Gen: NOT DETECTED

## 2022-09-02 LAB — TSH: TSH: 0.997 u[IU]/mL (ref 0.350–4.500)

## 2022-09-02 LAB — PHOSPHORUS
Phosphorus: 2.4 mg/dL — ABNORMAL LOW (ref 2.5–4.6)
Phosphorus: 1.8 mg/dL — ABNORMAL LOW (ref 2.5–4.6)

## 2022-09-02 LAB — TROPONIN I (HIGH SENSITIVITY): Troponin I (High Sensitivity): 106 ng/L (ref <18)

## 2022-09-02 LAB — ACETAMINOPHEN LEVEL: Acetaminophen (Tylenol), Serum: <10 ug/mL — ABNORMAL LOW (ref 10–30)

## 2022-09-02 MED ORDER — FENTANYL 2500MCG IN NS 250ML (10MCG/ML) PREMIX INFUSION
0.0000 ug/h | INTRAVENOUS | Status: DC
  Administered 2022-09-02: 25 ug/h via INTRAVENOUS
  Filled 2022-09-02: qty 250

## 2022-09-02 MED ORDER — POTASSIUM CHLORIDE 10 MEQ/50ML IV SOLN
10.0000 meq | INTRAVENOUS | Status: AC
  Administered 2022-09-02 (×6): 10 meq via INTRAVENOUS
  Filled 2022-09-02 (×6): qty 50

## 2022-09-02 MED ORDER — CALCIUM GLUCONATE-NACL 1-0.675 GM/50ML-% IV SOLN
1.0000 g | Freq: Once | INTRAVENOUS | Status: DC
  Filled 2022-09-02: qty 50

## 2022-09-02 MED ORDER — SODIUM CHLORIDE 0.9 % IV SOLN: INTRAVENOUS | Status: DC | PRN

## 2022-09-02 MED ORDER — SODIUM CHLORIDE 0.9 % IV SOLN
2.0000 g | Freq: Two times a day (BID) | INTRAVENOUS | Status: DC
  Administered 2022-09-02 – 2022-09-03 (×3): 2 g via INTRAVENOUS
  Filled 2022-09-02 (×4): qty 12.5

## 2022-09-02 MED ORDER — VITAL AF 1.2 CAL PO LIQD
1000.0000 mL | ORAL | Status: DC
  Administered 2022-09-02 – 2022-09-03 (×2): 1000 mL

## 2022-09-02 MED ORDER — SODIUM CHLORIDE 0.9% FLUSH: 10.0000 mL | INTRAVENOUS | Status: DC | PRN

## 2022-09-02 MED ORDER — ACETAMINOPHEN 325 MG PO TABS: 650.0000 mg | ORAL_TABLET | Freq: Four times a day (QID) | ORAL | Status: DC | PRN

## 2022-09-02 MED ORDER — POTASSIUM PHOSPHATES 15 MMOLE/5ML IV SOLN
30.0000 mmol | Freq: Once | INTRAVENOUS | Status: AC
  Administered 2022-09-02: 30 mmol via INTRAVENOUS
  Filled 2022-09-02: qty 10

## 2022-09-02 MED ORDER — ORAL CARE MOUTH RINSE: 15.0000 mL | OROMUCOSAL | Status: DC | PRN

## 2022-09-02 MED ORDER — PANTOPRAZOLE SODIUM 40 MG IV SOLR
40.0000 mg | INTRAVENOUS | Status: DC
  Administered 2022-09-02 – 2022-09-03 (×2): 40 mg via INTRAVENOUS
  Filled 2022-09-02 (×2): qty 10

## 2022-09-02 MED ORDER — SODIUM CHLORIDE 0.9% FLUSH
10.0000 mL | Freq: Two times a day (BID) | INTRAVENOUS | Status: DC
  Administered 2022-09-02 – 2022-09-03 (×3): 10 mL
  Administered 2022-09-03 – 2022-09-04 (×2): 30 mL

## 2022-09-02 MED ORDER — MAGNESIUM SULFATE 2 GM/50ML IV SOLN: 2.0000 g | Freq: Once | INTRAVENOUS | Status: DC

## 2022-09-02 MED ORDER — PERFLUTREN LIPID MICROSPHERE
1.0000 mL | INTRAVENOUS | Status: AC | PRN
  Administered 2022-09-02: 2 mL via INTRAVENOUS

## 2022-09-02 MED ORDER — VANCOMYCIN HCL 750 MG/150ML IV SOLN: 750.0000 mg | INTRAVENOUS | Status: DC

## 2022-09-02 MED ORDER — ATROPINE SULFATE 1 MG/10ML IJ SOSY
PREFILLED_SYRINGE | INTRAMUSCULAR | Status: AC
  Administered 2022-09-02: 0.5 mg
  Filled 2022-09-02: qty 10

## 2022-09-02 MED ORDER — ENOXAPARIN SODIUM 40 MG/0.4ML IJ SOSY
40.0000 mg | PREFILLED_SYRINGE | INTRAMUSCULAR | Status: DC
  Administered 2022-09-03 – 2022-09-05 (×3): 40 mg via SUBCUTANEOUS
  Filled 2022-09-02 (×3): qty 0.4

## 2022-09-02 MED ORDER — CHLORHEXIDINE GLUCONATE CLOTH 2 % EX PADS
6.0000 | MEDICATED_PAD | Freq: Every day | CUTANEOUS | Status: DC
  Administered 2022-09-02 – 2022-09-03 (×5): 6 via TOPICAL

## 2022-09-02 MED ORDER — MIDAZOLAM-SODIUM CHLORIDE 100-0.9 MG/100ML-% IV SOLN
0.5000 mg/h | INTRAVENOUS | Status: DC
  Administered 2022-09-02: 1 mg/h via INTRAVENOUS
  Filled 2022-09-02: qty 100

## 2022-09-02 MED ORDER — POTASSIUM PHOSPHATES 15 MMOLE/5ML IV SOLN
10.0000 mmol | Freq: Once | INTRAVENOUS | Status: AC
  Administered 2022-09-02: 10 mmol via INTRAVENOUS
  Filled 2022-09-02: qty 3.33

## 2022-09-02 MED ORDER — POTASSIUM CHLORIDE 10 MEQ/100ML IV SOLN
10.0000 meq | INTRAVENOUS | Status: AC
  Administered 2022-09-02 (×3): 10 meq via INTRAVENOUS
  Filled 2022-09-02 (×3): qty 100

## 2022-09-02 MED ORDER — SODIUM CHLORIDE 0.9 % IV SOLN
2.0000 g | INTRAVENOUS | Status: DC
  Administered 2022-09-02: 2 g via INTRAVENOUS
  Filled 2022-09-02: qty 12.5

## 2022-09-02 MED ORDER — ACETAMINOPHEN 325 MG PO TABS
650.0000 mg | ORAL_TABLET | Freq: Four times a day (QID) | ORAL | Status: DC | PRN
  Administered 2022-09-02 – 2022-09-03 (×2): 650 mg
  Filled 2022-09-02 (×2): qty 2

## 2022-09-02 MED ORDER — ORAL CARE MOUTH RINSE
15.0000 mL | OROMUCOSAL | Status: DC
  Administered 2022-09-02 – 2022-09-03 (×18): 15 mL via OROMUCOSAL

## 2022-09-02 MED ORDER — FENTANYL BOLUS VIA INFUSION: 50.0000 ug | INTRAVENOUS | Status: DC | PRN

## 2022-09-02 MED ORDER — VANCOMYCIN HCL 1500 MG/300ML IV SOLN
1500.0000 mg | Freq: Once | INTRAVENOUS | Status: AC
  Administered 2022-09-02: 1500 mg via INTRAVENOUS
  Filled 2022-09-02: qty 300

## 2022-09-02 NOTE — Progress Notes (Addendum)
Stat  EEG complete - results pending.  

## 2022-09-02 NOTE — Progress Notes (Signed)
Right lower extremity venous duplex has been completed. Preliminary results can be found in CV Proc through chart review.  Results were given to the patient's nurse, Cori.  09/02/22 10:43 AM Carlos Levering RVT

## 2022-09-02 NOTE — Progress Notes (Signed)
RT transported patient to 30M from ED without any complications.

## 2022-09-02 NOTE — Progress Notes (Signed)
RT repositioned tube to 23 cm at the lip per MD order during bronchoscopy.

## 2022-09-02 NOTE — Procedures (Signed)
History: 81 yo M with acute encephalopathy  Sedation: propofol  Technique: This EEG was acquired with electrodes placed according to the International 10-20 electrode system (including Fp1, Fp2, F3, F4, C3, C4, P3, P4, O1, O2, T3, T4, T5, T6, A1, A2, Fz, Cz, Pz). The following electrodes were missing or displaced: none.   Background: The background consists of low voltage irregular delta activity with occasional sleep spindles. This pattern was present throughout the recording. No waking state or epileptiform discharge was seen.   Photic stimulation: Physiologic driving is none  EEG Abnormalities: sedated EEG  Clinical Interpretation: This EEG is consistent with the patient's sedated state. There was no seizure or seizure predisposition recorded on this study. Please note that lack of epileptiform activity on EEG does not preclude the possibility of epilepsy.   Roland Rack, MD [facility] [phone]  If 7pm- 7am, please page neurology on call as listed in [place].

## 2022-09-02 NOTE — Progress Notes (Signed)
eLink Physician-Brief Progress Note Patient Name: [name] Gibson DOB: 07-Dec-1941 MRN: [id number]   Date of Service  09/02/2022  HPI/Events of Note  81 y.o. male with recent recurrent falls, no trauma or LOC on any of the falls.   Also had cough and cold for 2 weeks and was exposed to COVID at his church. Had  +  chills, diaphoresis, and diarrhea   Found to have AKI with rhabdo, transaminitis and possible R liver mass.   He ate dinner and was found in ED stretcher minimally responsive, hypoxic to 60s and 70s and tachypneic to 30s and 40s. Prior to this, he was on room air and was alert and oriented and able to communicate appropriately. He was also noted to have fever to 103. There was concern for some tremor like activity -> he was intubated.   Prior to intubation, he was noted to have bilateral upper extremity jerking along with horizontal nystagmus and horizontal side to side head shaking.   Neurology was consulted and saw pt in ED.   E-link called due to vent dyssynchrony. CXR done, c/w new left lung whiteout, new from ED.   eICU Interventions  - requesting ground team evaluation for possible bronchoscopy  - will attempt suction with mucomyst, 3% saline  - good lung down position  - PEEP to increase to 8cm  - above probably 2/2 aspiration event in ED.  -EEG ongoing now, neurology following   -Possible meningitis - new onset seizures with 103 F. - Empiric Vanc/Ceftriaxone/Bactrim (PCN allergy)/Acyclovir. - Neuro to perform LP per HPI report         Jaysie Benthall N Jalicia Roszak 09/02/2022, 2:17 AM

## 2022-09-02 NOTE — Procedures (Signed)
Central Venous Catheter Insertion Procedure Note  Grayling Schranz  [id number]  [birth year]  Date:09/02/22  Time:4:38 AM   Provider Performing:Faustine Tates Ruthann Cancer   Procedure: Insertion of Non-tunneled Central Venous Catheter(36556) with US guidance (82060)   Indication(s) Medication administration  Consent Unable to obtain consent due to emergent nature of procedure.  Anesthesia Topical only with 1% lidocaine   Timeout Verified patient identification, verified procedure, site/side was marked, verified correct patient position, special equipment/implants available, medications/allergies/relevant history reviewed, required imaging and test results available.  Sterile Technique Maximal sterile technique including full sterile barrier drape, hand hygiene, sterile gown, sterile gloves, mask, hair covering, sterile ultrasound probe cover (if used).  Procedure Description Area of catheter insertion was cleaned with chlorhexidine and draped in sterile fashion.  With real-time ultrasound guidance a central venous catheter was placed into the left internal jugular vein. Nonpulsatile blood flow and easy flushing noted in all ports.  The catheter was sutured in place and sterile dressing applied.  Complications/Tolerance None; patient tolerated the procedure well. Chest X-ray is ordered to verify placement for internal jugular or subclavian cannulation.   Chest x-ray is not ordered for femoral cannulation.  EBL Minimal  Specimen(s) None

## 2022-09-02 NOTE — Progress Notes (Signed)
PHARMACY - PHYSICIAN COMMUNICATION CRITICAL VALUE ALERT - BLOOD CULTURE IDENTIFICATION (BCID)  [name] Gibson is an 81 y.o. male who presented to [facility] on 09/01/2022 with a chief complaint of R knee pain   Assessment:  1/4 Bcx growing staph species  Name of physician (or Provider) Contacted: Dr Tacy Learn  Current antibiotics: cefepime 2g IV q12h   Changes to prescribed antibiotics recommended:  None - likely contaminant   Results for orders placed or performed during the hospital encounter of 09/01/22  Blood Culture ID Panel (Reflexed) (Collected: 09/01/2022  9:45 PM)  Result Value Ref Range   Enterococcus faecalis NOT DETECTED NOT DETECTED   Enterococcus Faecium NOT DETECTED NOT DETECTED   Listeria monocytogenes NOT DETECTED NOT DETECTED   Staphylococcus species DETECTED (A) NOT DETECTED   Staphylococcus aureus (BCID) NOT DETECTED NOT DETECTED   Staphylococcus epidermidis NOT DETECTED NOT DETECTED   Staphylococcus lugdunensis NOT DETECTED NOT DETECTED   Streptococcus species NOT DETECTED NOT DETECTED   Streptococcus agalactiae NOT DETECTED NOT DETECTED   Streptococcus pneumoniae NOT DETECTED NOT DETECTED   Streptococcus pyogenes NOT DETECTED NOT DETECTED   A.calcoaceticus-baumannii NOT DETECTED NOT DETECTED   Bacteroides fragilis NOT DETECTED NOT DETECTED   Enterobacterales NOT DETECTED NOT DETECTED   Enterobacter cloacae complex NOT DETECTED NOT DETECTED   Escherichia coli NOT DETECTED NOT DETECTED   Klebsiella aerogenes NOT DETECTED NOT DETECTED   Klebsiella oxytoca NOT DETECTED NOT DETECTED   Klebsiella pneumoniae NOT DETECTED NOT DETECTED   Proteus species NOT DETECTED NOT DETECTED   Salmonella species NOT DETECTED NOT DETECTED   Serratia marcescens NOT DETECTED NOT DETECTED   Haemophilus influenzae NOT DETECTED NOT DETECTED   Neisseria meningitidis NOT DETECTED NOT DETECTED   Pseudomonas aeruginosa NOT DETECTED NOT DETECTED   Stenotrophomonas maltophilia NOT DETECTED  NOT DETECTED   Candida albicans NOT DETECTED NOT DETECTED   Candida auris NOT DETECTED NOT DETECTED   Candida glabrata NOT DETECTED NOT DETECTED   Candida krusei NOT DETECTED NOT DETECTED   Candida parapsilosis NOT DETECTED NOT DETECTED   Candida tropicalis NOT DETECTED NOT DETECTED   Cryptococcus neoformans/gattii NOT DETECTED NOT DETECTED    Wilson Singer, PharmD Clinical Pharmacist 09/02/2022 6:24 PM

## 2022-09-02 NOTE — Progress Notes (Signed)
Pharmacy Antibiotic Note  [name] Gibson is a 81 y.o. male admitted on 09/01/2022 with sepsis.  Pharmacy has been consulted for Vancomycin/Cefepime dosing. WBC mildly elevated. Noted renal dysfunction.   Pt also had some altered mental status, concern for seizure, concern for meningitis. LP was done with with only abnormality in fluid analysis being elevated glucose. No organisms or WBC seen on gram stain. Discussed with Dr. Leonel Ramsay (neuro) and pt doesn't need meningitis coverage at this time. Discussed with EL[facility]/CCM, will start broad spectrum sepsis coverage with Vancomycin and Cefepime.   Plan: Vancomycin 1500 mg IV x 1, then 750 mg IV q48h >>>Estimated AUC: 453 Cefepime 2g IV q24h Trend WBC, temp, renal function  F/U infectious work-up Drug levels as indicated   Height: '5\' 4"'$  (162.6 cm) Weight: 83 kg (183 lb) IBW/kg (Calculated) : 59.2  Temp (24hrs), Avg:101.3 F (38.5 C), Min:97.4 F (36.3 C), Max:103.6 F (39.8 C)  Recent Labs  Lab 09/01/22 1020  WBC 12.4*  CREATININE 2.50*    Estimated Creatinine Clearance: 21.8 mL/min (A) (by C-G formula based on SCr of 2.5 mg/dL (H)).    Allergies  Allergen Reactions   Penicillins     REACTION: unspecified Has patient had a PCN reaction causing immediate rash, facial/tongue/throat swelling, SOB or lightheadedness with hypotension: N Has patient had a PCN reaction causing severe rash involving mucus membranes or skin necrosis: N Has patient had a PCN reaction that required hospitalization: N Has patient had a PCN reaction occurring within the last 10 years: N If all of the above answers are "NO", then may proceed with Cephalosporin use.     Narda Bonds, PharmD, BCPS Clinical Pharmacist Phone: [phone]

## 2022-09-02 NOTE — Progress Notes (Signed)
Cross covering ICU physician.   Placed cvc at bedside and per imaging for placement was crossing midline and turned up to R IJ. Line was withdrawn in sterile fashion and dressing replaced in sterile fashion.   Cxr pending.  Elink to follow

## 2022-09-02 NOTE — Progress Notes (Signed)
Initial Nutrition Assessment  DOCUMENTATION CODES:   Non-severe (moderate) malnutrition in context of chronic illness  INTERVENTION:   Initiate tube feeds via Cortrak: - Start Vital AF 1.2 @ 30 ml/hr and advance by 10 ml q 8 hours to goal rate of 60 ml/hr (1440 ml/day)  Tube feeding regimen at goal rate provides 1728 kcal, 108 grams of protein, and 1168 ml of H2O.   NUTRITION DIAGNOSIS:   Moderate Malnutrition related to chronic illness (prostate cancer) as evidenced by moderate fat depletion, moderate muscle depletion.  GOAL:   Patient will meet greater than or equal to 90% of their needs  MONITOR:   Vent status, Labs, Weight trends, TF tolerance, I & O's  REASON FOR ASSESSMENT:   Ventilator, Consult Enteral/tube feeding initiation and management  ASSESSMENT:   81 year old male who presented to the ED on 9/25 with R knee pain, weakness. PMH of HTN, HLD, hypothyroidism, prostate cancer. Pt admitted with AKI on CKD stage IIIa.  09/25 - increased WOB requiring intubation, seizure-like activity, s/p lumbar puncture 09/26 - s/p bronchoscopy, Cortrak placed (tip gastric)  Discussed pt with RN and during ICU rounds. Consult received for enteral nutrition initiation and management. Cortrak placed this morning, tip gastric. Pt with respiratory failure and aspiration pneumonia. Meningitis was ruled out. Neurology following.  Unable to obtain diet and weight history at this time. Reviewed weight history in chart. Pt with a 5.6 kg weight loss since 03/03/22. This is a 6.8% weight loss in 6 months which is not clinically significant for timeframe but is concerning given subcutaneous fat and muscle wasting. Pt meets criteria for malnutrition.  RD to start tube feeds at low rate and slowly advance to goal. RN aware.  Patient is currently intubated on ventilator support MV: 7.0 L/min Temp (24hrs), Avg:99.5 F (37.5 C), Min:97.7 F (36.5 C), Max:103.6 F (39.8  C)  Drips: Fentanyl Versed LR: 100 ml/hr  Medications reviewed and include: colace, IV protonix, miralax, IV abx, IV KCl 10 mEq x 6 runs, IV potassium phosphate 10 mmol once  Labs reviewed: potassium 2.7, BUN 50, creatinine 1.33, phosphorus 2.4, magnesium 2.8, elevated LFTs, WBC 11.3, platelets 93 CBG's: 127-185 x 24 hours  UOP: 500 ml x 12 hours I/O's: +605 ml since admit  NUTRITION - FOCUSED PHYSICAL EXAM:  Flowsheet Row Most Recent Value  Orbital Region Moderate depletion  Upper Arm Region Mild depletion  Thoracic and Lumbar Region Moderate depletion  Buccal Region Unable to assess  Temple Region Mild depletion  Clavicle Bone Region Moderate depletion  Clavicle and Acromion Bone Region Moderate depletion  Scapular Bone Region Unable to assess  Dorsal Hand Mild depletion  Patellar Region Moderate depletion  Anterior Thigh Region Moderate depletion  Posterior Calf Region Moderate depletion  Edema (RD Assessment) None  Hair Reviewed  Eyes Reviewed  Mouth Reviewed  Skin Reviewed  Nails Reviewed       Diet Order:   Diet Order             Diet NPO time specified  Diet effective now                   EDUCATION NEEDS:   No education needs have been identified at this time  Skin:  Skin Assessment: Reviewed RN Assessment  Last BM:  no documented BM  Height:   Ht Readings from Last 1 Encounters:  09/02/22 '5\' 4"'$  (1.626 m)    Weight:   Wt Readings from Last 1 Encounters:  09/02/22 77.2  kg    BMI:  Body mass index is 29.21 kg/m.  Estimated Nutritional Needs:   Kcal:  1700-1900  Protein:  95-110 grams  Fluid:  1.7-1.9 L    Gustavus Bryant, MS, RD, LDN Inpatient Clinical Dietitian Please see AMiON for contact information.

## 2022-09-02 NOTE — Procedures (Signed)
Cortrak  Person Inserting Tube:  Maylon Peppers C, RD Tube Type:  Cortrak - 43 inches Tube Size:  10 Tube Location:  Left nare Secured by: Bridle Technique Used to Measure Tube Placement:  Marking at nare/corner of mouth Cortrak Secured At:  71 cm   Cortrak Tube Team Note:  Consult received to place a Cortrak feeding tube.   X-ray is required, abdominal x-ray has been ordered by the Cortrak team. Please confirm tube placement before using the Cortrak tube.   If the tube becomes dislodged please keep the tube and contact the Cortrak team at [email or web address] ([email or web address]) for replacement.  If after hours and replacement cannot be delayed, place a NG tube and confirm placement with an abdominal x-ray.    Lockie Pares., RD, LDN, CNSC See AMiON for contact information

## 2022-09-02 NOTE — Progress Notes (Signed)
PT Cancellation Note  Patient Details Name: [name] Gibson MRN: [id number] DOB: 1941/03/31   Cancelled Treatment:    Reason Eval/Treat Not Completed: Patient not medically ready (intubated with FiO2 100%)   [name] Gibson B [name] Gibson 09/02/2022, 6:45 AM [name] Gibson, PT Acute Rehabilitation Services Office: [phone]

## 2022-09-02 NOTE — Procedures (Signed)
Bedside Bronchoscopy Procedure Note [name] Gibson [id number] 24-Dec-1940  Procedure: Bronchoscopy Indications: Diagnostic evaluation of the airways  Procedure Details: ET Tube Size: 7.5 ET Tube secured at lip (cm): 23  Bite block in place: No In preparation for procedure, Patient hyper-oxygenated with 100 % FiO2 Airway entered and the following bronchi were examined: LUL, LLL  Bronchoscope removed, pt placed back on FIO2 100%.    Evaluation BP 112/72   Pulse 75   Temp 99 F (37.2 C)   Resp (!) 23   Ht '5\' 4"'$  (1.626 m)   Wt 83 kg   SpO2 100%   BMI 31.41 kg/m  Breath Sounds:Diminished O2 sats: stable throughout Patient's Current Condition: stable Specimens:  None Complications: No apparent complications Patient did tolerate procedure well.   Warrick Parisian 09/02/2022, 3:54 AM

## 2022-09-02 NOTE — Progress Notes (Signed)
PHARMACY NOTE:  ANTIMICROBIAL RENAL DOSAGE ADJUSTMENT  Current antimicrobial regimen includes a mismatch between antimicrobial dosage and estimated renal function.  As per policy approved by the Pharmacy & Therapeutics and Medical Executive Committees, the antimicrobial dosage will be adjusted accordingly.  Current antimicrobial dosage:  Cefepime 2G q24h   Indication: sepsis   Renal Function:  Estimated Creatinine Clearance: 39.5 mL/min (A) (by C-G formula based on SCr of 1.33 mg/dL (H)).    Antimicrobial dosage has been changed to:  Cefepime 2G q12h per protocol.   Additional comments: IV Vancomycin has been D/C after discussion with CCM.   Thank you for allowing pharmacy to be a part of this patient's care.  Ventura Sellers, [facility] 09/02/2022 10:44 AM

## 2022-09-02 NOTE — Procedures (Signed)
Bronchoscopy Procedure Note  Taijuan Dietrich Pates  [id number]  10-Aug-1941  Date:09/02/22  Time:4:42 AM   Provider Performing:Dailen Mcclish Ruthann Cancer   Procedure(s):  Flexible Bronchoscopy [phone]  Indication(s) Opacification of L lung  Consent Unable to obtain consent due to emergent nature of procedure.  Anesthesia See mar   Time Out Verified patient identification, verified procedure, site/side was marked, verified correct patient position, special equipment/implants available, medications/allergies/relevant history reviewed, required imaging and test results available.   Sterile Technique Usual hand hygiene, masks, gowns, and gloves were used   Procedure Description and findings.  Bronchoscope advanced through endotracheal tube and into airway.  Airways were examined down to subsegmental level with findings noted below.   Following diagnostic evaluation, no obstruction was seen bilaterally. Airways appears clear without secretions bilaterally. No bal was thus obtained and scope was withdrawn without difficulty.     Complications/Tolerance None; patient tolerated the procedure well. Chest X-ray is needed post procedure.   EBL Minimal   Specimen(s) none

## 2022-09-02 NOTE — Progress Notes (Signed)
NAME:  Marcella Dunnaway, MRN:  [id number], DOB:  08/03/1941, LOS: 1 ADMISSION DATE:  09/01/2022, CONSULTATION DATE:  09/01/22 REFERRING MD:  McDiarmid CHIEF COMPLAINT:  AMS   History of Present Illness:  Fredick Dietrich Pates is a 81 y.o. male who has a PMH as below. He presented to [facility] ED 9/25 with R knee pain after he sustained a mechanical fall a few weeks prior. Pain did not resolve so he came to ED for further evaluation. Since that initial fall, he has had an additional 3-4 falls due to weakness. No trauma or LOC on any of the falls.  Had apparently also had cough and cold for 2 weeks and was exposed to COVID at his church. Denied any fevers, myalgias but did report some chills, diaphoresis, and diarrhea x 2 over the 2 days prior to ED presentation.  He was found to have AKI with rhabdo, transaminitis and possible R liver mass. He was initially admitted by FMTS for further evaluation and management.  After he was admitted, he ate dinner and was later found in ED stretcher minimally responsive, hypoxic to 60s and 70s and tachypneic to 30s and 40s. Prior to this, he was on room air and was alert and oriented and able to communicate appropriately. He was also noted to have fever to 103. There was concern for some tremor like activity prior to FMTS being contacted.    PCCM called to see in consultation given acute change. During my evaluation, it was decided that best course of action would be to proceed with intubation which EDP graciously assisted with. Prior to intubation, he was noted to have bilateral upper extremity jerking along with horizontal nystagmus and horizontal side to side head shaking. This resolved after a few seconds.  Neurology was consulted and saw pt in ED. He has CT head pending and will likely get EEG as well as consideration for lumbar puncture.   Pertinent  Medical History:  has COLONIC POLYPS; HYPERTHYROIDISM; Hyperlipidemia; GLAUCOMA; Primary hypertension; SINUSITIS- ACUTE-NOS;  URI; DIVERTICULOSIS OF COLON; PSA, INCREASED; History of prostate cancer; GLUCOSE INTOLERANCE, HX OF; Obesity (BMI 30-39.9); Dizziness; Preventative health care; Traumatic hematoma of head; Pain in upper jaw; Thyroid function study abnormality; Insomnia; AKI (acute kidney injury) ([place]); Atherosclerosis of aorta (HCC); PAD (peripheral artery disease) ([place]); Generalized weakness; Impaired ambulation; Pain in right knee; Hypokalemia; Elevated creatine kinase; Transaminitis; Hypotension; Irregular cardiac rhythm; Dyspnea; Liver mass, right lobe;  Bilateral perinephric fat stranding CT; Hypermagnesemia; Fall; Encephalopathy acute; Acute respiratory failure with hypoxia ([place]); and Atelectasis of left lung on their problem list.  Significant Hospital Events: Including procedures, antibiotic start and stop dates in addition to other pertinent events   9/25 admitted. Acute change in mental status along with hypoxia and resp failure requiring intubation. Unclear whether had aspiration event followed by seizures and post ictal state, versus seizures as initial inticing event. 9/26 LP and EEG completed overnight both negative. Remains on vent this am with high FIO2 needs of 80%  Interim History / Subjective:  Sedated on vent   Objective:  Blood pressure 100/67, pulse 64, temperature 97.7 F (36.5 C), resp. rate 11, height '5\' 4"'$  (1.626 m), weight 77.2 kg, SpO2 99 %.    Vent Mode: PRVC FiO2 (%):  [80 %-100 %] 80 % Set Rate:  [15 bmp] 15 bmp Vt Set:  [470 mL] 470 mL PEEP:  [5 cmH20-8 cmH20] 8 cmH20 Plateau Pressure:  [17 cmH20-18 cmH20] 17 cmH20   Intake/Output Summary (Last 24  hours) at 09/02/2022 5329 Last data filed at 09/02/2022 9242 Gross per 24 hour  Intake 1780.02 ml  Output 1175 ml  Net 605.02 ml    Filed Weights   09/01/22 1054 09/02/22 0515  Weight: 83 kg 77.2 kg    Examination: General: Acute on chronically ill appearing elderly male lying in bed on mechanical ventilation, in NAD HEENT:  ETT, MM pink/moist, PERRL,  Neuro: Sedated on vent, unable to follow commands  CV: s1s2 regular rate and rhythm, no murmur, rubs, or gallops,  PULM:  Clear to ascultation, faint rhonchi to bases, tolerating vent, no increased work of breathing  GI: soft, bowel sounds active in all 4 quadrants, non-tender, non-distended Extremities: warm/dry, no edema  Skin: no rashes or lesions  Resolved problems     Assessment & Plan:   Acute hypoxic respiratory failure  -Presumed 2/2 aspiration event in ED. Now s/p intubation in ED. Moderate left pleural effusion -Seen on CXR 9/26 P: Continue ventilator support with lung protective strategies  Wean PEEP and FiO2 for sats greater than 90%. Head of bed elevated 30 degrees. Plateau pressures less than 30 cm H20.  Follow intermittent chest x-ray and ABG.   SAT/SBT as tolerated, mentation preclude extubation  Ensure adequate pulmonary hygiene  Follow cultures  VAP bundle in place  PAD protocol Continue empiric Ceftriaxone  Obtain ECHO   Possible new onset seizures -Prior to intubation, noted to have bilateral upper extremity jerking along with horizontal nystagmus and horizontal side to side head shaking. This resolved after a few seconds -LP preformed overnight 9/25, glucose 107 -Head CT negative  Possible meningitis  P: Neurology following, appreciate assistance   Maintain neuro protective measures; goal for eurothermia, euglycemia, eunatermia, normoxia, and PCO2 goal of 35-40 Nutrition and bowel regiment  Seizure precautions  EEG negative for seizure activity  Aspirations precautions  Continue empiric Cefepime and Vancomycin   AKI - improved  Hypokalemia. Rhabdomyolysis. Hypocalcemia. Acute Kidney Injury  -in the setting of critical illness, creatinine on admission 2.50 with GFR 25, creatinine in March 2023 1.19 with GFR estimated 56 P: Follow renal function  Monitor urine output Trend Bmet Avoid nephrotoxins Ensure adequate  renal perfusion  IV hydration Trend CK  Transaminitis  -Unclear etiology, possibly hepatic congestion vs intrahepatic process given incidental finding of R liver mass on imaging. Hepatitis panel negative. R liver mass  -Incidental finding on CT chest/abd/pelvis. P: Avoid hepatotoxins Trend LFTs We will need outpatient follow-up of liver mass  Hx HTN P: Continue to hold home antihypertensives  Multiple recent falls. P: PT/OT evals when able  Best practice (evaluated daily):  Diet/type: NPO DVT prophylaxis: LMWH GI prophylaxis: PPI Lines: N/A Foley:  N/A Code Status:  full code Last date of multidisciplinary goals of care discussion: None yet.   Critical care time: 38 min.  Ajai Terhaar D. Kenton Kingfisher, NP-C [facility] Personal contact information can be found on Amion  09/02/2022, 9:55 AM

## 2022-09-03 ENCOUNTER — Inpatient Hospital Stay (HOSPITAL_COMMUNITY): Payer: Medicare Other

## 2022-09-03 DIAGNOSIS — J9600 Acute respiratory failure, unspecified whether with hypoxia or hypercapnia: Secondary | ICD-10-CM

## 2022-09-03 DIAGNOSIS — N179 Acute kidney failure, unspecified: Secondary | ICD-10-CM | POA: Diagnosis not present

## 2022-09-03 LAB — CBC
HCT: 32 % — ABNORMAL LOW (ref 39.0–52.0)
Hemoglobin: 11.3 g/dL — ABNORMAL LOW (ref 13.0–17.0)
MCH: 32.8 pg (ref 26.0–34.0)
MCHC: 35.3 g/dL (ref 30.0–36.0)
MCV: 93 fL (ref 80.0–100.0)
Platelets: 103 10*3/uL — ABNORMAL LOW (ref 150–400)
RBC: 3.44 MIL/uL — ABNORMAL LOW (ref 4.22–5.81)
RDW: 15.1 % (ref 11.5–15.5)
WBC: 16.5 10*3/uL — ABNORMAL HIGH (ref 4.0–10.5)
nRBC: 0 % (ref 0.0–0.2)

## 2022-09-03 LAB — GLUCOSE, CAPILLARY
Glucose-Capillary: 115 mg/dL — ABNORMAL HIGH (ref 70–99)
Glucose-Capillary: 141 mg/dL — ABNORMAL HIGH (ref 70–99)
Glucose-Capillary: 142 mg/dL — ABNORMAL HIGH (ref 70–99)
Glucose-Capillary: 148 mg/dL — ABNORMAL HIGH (ref 70–99)
Glucose-Capillary: 151 mg/dL — ABNORMAL HIGH (ref 70–99)
Glucose-Capillary: 159 mg/dL — ABNORMAL HIGH (ref 70–99)

## 2022-09-03 LAB — COMPREHENSIVE METABOLIC PANEL
ALT: 110 U/L — ABNORMAL HIGH (ref 0–44)
AST: 135 U/L — ABNORMAL HIGH (ref 15–41)
Albumin: 2.1 g/dL — ABNORMAL LOW (ref 3.5–5.0)
Alkaline Phosphatase: 103 U/L (ref 38–126)
Anion gap: 8 (ref 5–15)
BUN: 39 mg/dL — ABNORMAL HIGH (ref 8–23)
CO2: 27 mmol/L (ref 22–32)
Calcium: 8.2 mg/dL — ABNORMAL LOW (ref 8.9–10.3)
Chloride: 104 mmol/L (ref 98–111)
Creatinine, Ser: 1.09 mg/dL (ref 0.61–1.24)
GFR, Estimated: >60 mL/min (ref >60)
Glucose, Bld: 179 mg/dL — ABNORMAL HIGH (ref 70–99)
Potassium: 3.3 mmol/L — ABNORMAL LOW (ref 3.5–5.1)
Sodium: 139 mmol/L (ref 135–145)
Total Bilirubin: 0.9 mg/dL (ref 0.3–1.2)
Total Protein: 5.7 g/dL — ABNORMAL LOW (ref 6.5–8.1)

## 2022-09-03 LAB — CALCIUM, IONIZED: Calcium, Ionized, Serum: 4.8 mg/dL (ref 4.5–5.6)

## 2022-09-03 LAB — MAGNESIUM: Magnesium: 2.6 mg/dL — ABNORMAL HIGH (ref 1.7–2.4)

## 2022-09-03 LAB — PHOSPHORUS: Phosphorus: 2.8 mg/dL (ref 2.5–4.6)

## 2022-09-03 LAB — CK: Total CK: 1841 U/L — ABNORMAL HIGH (ref 49–397)

## 2022-09-03 MED ORDER — POLYETHYLENE GLYCOL 3350 17 G PO PACK: 17.0000 g | PACK | Freq: Every day | ORAL | Status: DC

## 2022-09-03 MED ORDER — PANTOPRAZOLE SODIUM 40 MG PO TBEC
40.0000 mg | DELAYED_RELEASE_TABLET | Freq: Every day | ORAL | Status: DC
  Administered 2022-09-04: 40 mg via ORAL
  Filled 2022-09-03: qty 1

## 2022-09-03 MED ORDER — POTASSIUM CHLORIDE 20 MEQ PO PACK
40.0000 meq | PACK | Freq: Once | ORAL | Status: AC
  Administered 2022-09-03: 40 meq
  Filled 2022-09-03: qty 2

## 2022-09-03 MED ORDER — METOPROLOL TARTRATE 25 MG PO TABS
25.0000 mg | ORAL_TABLET | Freq: Two times a day (BID) | ORAL | Status: DC
  Administered 2022-09-03 – 2022-09-07 (×8): 25 mg via ORAL
  Filled 2022-09-03 (×8): qty 1

## 2022-09-03 MED ORDER — LATANOPROST 0.005 % OP SOLN
1.0000 [drp] | Freq: Every day | OPHTHALMIC | Status: DC
  Administered 2022-09-03 – 2022-09-06 (×4): 1 [drp] via OPHTHALMIC
  Filled 2022-09-03 (×2): qty 2.5

## 2022-09-03 MED ORDER — DOCUSATE SODIUM 100 MG PO CAPS
100.0000 mg | ORAL_CAPSULE | Freq: Two times a day (BID) | ORAL | Status: DC
  Filled 2022-09-03 (×2): qty 1

## 2022-09-03 MED ORDER — METOPROLOL TARTRATE 25 MG PO TABS: 25.0000 mg | ORAL_TABLET | Freq: Two times a day (BID) | ORAL | Status: DC

## 2022-09-03 MED ORDER — ORAL CARE MOUTH RINSE: 15.0000 mL | OROMUCOSAL | Status: DC | PRN

## 2022-09-03 MED ORDER — AMLODIPINE BESYLATE 10 MG PO TABS
10.0000 mg | ORAL_TABLET | Freq: Every day | ORAL | Status: DC
  Administered 2022-09-04 – 2022-09-07 (×4): 10 mg via ORAL
  Filled 2022-09-03 (×4): qty 1

## 2022-09-03 MED ORDER — AMLODIPINE BESYLATE 10 MG PO TABS
10.0000 mg | ORAL_TABLET | Freq: Every day | ORAL | Status: DC
  Administered 2022-09-03: 10 mg
  Filled 2022-09-03: qty 1

## 2022-09-03 MED ORDER — PANTOPRAZOLE 2 MG/ML SUSPENSION: 40.0000 mg | Freq: Every day | ORAL | Status: DC

## 2022-09-03 MED ORDER — ACETAMINOPHEN 325 MG PO TABS
650.0000 mg | ORAL_TABLET | Freq: Four times a day (QID) | ORAL | Status: DC | PRN
  Administered 2022-09-05: 650 mg via ORAL
  Filled 2022-09-03: qty 2

## 2022-09-03 NOTE — Progress Notes (Signed)
NEUROLOGY CONSULTATION PROGRESS NOTE   Date of service: September 03, 2022 Patient Name: [name] Gibson MRN:  [id number] DOB:  [birth year]  Brief HPI  [name] Gibson is a 81 y.o. male with hx of HTN, HLD admite with knee pain and AKI. He had acute mental status decline in the setting of hypoxemia.Suspicion is that he likely aspirated resulting in hypoxemia leading to mental status decline.  Though seizure was mentioned as a possibility, this is not from definite, and would not start antiepileptics unless further evidence of this was found.   Interval Hx   rEEG negative. LP not concerning for meningitis. Intubated but awakens to voice, makes eye contact and follows commands in all extremities.  Vitals   Vitals:   09/03/22 0900 09/03/22 1000 09/03/22 1100 09/03/22 1114  BP: 123/86 129/69 132/71 132/71  Pulse: 80 86 81 76  Resp: (!) 23 13 12 14   Temp: (!) 97.5 F (36.4 C) 98.6 F (37 C) 98.8 F (37.1 C) 98.8 F (37.1 C)  TempSrc:      SpO2: 98% 99% 97% 98%  Weight:      Height:         Body mass index is 29.9 kg/m.  Physical Exam   General: Laying comfortably in bed; in no acute distress despite intubation. HENT: Normal oropharynx and mucosa. Normal external appearance of ears and nose.  Neck: Supple, no pain or tenderness  CV: No JVD. No peripheral edema.  Pulmonary: Symmetric Chest rise. Normal respiratory effort.  Abdomen: Soft to touch, non-tender.  Ext: No cyanosis, edema, or deformity  Skin: No rash. Normal palpation of skin.   Musculoskeletal: Normal digits and nails by inspection. No clubbing.   Neurologic Examination  Mental status/Cognition: Alert, makes eye contact. Does mouth words. Unable to assess orientation given intubation. Speech/language: comprehension intact to simple commands, mouth words Cranial nerves:   CN II Pupils equal and reactive to light, no VF deficits    CN III,IV,VI EOM intact, no gaze preference or deviation, no nystagmus    CN V normal  sensation in V1, V2, and V3 segments bilaterally    CN VII no asymmetry, no nasolabial fold flattening    CN VIII normal hearing to speech    CN IX & X normal palatal elevation, no uvular deviation    CN XI 5/5 head turn and 5/5 shoulder shrug bilaterally    CN XII midline tongue protrusion    Motor:  Muscle bulk: normal, tone normal  RUE: grossly 4/5 LUE: grossly 4/5 RLE: 4/5 LLE: 4/5  Sensation:  Light touch Intact throughout   Pin prick    Temperature    Vibration   Proprioception    Coordination/Complex Motor:  - Finger to Nose intact BL  Labs   Basic Metabolic Panel:  Lab Results  Component Value Date   NA 139 09/03/2022   K 3.3 (L) 09/03/2022   CO2 27 09/03/2022   GLUCOSE 179 (H) 09/03/2022   BUN 39 (H) 09/03/2022   CREATININE 1.09 09/03/2022   CALCIUM 8.2 (L) 09/03/2022   GFRNONAA >60 09/03/2022   GFRAA  12/12/2009    >60        The eGFR has been calculated using the MDRD equation. This calculation has not been validated in all clinical situations. eGFR's persistently <60 mL/min signify possible Chronic Kidney Disease.   HbA1c:  Lab Results  Component Value Date   HGBA1C 6.1 (H) 09/02/2022   LDL:  Lab Results  Component Value Date  [place] 84 03/03/2022   Urine Drug Screen:     Component Value Date/Time   LABOPIA NONE DETECTED 09/02/2022 0021   COCAINSCRNUR NONE DETECTED 09/02/2022 0021   LABBENZ NONE DETECTED 09/02/2022 0021   AMPHETMU NONE DETECTED 09/02/2022 0021   THCU NONE DETECTED 09/02/2022 0021   LABBARB NONE DETECTED 09/02/2022 0021    Alcohol Level No results found for: "ETH" No results found for: "PHENYTOIN", "ZONISAMIDE", "LAMOTRIGINE", "LEVETIRACETA" No results found for: "PHENYTOIN", "PHENOBARB", "VALPROATE", "CBMZ"  Imaging and Diagnostic studies   CTH w/o contrast: No acute intracranial hemorrhage or infarct. Mild senescent change.  rEEG: This EEG is consistent with the patient's sedated state. There was no seizure  or seizure predisposition recorded on this study. Please note that lack of epileptiform activity on EEG does not preclude the possibility of epilepsy.   Impression   [name] Gibson is a 81 y.o. male with hx of HTN, HLD admite with knee pain and AKI. He had acute mental status decline in the setting of hypoxemia.Suspicion is that he likely aspirated resulting in hypoxemia leading to mental status decline.  Though seizure was mentioned as a possibility, this is not from definite.  Workup fors a questionable first time seizure with CTH, routine EEG and LP are non revealing. No indication to start AEDs at this time. Do not feel strongly about MRI brain at this time since he is clinically doing so much better and has no focal deficit.  Recommendations  - no further inpatient neurological workup. We will signoff. Please feel free to contact us with any questions or concerns. ______________________________________________________________________   Thank you for the opportunity to take part in the care of this patient. If you have any further questions, please contact the neurology consultation attending.  Signed,  [place] Pager Number [phone]

## 2022-09-03 NOTE — Progress Notes (Signed)
PT Cancellation Note  Patient Details Name: [name] Gibson MRN: [id number] DOB: [birth year]   Cancelled Treatment:    Reason Eval/Treat Not Completed: Patient not medically ready.  RN held pt due to still on vent, will advise on plan for extubation later today. 09/03/2022  Ginger Carne., PT Acute Rehabilitation Services [phone]  (office)   Tessie Fass Jonathon Tan 09/03/2022, 10:27 AM

## 2022-09-03 NOTE — Progress Notes (Addendum)
NAME:  [name] Gibson, MRN:  [id number], DOB:  August 29, 1941, LOS: 2 ADMISSION DATE:  09/01/2022, CONSULTATION DATE:  09/01/22 REFERRING MD:  McDiarmid CHIEF COMPLAINT:  AMS   History of Present Illness:  [name] Gibson is a 81 y.o. male who has a PMH as below. He presented to [facility] ED 9/25 with R knee pain after he sustained a mechanical fall a few weeks prior. Pain did not resolve so he came to ED for further evaluation. Since that initial fall, he has had an additional 3-4 falls due to weakness. No trauma or LOC on any of the falls.  Had apparently also had cough and cold for 2 weeks and was exposed to COVID at his church. Denied any fevers, myalgias but did report some chills, diaphoresis, and diarrhea x 2 over the 2 days prior to ED presentation.  He was found to have AKI with rhabdo, transaminitis and possible R liver mass. He was initially admitted by FMTS for further evaluation and management.  After he was admitted, he ate dinner and was later found in ED stretcher minimally responsive, hypoxic to 60s and 70s and tachypneic to 30s and 40s. Prior to this, he was on room air and was alert and oriented and able to communicate appropriately. He was also noted to have fever to 103. There was concern for some tremor like activity prior to FMTS being contacted.    PCCM called to see in consultation given acute change. During my evaluation, it was decided that best course of action would be to proceed with intubation which EDP graciously assisted with. Prior to intubation, he was noted to have bilateral upper extremity jerking along with horizontal nystagmus and horizontal side to side head shaking. This resolved after a few seconds.  Neurology was consulted and saw pt in ED. He has CT head pending and will likely get EEG as well as consideration for lumbar puncture.   Pertinent  Medical History:  has COLONIC POLYPS; HYPERTHYROIDISM; Hyperlipidemia; GLAUCOMA; Primary hypertension; SINUSITIS- ACUTE-NOS;  URI; DIVERTICULOSIS OF COLON; PSA, INCREASED; History of prostate cancer; GLUCOSE INTOLERANCE, HX OF; Obesity (BMI 30-39.9); Dizziness; Preventative health care; Traumatic hematoma of head; Pain in upper jaw; Thyroid function study abnormality; Insomnia; AKI (acute kidney injury) ([place]); Atherosclerosis of aorta (HCC); PAD (peripheral artery disease) ([place]); Generalized weakness; Impaired ambulation; Pain in right knee; Hypokalemia; Elevated creatine kinase; Transaminitis; Hypotension; Irregular cardiac rhythm; Dyspnea; Liver mass, right lobe;  Bilateral perinephric fat stranding CT; Hypermagnesemia; Fall; Encephalopathy acute; Acute respiratory failure with hypoxia ([place]); Atelectasis of left lung; and Malnutrition of moderate degree on their problem list.  Significant Hospital Events: Including procedures, antibiotic start and stop dates in addition to other pertinent events   9/25 admitted. Acute change in mental status along with hypoxia and resp failure requiring intubation. Unclear whether had aspiration event followed by seizures and post ictal state, versus seizures as initial inticing event. 9/26 LP and EEG completed overnight both negative. Remains on vent this am with high FIO2 needs of 80%  Interim History / Subjective:  Sedated on vent, awake and alert and can follow commands. Febrile overnight to Tmax 103.29F, last fever at midnight. Had a few BP with MAPS <65, but this morning MAP 786. Echocardiogram shows EF 50% with global hypokinesis and G2 DD.  Objective:  Blood pressure 123/86, pulse 80, temperature (!) 97.5 F (36.4 C), resp. rate (!) 23, height '5\' 4"'$  (1.626 m), weight 79 kg, SpO2 98 %.    Vent Mode: PRVC  FiO2 (%):  [30 %-60 %] 30 % Set Rate:  [15 bmp] 15 bmp Vt Set:  [470 mL] 470 mL PEEP:  [5 cmH20-8 cmH20] 5 cmH20 Plateau Pressure:  [19 cmH20-20 cmH20] 20 cmH20   Intake/Output Summary (Last 24 hours) at 09/03/2022 0945 Last data filed at 09/03/2022 0900 Gross per 24 hour   Intake 3501.73 ml  Output 1400 ml  Net 2101.73 ml   Filed Weights   09/01/22 1054 09/02/22 0515 09/03/22 0500  Weight: 83 kg 77.2 kg 79 kg   On PRVC PEEP: 8 FiO2 40-> 30% TV 470  Labs: Mg 2.6 Na 139 K+ 3.3, Cr 1.09, BUN 39, GFR >50 AST/ALT 110/135 Phos 2.8 CK 1841  Examination: General: Acute on chronically ill appearing elderly male lying in bed on mechanical ventilation, in NAD HEENT: ETT, MM pink/moist, PERRL,  Neuro: Sedated on vent, able to follow commands CV: s1s2 regular rate and rhythm, no murmur, rubs, or gallops,  PULM:  Clear to ascultation, tolerating vent, no increased work of breathing  GI: soft, bowel sounds active in all 4 quadrants, non-tender, non-distended Extremities: warm/dry, no edema  Skin: no rashes or lesions  Resolved problems     Assessment & Plan:  Acute hypoxic respiratory failure  Moderate left pleural effusion Intubated and sedated. Presumed 2/2 aspiration event in ED. Now s/p intubation in ED. Febrile overnight while on Cefepime. MRSA PCR negative and Vanc d/c. No concern for urine source, DVT as source of fever.  - Continue IV Cefepime (Start 9/26) - Repeat CXR this am - F/u resp culture - Lactic acid  Acute Hypoxic Respiratory Failure Tolerated PRVC -> PS. Awake, alert, following commands. Appropriate SpO2. - SBT trial w/ extubation if tolerated well - Continue fentanyl gtt - Continue versed 1 mg q2 hr PRN to maintain RASS goal - Continue ventilator support with lung protective strategies  - Wean PEEP and FiO2 for sats greater than 90%.  Possible new onset seizures Prior to intubation, reportedly had bilateral upper extremity jerking along with horizontal nystagmus and horizontal side to side head shaking. This resolved after a few seconds. No further seizure-like activity since then. EEG negative. LP non-concerning for meningitis. - Neurology following, appreciate assistance   - Maintain neuro protective measures; goal for  eurothermia, euglycemia, eunatermia, normoxia, and PCO2 goal of 35-40 - Nutrition and bowel regiment  - Seizure precautions  - Aspirations precautions   AKI - improved  Hypokalemia. Rhabdomyolysis. Hypocalcemia. Acute Kidney Injury  In the setting of critical illness, creatinine on admission 2.50 with GFR 25, creatinine in March 2023 1.19 with GFR estimated 56 Kidney function stable today with appropriate UOP. - Follow renal function  - Monitor urine output - Trend Bmet - Avoid nephrotoxins - Ensure adequate renal perfusion   Transaminitis  Unclear etiology, possibly hepatic congestion vs intrahepatic process given incidental finding of R liver mass on imaging. Hepatitis panel negative. R liver mass  Incidental finding on CT chest/abd/pelvis. - Avoid hepatotoxins - Trend LFTs - O/p f/u for mass  Hx HTN Continue to hold home antihypertensives  Multiple recent falls. PT/OT evals when able  Best practice (evaluated daily):  Diet/type: NPO DVT prophylaxis: LMWH GI prophylaxis: PPI Lines: N/A Foley:  N/A Code Status:  full code Last date of multidisciplinary goals of care discussion: None yet.     Orvis Brill, DO PGY-2 [facility] 09/03/2022, 9:45 AM

## 2022-09-03 NOTE — Progress Notes (Addendum)
Per MD pt was extubated '@1207'$  and foley removed. Pt on 2L [place]

## 2022-09-03 NOTE — Progress Notes (Signed)
OT Cancellation Note  Patient Details Name: [name] Gibson MRN: [id number] DOB: [birth year]   Cancelled Treatment:    Reason Eval/Treat Not Completed: Patient not medically ready (Pt still on vent, plan for extubation later today. Will follow.)  Malka So 09/03/2022, 10:48 AM Cleta Alberts, OTR/L Acute Rehabilitation Services Office: [phone]

## 2022-09-03 NOTE — Progress Notes (Signed)
  Transition of Care [facility]) Screening Note   Patient Details  Name: [name] Gibson Date of Birth: [birth year]   Transition of Care [facility]) CM/SW Contact:    Tom-Johnson, Renea Ee, RN Phone Number: 09/03/2022, 2:04 PM  Patient is admitted for Acute Renal Failure, Rhabdomyolysis, Weakness and Falls. Found to have possible right liver mass. Was intubated, extubated today, on 3L O2. Neurology following. Renal function and CK are improving. Neurology, Pulmonary following.  Transition of Care Department [facility]) has reviewed patient and no TOC needs or recommendations have been identified at this time. Awaits PT/OT eval. CM will continue to follow as patient progresses with care towards discharge.

## 2022-09-03 NOTE — Procedures (Signed)
Extubation Procedure Note  Patient Details:   Name: [name] Gibson DOB: 1941/06/27 MRN: [id number]   Airway Documentation:    Vent end date: 09/03/22 Vent end time: 1205   Evaluation  O2 sats: stable throughout Complications: No apparent complications Patient did tolerate procedure well. Bilateral Breath Sounds: Clear, Diminished   Yes  RT extubated patient to 3L [place] per MD order with RN at bedside. Positive cuff leak noted. Patient tolerated well. No stridor noted at this time. RT will continue to monitor as needed.   Fabiola Backer 09/03/2022, 1:56 PM

## 2022-09-04 ENCOUNTER — Ambulatory Visit: Payer: Medicare Other | Admitting: Family Medicine

## 2022-09-04 DIAGNOSIS — N179 Acute kidney failure, unspecified: Secondary | ICD-10-CM | POA: Diagnosis not present

## 2022-09-04 DIAGNOSIS — J9600 Acute respiratory failure, unspecified whether with hypoxia or hypercapnia: Secondary | ICD-10-CM | POA: Diagnosis not present

## 2022-09-04 LAB — COMPREHENSIVE METABOLIC PANEL
ALT: 111 U/L — ABNORMAL HIGH (ref 0–44)
AST: 119 U/L — ABNORMAL HIGH (ref 15–41)
Albumin: 2.1 g/dL — ABNORMAL LOW (ref 3.5–5.0)
Alkaline Phosphatase: 120 U/L (ref 38–126)
Anion gap: 11 (ref 5–15)
BUN: 27 mg/dL — ABNORMAL HIGH (ref 8–23)
CO2: 29 mmol/L (ref 22–32)
Calcium: 8.8 mg/dL — ABNORMAL LOW (ref 8.9–10.3)
Chloride: 100 mmol/L (ref 98–111)
Creatinine, Ser: 0.96 mg/dL (ref 0.61–1.24)
GFR, Estimated: >60 mL/min (ref >60)
Glucose, Bld: 115 mg/dL — ABNORMAL HIGH (ref 70–99)
Potassium: 3.3 mmol/L — ABNORMAL LOW (ref 3.5–5.1)
Sodium: 140 mmol/L (ref 135–145)
Total Bilirubin: 1.4 mg/dL — ABNORMAL HIGH (ref 0.3–1.2)
Total Protein: 5.6 g/dL — ABNORMAL LOW (ref 6.5–8.1)

## 2022-09-04 LAB — CULTURE, RESPIRATORY W GRAM STAIN: Culture: NORMAL

## 2022-09-04 LAB — CBC
HCT: 30.4 % — ABNORMAL LOW (ref 39.0–52.0)
Hemoglobin: 11.1 g/dL — ABNORMAL LOW (ref 13.0–17.0)
MCH: 33.2 pg (ref 26.0–34.0)
MCHC: 36.5 g/dL — ABNORMAL HIGH (ref 30.0–36.0)
MCV: 91 fL (ref 80.0–100.0)
Platelets: 121 10*3/uL — ABNORMAL LOW (ref 150–400)
RBC: 3.34 MIL/uL — ABNORMAL LOW (ref 4.22–5.81)
RDW: 14.7 % (ref 11.5–15.5)
WBC: 15.1 10*3/uL — ABNORMAL HIGH (ref 4.0–10.5)
nRBC: 0 % (ref 0.0–0.2)

## 2022-09-04 LAB — CULTURE, BLOOD (ROUTINE X 2): Special Requests: ADEQUATE

## 2022-09-04 LAB — GLUCOSE, CAPILLARY
Glucose-Capillary: 114 mg/dL — ABNORMAL HIGH (ref 70–99)
Glucose-Capillary: 94 mg/dL (ref 70–99)
Glucose-Capillary: 114 mg/dL — ABNORMAL HIGH (ref 70–99)

## 2022-09-04 LAB — MAGNESIUM: Magnesium: 2.5 mg/dL — ABNORMAL HIGH (ref 1.7–2.4)

## 2022-09-04 MED ORDER — ENSURE ENLIVE PO LIQD
237.0000 mL | Freq: Three times a day (TID) | ORAL | Status: DC
  Administered 2022-09-04 – 2022-09-07 (×6): 237 mL via ORAL

## 2022-09-04 MED ORDER — POTASSIUM CHLORIDE CRYS ER 20 MEQ PO TBCR
20.0000 meq | EXTENDED_RELEASE_TABLET | ORAL | Status: AC
  Administered 2022-09-04 (×2): 20 meq via ORAL
  Filled 2022-09-04 (×2): qty 1

## 2022-09-04 MED ORDER — POTASSIUM CHLORIDE 10 MEQ/50ML IV SOLN
10.0000 meq | INTRAVENOUS | Status: AC
  Administered 2022-09-04 (×4): 10 meq via INTRAVENOUS
  Filled 2022-09-04 (×3): qty 50

## 2022-09-04 NOTE — Progress Notes (Signed)
New onset atrial flutter. [place] notified, EKG completed. Printed and placed in patient's chart.

## 2022-09-04 NOTE — Progress Notes (Signed)
Patient sent to [facility] admission to FMTS with need for intubation secondary to respiratory failure and subsequent extubation while in ICU. Stable today still with fever of unknown origin, treated with cefepime. Likely stable for continued work up on floor tomorrow. Spoke with Dr. Owens Shark with ICU, will pick patient up tomorrow on floor at 0700 09/05/22 unless he experiences acute changes throughout the course of the day that require higher level of care.    Erskine Emery, MD  FMTS

## 2022-09-04 NOTE — Progress Notes (Signed)
Initial Nutrition Assessment  DOCUMENTATION CODES:   Non-severe (moderate) malnutrition in context of chronic illness  INTERVENTION:   - Ensure Enlive po TID, each supplement provides 350 kcal and 20 grams of protein  - Encourage PO intake  NUTRITION DIAGNOSIS:   Moderate Malnutrition related to chronic illness (prostate cancer) as evidenced by moderate fat depletion, moderate muscle depletion.  Ongoing, being addressed via oral nutrition supplements  GOAL:   Patient will meet greater than or equal to 90% of their needs  Progressing  MONITOR:   Vent status, Labs, Weight trends, TF tolerance, I & O's  REASON FOR ASSESSMENT:   Ventilator, Consult Enteral/tube feeding initiation and management  ASSESSMENT:   81 year old male who presented to the ED on 9/25 with R knee pain, weakness. PMH of HTN, HLD, hypothyroidism, prostate cancer. Pt admitted with AKI on CKD stage IIIa.  09/25 - increased WOB requiring intubation, seizure-like activity, s/p lumbar puncture 09/26 - s/p bronchoscopy, Cortrak placed (tip gastric) 09/27 - extubated, diet advanced to regular 09/28 - Cortrak removed  Discussed pt with RN and during ICU rounds. Per RN, Cortrak removed this morning and pt consumed "majority" of breakfast meal. Spoke with pt at bedside. Pt reports that he ate most of his breakfast meal tray and that there was way more food than what he usually eats. Pt is willing to consume Ensure supplements and states that he likes the chocolate flavor. RD to order.  First measured weight: 77.2 kg Current weight: 78.4 kg  Medications reviewed and include: colace, protonix, miralax, klor-con 20 mEq x 2, IV abx, IV KCl 10 mEq x 4 runs  Labs reviewed: potassium 3.3, BUN 27, magnesium 2.6, elevated LFTs, WBC 15.1, platelets 121 CBG's: 94-148 x 24 hours  UOP: 1800 ml x 24 hours I/O's: +2.0 L since admit  Diet Order:   Diet Order             Diet regular Room service appropriate? Yes;  Fluid consistency: Thin  Diet effective now                   EDUCATION NEEDS:   No education needs have been identified at this time  Skin:  Skin Assessment: Reviewed RN Assessment  Last BM:  09/03/22  Height:   Ht Readings from Last 1 Encounters:  09/02/22 '5\' 4"'$  (1.626 m)    Weight:   Wt Readings from Last 1 Encounters:  09/04/22 78.4 kg    BMI:  Body mass index is 29.67 kg/m.  Estimated Nutritional Needs:   Kcal:  1700-1900  Protein:  95-110 grams  Fluid:  1.7-1.9 L    Gustavus Bryant, MS, RD, LDN Inpatient Clinical Dietitian Please see [facility] for contact information.

## 2022-09-04 NOTE — Progress Notes (Signed)
    Durable Medical Equipment  (From admission, onward)           Start     Ordered   09/04/22 1530  For home use only DME Walker rolling  Once       Question Answer Comment  Walker: With [place] Wheels   Patient needs a walker to treat with the following condition Gait instability      09/04/22 1529   09/04/22 1529  For home use only DME Bedside commode  Once       Question:  Patient needs a bedside commode to treat with the following condition  Answer:  Weakness   09/04/22 1529

## 2022-09-04 NOTE — Progress Notes (Addendum)
NAME:  [name] Gibson, MRN:  [id number], DOB:  1941/03/14, LOS: 3 ADMISSION DATE:  09/01/2022, CONSULTATION DATE:  09/01/22 REFERRING MD:  McDiarmid CHIEF COMPLAINT:  AMS   History of Present Illness:  [name] Gibson is a 81 y.o. male who has a PMH as below. He presented to [facility] ED 9/25 with R knee pain after he sustained a mechanical fall a few weeks prior. Pain did not resolve so he came to ED for further evaluation. Since that initial fall, he has had an additional 3-4 falls due to weakness. No trauma or LOC on any of the falls.  Had apparently also had cough and cold for 2 weeks and was exposed to COVID at his church. Denied any fevers, myalgias but did report some chills, diaphoresis, and diarrhea x 2 over the 2 days prior to ED presentation.  He was found to have AKI with rhabdo, transaminitis and possible R liver mass. He was initially admitted by FMTS for further evaluation and management.  After he was admitted, he ate dinner and was later found in ED stretcher minimally responsive, hypoxic to 60s and 70s and tachypneic to 30s and 40s. Prior to this, he was on room air and was alert and oriented and able to communicate appropriately. He was also noted to have fever to 103. There was concern for some tremor like activity prior to FMTS being contacted.    PCCM called to see in consultation given acute change. During my evaluation, it was decided that best course of action would be to proceed with intubation which EDP graciously assisted with. Prior to intubation, he was noted to have bilateral upper extremity jerking along with horizontal nystagmus and horizontal side to side head shaking. This resolved after a few seconds.  Neurology was consulted and saw pt in ED. He has CT head pending and will likely get EEG as well as consideration for lumbar puncture.   Pertinent  Medical History:  has COLONIC POLYPS; HYPERTHYROIDISM; Hyperlipidemia; GLAUCOMA; Primary hypertension; SINUSITIS- ACUTE-NOS;  URI; DIVERTICULOSIS OF COLON; PSA, INCREASED; History of prostate cancer; GLUCOSE INTOLERANCE, HX OF; Obesity (BMI 30-39.9); Dizziness; Preventative health care; Traumatic hematoma of head; Pain in upper jaw; Thyroid function study abnormality; Insomnia; AKI (acute kidney injury) ([place]); Atherosclerosis of aorta (HCC); PAD (peripheral artery disease) ([place]); Generalized weakness; Impaired ambulation; Pain in right knee; Hypokalemia; Elevated creatine kinase; Transaminitis; Hypotension; Irregular cardiac rhythm; Dyspnea; Liver mass, right lobe;  Bilateral perinephric fat stranding CT; Hypermagnesemia; Fall; Encephalopathy acute; Acute respiratory failure ([place]); Atelectasis of left lung; and Malnutrition of moderate degree on their problem list.  Significant Hospital Events: Including procedures, antibiotic start and stop dates in addition to other pertinent events   9/25 admitted. Acute change in mental status along with hypoxia and resp failure requiring intubation. Unclear whether had aspiration event followed by seizures and post ictal state, versus seizures as initial inticing event. 9/26 LP and EEG completed overnight both negative. Remains on vent this am with high FIO2 needs of 80% 9/27 Extubated. CXR w/ improved left lower lung aeration.  Interim History / Subjective:  No overnight events. This AM, febrile to 100.6 F. No complaints. No chest pain, abdominal pain, shortness of breath. Alert and oriented. Able to eat yesterday without difficulty.  Objective:  Blood pressure 135/73, pulse (!) 104, temperature (!) 100.6 F (38.1 C), temperature source Oral, resp. rate (!) 22, height '5\' 4"'$  (1.626 m), weight 78.4 kg, SpO2 93 %.     Intake/Output Summary (Last 24  hours) at 09/04/2022 1002 Last data filed at 09/04/2022 0744 Gross per 24 hour  Intake 1018.71 ml  Output 1850 ml  Net -831.29 ml   Filed Weights   09/02/22 0515 09/03/22 0500 09/04/22 0500  Weight: 77.2 kg 79 kg 78.4 kg   Labs: Na  140 K+ 3.3, Kidney function much improved: Cr 0.96, BUN 27, GFR >60 AST/ALT 119/111 WBC 15  Examination: General: Appears in NAD, alert, oriented, talking, comfortable in bed HEENT: MMM, pupils PERRLA, EOMI Neuro: A&O x4, speech clear and fluent CV: s1s2 regular rate and rhythm, no murmur, rubs, or gallops,  PULM:  Clear to ascultation, speaks in full sentences, no increased work of breathing MSK: Bilateral knees non-tender without warmth, effusion. No joint line tenderness. GI: soft, bowel sounds active in all 4 quadrants, non-tender, non-distended Extremities: warm/dry, no edema  Skin: no rashes or lesions  Resolved problems   Encephalopathy and poor airway protection Acute hypoxic respiratory failure  Assessment & Plan:  Acute hypoxic respiratory failure  Extubated yesterday 9/27. CXR clear. Treated empirically for possible aspiration pneumonia with Cefepime, and did have aspiration event in ED although not likely this was an actual pneumonia given he is on room air without cough, sputum production and CXR without signs of pneumonia. No sign of knee effusion/septic arthritis; knee pain has resolved. No risk factors for pseudomonal infection.  Awake, alert, oriented this morning x4. - Stable for transfer out of ICU; transfer orders placed - D/c IV Cefepime  New onset atrial flutter Seen on telemetry and EKG overnight. Rate controlled this morning. Given presentation with possible TIA vs seizure, would consider need for cardiology consultation. - May need cardiology consult  Possible new onset seizures No further seizure-like activity. No indication to start AED or obtain brain MRI at this time given non-revealing workup (EEG, LP, CTH) - Neurology signed off - No further inpatient neurological workup  Acute renal failure Rhabdomyolysis In the setting of critical illness, creatinine on admission 2.50 with GFR 25, creatinine in March 2023 1.19 with GFR estimated 56. CK on  admission >9,000 and downtrended to 1,841 yesterday. Kidney function much improved today with appropriate UOP. - Follow renal function  - Monitor urine output - Trend Bmet - Avoid nephrotoxins - Ensure adequate renal perfusion   Hypokalemia K+ 3.3 this morning - Replete with 20 mEq x2.  Transaminitis  Unclear etiology, possibly hepatic congestion vs intrahepatic process given incidental finding of R liver mass on imaging. Hepatitis panel negative. R liver mass  Incidental finding on CT chest/abd/pelvis. - Avoid hepatotoxins - Trend LFTs - O/p f/u for mass - Consider MRI while inpatinet  Hx HTN Resume home Amlodipine 10 mg daily and Lopressor 25 mg BID.  Multiple recent falls. PT/OT evals when able  Best practice (evaluated daily):  Diet/type: NPO DVT prophylaxis: LMWH GI prophylaxis: PPI Lines: N/A Foley:  N/A Code Status:  full code Last date of multidisciplinary goals of care discussion: None yet.     Orvis Brill, DO PGY-2 [facility] 09/04/2022, 10:02 AM

## 2022-09-04 NOTE — Evaluation (Signed)
Occupational Therapy Evaluation Patient Details Name: [name] Gibson MRN: [id number] DOB: 1941/09/05 Today's Date: 09/04/2022   History of Present Illness 81 yo admitted 9/25 with rt knee pain and AKI. Abrupt change in ED with respiratory distress in ED and intubated 9/25-9/28. PMHx: prostate CA, HTn, HLD, hyperthyroidism   Clinical Impression   Pt was independent prior to admission. He presents with decreased problem solving and safety awareness, impaired standing balance, generalized weakness and decreased activity tolerance. Pt with HR to 146 bpm with activity with pt reporting fatigue after toileting and deferred further ambulation. Pt requires min assist and RW for OOB mobility and set up to min assist for ADLs. Pt reports his daughter is coming from [place] and will care for him at home as long as is needed. Recommending HHOT.      Recommendations for follow up therapy are one component of a multi-disciplinary discharge planning process, led by the attending physician.  Recommendations may be updated based on patient status, additional functional criteria and insurance authorization.   Follow Up Recommendations  Home health OT    Assistance Recommended at Discharge Frequent or constant Supervision/Assistance  Patient can return home with the following A little help with walking and/or transfers;A little help with bathing/dressing/bathroom;Assistance with cooking/housework;Assist for transportation;Help with stairs or ramp for entrance    Functional Status Assessment  Patient has had a recent decline in their functional status and demonstrates the ability to make significant improvements in function in a reasonable and predictable amount of time.  Equipment Recommendations  BSC/3in1 (pt is considering a shower seat)    Recommendations for Other Services       Precautions / Restrictions Precautions Precautions: Fall Precaution Comments: watch HR      Mobility Bed  Mobility Overal bed mobility: Needs Assistance Bed Mobility: Supine to Sit     Supine to sit: Min assist, HOB elevated          Transfers Overall transfer level: Needs assistance Equipment used: Rolling walker (2 wheels) Transfers: Sit to/from Stand Sit to Stand: Min assist           General transfer comment: pt with posterior bias upon intially standing with pt stabilizing back of LEs on bed      Balance Overall balance assessment: Needs assistance   Sitting balance-Leahy Scale: Good Sitting balance - Comments: no LOB donning sock at EOB     Standing balance-Leahy Scale: Poor Standing balance comment: reliant on B UE support to ambulate, can releas walker in static standing                           ADL either performed or assessed with clinical judgement   ADL Overall ADL's : Needs assistance/impaired Eating/Feeding: Independent;Sitting   Grooming: Wash/dry hands;Sitting;Set up   Upper Body Bathing: Minimal assistance;Sitting   Lower Body Bathing: Minimal assistance;Sit to/from stand   Upper Body Dressing : Minimal assistance;Sitting   Lower Body Dressing: Minimal assistance;Sitting/lateral leans Lower Body Dressing Details (indicate cue type and reason): pt donned R sock, OT donned L as he was fatigued Toilet Transfer: Minimal assistance;Ambulation;BSC/3in1;Rolling walker (2 wheels)   Toileting- Clothing Manipulation and Hygiene: Minimal assistance;Sit to/from stand Toileting - Clothing Manipulation Details (indicate cue type and reason): assist for standing balance, pt performed his own pericare in standing     Functional mobility during ADLs: Minimal assistance;Rolling walker (2 wheels) General ADL Comments: HR to 146 with activity  Vision Ability to See in Adequate Light: 0 Adequate Patient Visual Report: No change from baseline       Perception     Praxis      Pertinent Vitals/Pain Pain Assessment Pain Assessment: No/denies  pain     Hand Dominance Right   Extremity/Trunk Assessment Upper Extremity Assessment Upper Extremity Assessment: Overall WFL for tasks assessed;Generalized weakness   Lower Extremity Assessment Lower Extremity Assessment: Defer to PT evaluation   Cervical / Trunk Assessment Cervical / Trunk Assessment: Kyphotic   Communication Communication Communication: HOH   Cognition Arousal/Alertness: Awake/alert Behavior During Therapy: WFL for tasks assessed/performed Overall Cognitive Status: Impaired/Different from baseline Area of Impairment: Problem solving, Safety/judgement                         Safety/Judgement: Decreased awareness of safety, Decreased awareness of deficits   Problem Solving: Decreased initiation General Comments: Pt in bed saturated with urine, had not called for assistance. Pt does not recall events of hospital. Difficulty communicating timeline of when he fell and his R knee pain.     General Comments       Exercises     Shoulder Instructions      Home Living Family/patient expects to be discharged to:: Private residence Living Arrangements: Alone Available Help at Discharge: Family;Available 24 hours/day (daughter is coming from out of town) Type of Home: Apartment Home Access: Level entry     Home Layout: One level     Bathroom Shower/Tub: Teacher, early years/pre: [place]: None          Prior Functioning/Environment Prior Level of Function : Independent/Modified Independent                        OT Problem List: Decreased strength;Decreased activity tolerance;Impaired balance (sitting and/or standing);Decreased cognition;Decreased safety awareness;Decreased knowledge of use of DME or AE;Cardiopulmonary status limiting activity      OT Treatment/Interventions: Self-care/ADL training;DME and/or AE instruction;Therapeutic activities;Cognitive remediation/compensation;Patient/family  education;Balance training    OT Goals(Current goals can be found in the care plan section) Acute Rehab OT Goals OT Goal Formulation: With patient Time For Goal Achievement: 09/18/22 Potential to Achieve Goals: Good ADL Goals Pt Will Perform Grooming: with supervision;standing Pt Will Perform Lower Body Bathing: with supervision;sit to/from stand Pt Will Perform Lower Body Dressing: with supervision;sit to/from stand Pt Will Transfer to Toilet: with supervision;ambulating;bedside commode Pt Will Perform Toileting - Clothing Manipulation and hygiene: with supervision;sit to/from stand Additional ADL Goal #1: Pt will gather items necessary for ADLs around his room with supervision and RW. Additional ADL Goal #2: Pt will state at least 3 fall prevention strategies as instructed.  OT Frequency: Min 2X/week    Co-evaluation              AM-PAC OT "6 Clicks" Daily Activity     Outcome Measure Help from another person eating meals?: None Help from another person taking care of personal grooming?: A Little Help from another person toileting, which includes using toliet, bedpan, or urinal?: A Little Help from another person bathing (including washing, rinsing, drying)?: A Little Help from another person to put on and taking off regular upper body clothing?: A Little Help from another person to put on and taking off regular lower body clothing?: A Little 6 Click Score: 19   End of Session Equipment Utilized During Treatment: Gait belt;Rolling walker (2 wheels)  Nurse Communication: Other (comment) (sacral patch soiled)  Activity Tolerance: Patient tolerated treatment well Patient left: in chair;with call bell/phone within reach  OT Visit Diagnosis: Unsteadiness on feet (R26.81);Other abnormalities of gait and mobility (R26.89);Muscle weakness (generalized) (M62.81);History of falling (Z91.81);Cognitive communication deficit (R41.841)                Time: 2230-0979 OT Time Calculation  (min): 40 min Charges:  OT General Charges $OT Visit: 1 Visit OT Evaluation $OT Eval Moderate Complexity: [place], OTR/L Acute Rehabilitation Services Office: [phone]  Malka So 09/04/2022, 1:07 PM

## 2022-09-04 NOTE — Evaluation (Signed)
Physical Therapy Evaluation Patient Details Name: [name] Gibson MRN: [id number] DOB: [birth year] Today's Date: 09/04/2022  History of Present Illness  81 yo admitted 9/25 with rt knee pain and AKI. Abrupt change in ED with respiratory distress in ED and intubated 9/25-9/28. PMHx: prostate CA, HTn, HLD, hyperthyroidism  Clinical Impression  Pt admitted with/for weakness, R knee pain and AKI.  Presently, pt is weak with balance issues and needing min overall and mod assist initially for gait due to heavy retropulsion..  Pt currently limited functionally due to the problems listed below.  (see problems list.)  Pt will benefit from PT to maximize function and safety to be able to get home safely with available assist .        Recommendations for follow up therapy are one component of a multi-disciplinary discharge planning process, led by the attending physician.  Recommendations may be updated based on patient status, additional functional criteria and insurance authorization.  Follow Up Recommendations Home health PT      Assistance Recommended at Discharge Intermittent Supervision/Assistance  Patient can return home with the following  A little help with walking and/or transfers;A little help with bathing/dressing/bathroom;Assistance with cooking/housework;Assist for transportation;Help with stairs or ramp for entrance    Equipment Recommendations Rolling walker (2 wheels)  Recommendations for Other Services       Functional Status Assessment Patient has had a recent decline in their functional status and demonstrates the ability to make significant improvements in function in a reasonable and predictable amount of time.     Precautions / Restrictions Precautions Precautions: Fall Precaution Comments: watch HR      Mobility  Bed Mobility Overal bed mobility: Needs Assistance Bed Mobility: Supine to Sit     Supine to sit: Min assist, HOB elevated           Transfers Overall transfer level: Needs assistance Equipment used: Rolling walker (2 wheels) Transfers: Sit to/from Stand Sit to Stand: Min assist           General transfer comment: pt with posterior bias upon intially standing with pt stabilizing back of LEs on bed    Ambulation/Gait Ambulation/Gait assistance: Mod assist, Min assist (initially mod assist and retropulsive.) Gait Distance (Feet): 15 Feet (x2) Assistive device: Rolling walker (2 wheels) Gait Pattern/deviations: Step-through pattern   Gait velocity interpretation: <1.31 ft/sec, indicative of household ambulator   General Gait Details: unsteady, short steps initially, retropulsive, but steadily improving with time up.  Stairs            Wheelchair Mobility    Modified Rankin (Stroke Patients Only)       Balance Overall balance assessment: Needs assistance Sitting-balance support: No upper extremity supported, Feet supported, Feet unsupported Sitting balance-Leahy Scale: Good Sitting balance - Comments: no LOB donning sock at EOB     Standing balance-Leahy Scale: Poor Standing balance comment: reliant on B UE support to ambulate, can releas walker in static standing                             Pertinent Vitals/Pain Pain Assessment Pain Assessment: No/denies pain    Home Living Family/patient expects to be discharged to:: Private residence Living Arrangements: Alone Available Help at Discharge: Family;Available 24 hours/day (daughter is coming from out of town) Type of Home: Apartment Home Access: Level entry       Home Layout: One [place]: None      Prior Function  Prior Level of Function : Independent/Modified Independent                     Hand Dominance   Dominant Hand: Right    Extremity/Trunk Assessment   Upper Extremity Assessment Upper Extremity Assessment: Overall WFL for tasks assessed    Lower Extremity Assessment Lower  Extremity Assessment: Generalized weakness    Cervical / Trunk Assessment Cervical / Trunk Assessment: Kyphotic  Communication   Communication: HOH  Cognition Arousal/Alertness: Awake/alert Behavior During Therapy: WFL for tasks assessed/performed Overall Cognitive Status: Impaired/Different from baseline Area of Impairment: Problem solving, Safety/judgement                         Safety/Judgement: Decreased awareness of safety, Decreased awareness of deficits   Problem Solving: Decreased initiation General Comments: Pt in bed saturated with urine, had not called for assistance. Pt does not recall events of hospital. Difficulty communicating timeline of when he fell and his R knee pain.        General Comments General comments (skin integrity, edema, etc.): HR rose into the 130's during activity otherwise stable otherwise.    Exercises     Assessment/Plan    PT Assessment Patient needs continued PT services  PT Problem List Decreased strength;Decreased activity tolerance;Decreased balance;Decreased mobility;Decreased knowledge of use of DME       PT Treatment Interventions Gait training;Functional mobility training;Therapeutic activities;Patient/family education;Balance training;DME instruction;Stair training    PT Goals (Current goals can be found in the Care Plan section)  Acute Rehab PT Goals Patient Stated Goal: get home soon PT Goal Formulation: With patient Time For Goal Achievement: 09/18/22 Potential to Achieve Goals: Good    Frequency Min 3X/week     Co-evaluation PT/OT/SLP Co-Evaluation/Treatment: Yes Reason for Co-Treatment: For patient/therapist safety PT goals addressed during session: Mobility/safety with mobility         AM-PAC PT "6 Clicks" Mobility  Outcome Measure Help needed turning from your back to your side while in a flat bed without using bedrails?: A Little Help needed moving from lying on your back to sitting on the side of  a flat bed without using bedrails?: A Little Help needed moving to and from a bed to a chair (including a wheelchair)?: A Little Help needed standing up from a chair using your arms (e.g., wheelchair or bedside chair)?: A Little Help needed to walk in hospital room?: A Lot Help needed climbing 3-5 steps with a railing? : A Little 6 Click Score: 17    End of Session   Activity Tolerance: Patient tolerated treatment well Patient left: in chair;with call bell/phone within reach Nurse Communication: Mobility status PT Visit Diagnosis: Unsteadiness on feet (R26.81);Muscle weakness (generalized) (M62.81)    Time: 2423-5361 PT Time Calculation (min) (ACUTE ONLY): 39 min   Charges:   PT Evaluation $PT Eval Moderate Complexity: 1 Mod PT Treatments $Gait Training: 8-22 mins        09/04/2022  Ginger Carne., PT Acute Rehabilitation Services [phone]  (office)  Tessie Fass Eliza Green 09/04/2022, 2:15 PM

## 2022-09-04 NOTE — Progress Notes (Signed)
[facility] ADULT ICU REPLACEMENT PROTOCOL   The patient does apply for the [facility] Adult ICU Electrolyte Replacment Protocol based on the criteria listed below:   1.Exclusion criteria: TCTS patients, ECMO patients, and Dialysis patients 2. Is GFR >/= 30 ml/min? Yes.    Patient's GFR today is >60 3. Is SCr </= 2? Yes.   Patient's SCr is 0.96 mg/dL 4. Did SCr increase >/= 0.5 in 24 hours? No. 5.Pt's weight >40kg  Yes.   6. Abnormal electrolyte(s): K 3.3  7. Electrolytes replaced per protocol 8.  Call MD STAT for K+ </= 2.5, Phos </= 1, or Mag </= 1 Physician:    Ronda Fairly A 09/04/2022 4:24 AM

## 2022-09-04 NOTE — TOC Initial Note (Signed)
Transition of Care [facility]) - Initial/Assessment Note    Patient Details  Name: [name] Gibson MRN: [id number] Date of Birth: 10/29/1941  [facility] CM/SW Contact:    Tom-Johnson, Renea Ee, RN Phone Number: 09/04/2022, 3:32 PM  Clinical Narrative:                  CM spoke with patient at bedside about needs for post hospital transition. Admitted for AKI/ Respiratory distress. Intubated, extubated on 09/27. Currently on room air. From home alone. Has one supportive daughter. Independent with care and drive self prior to admission. Does not have DME's at home. BSC and RW ordered from [employer] and [place] to deliver at bedside. Home health referral called in to [facility] and [place] voiced acceptance, info on AVS.  PCP is Carollee Herter, Alferd Apa, DO and uses [employer] on [facility].  Daughter to transport at discharge. CM will continue to follow as patient progresses with care towards discharge.    Expected Discharge Plan: [place] Barriers to Discharge: Continued Medical Work up   Patient Goals and CMS Choice Patient states their goals for this hospitalization and ongoing recovery are:: To return home CMS Medicare.gov Compare Post Acute Care list provided to:: Patient Choice offered to / list presented to : Patient  Expected Discharge Plan and Services Expected Discharge Plan: [place]   Discharge Planning Services: CM Consult Post Acute Care Choice: [place] arrangements for the past 2 months: Apartment                 DME Arranged: Bedside commode, Walker rolling DME Agency: [employer] Date DME Agency Contacted: 09/04/22 Time DME Agency Contacted: [id number] Representative spoke with at DME Agency: [place] Arranged: PT, OT          Prior Living Arrangements/Services Living arrangements for the past 2 months: Apartment Lives with:: Self Patient language and need for interpreter reviewed:: Yes Do you  feel safe going back to the place where you live?: Yes      Need for Family Participation in Patient Care: Yes (Comment) Care giver support system in place?: Yes (comment)   Criminal Activity/Legal Involvement Pertinent to Current Situation/Hospitalization: No - Comment as needed  Activities of Daily Living      Permission Sought/Granted Permission sought to share information with : Case Manager, Customer service manager, Family Supports Permission granted to share information with : Yes, Verbal Permission Granted              Emotional Assessment Appearance:: Appears stated age Attitude/Demeanor/Rapport: Engaged, Gracious Affect (typically observed): Accepting, Appropriate, Calm, Hopeful, Pleasant Orientation: : Oriented to Self, Oriented to Place, Oriented to Situation, Oriented to  Time Alcohol / Substance Use: Not Applicable Psych Involvement: No (comment)  Admission diagnosis:  Hypermagnesemia [E83.41] Transaminitis [R74.01] Acute renal failure (ARF) (HCC) [N17.9] Acute encephalopathy [G93.40] AKI (acute kidney injury) ([place]) [N17.9] Fall, initial encounter [W19.XXXA] Acute pain of right knee [M25.561] Patient Active Problem List   Diagnosis Date Noted   Malnutrition of moderate degree 09/02/2022   Atelectasis of left lung    AKI (acute kidney injury) ([place]) 09/01/2022   Atherosclerosis of aorta ([place]) 09/01/2022   PAD (peripheral artery disease) ([place]) 09/01/2022   Generalized weakness 09/01/2022   Impaired ambulation 09/01/2022   Hypokalemia 09/01/2022   Elevated creatine kinase 09/01/2022   Transaminitis 09/01/2022   Hypotension 09/01/2022   Irregular cardiac rhythm 09/01/2022   Dyspnea 09/01/2022  Liver mass, right lobe 09/01/2022    Bilateral perinephric fat stranding CT 09/01/2022   Hypermagnesemia 09/01/2022   Encephalopathy acute 09/01/2022   Fall    Acute respiratory failure ([place])    Insomnia 10/29/2020   Thyroid function study abnormality  03/13/2020   Pain in upper jaw 03/30/2019   Pain in right knee 10/05/2018   Traumatic hematoma of head 09/28/2018   Preventative health care 03/29/2016   Dizziness 01/22/2016   Obesity (BMI 30-39.9) 12/07/2013   History of prostate cancer 10/02/2010   PSA, INCREASED 08/08/2009   URI 12/13/2008   SINUSITIS- ACUTE-NOS 09/12/2008   COLONIC POLYPS 10/22/2007   DIVERTICULOSIS OF COLON 10/22/2007   Primary hypertension 09/08/2007   HYPERTHYROIDISM 03/23/2007   Hyperlipidemia 03/23/2007   GLAUCOMA 03/23/2007   GLUCOSE INTOLERANCE, HX OF 03/23/2007   PCP:  [name], DO Pharmacy:   [facility] [place], [address] [place] [address] Phone: [phone] Fax: [phone]     Social Determinants of Health (SDOH) Interventions    Readmission Risk Interventions     No data to display

## 2022-09-05 ENCOUNTER — Telehealth: Payer: Self-pay | Admitting: Family Medicine

## 2022-09-05 DIAGNOSIS — I4892 Unspecified atrial flutter: Secondary | ICD-10-CM | POA: Diagnosis not present

## 2022-09-05 DIAGNOSIS — I10 Essential (primary) hypertension: Secondary | ICD-10-CM | POA: Diagnosis not present

## 2022-09-05 DIAGNOSIS — I5032 Chronic diastolic (congestive) heart failure: Secondary | ICD-10-CM | POA: Diagnosis not present

## 2022-09-05 DIAGNOSIS — R509 Fever, unspecified: Secondary | ICD-10-CM

## 2022-09-05 DIAGNOSIS — R590 Localized enlarged lymph nodes: Secondary | ICD-10-CM

## 2022-09-05 LAB — COMPREHENSIVE METABOLIC PANEL
ALT: 130 U/L — ABNORMAL HIGH (ref 0–44)
AST: 125 U/L — ABNORMAL HIGH (ref 15–41)
Albumin: 2.2 g/dL — ABNORMAL LOW (ref 3.5–5.0)
Alkaline Phosphatase: 153 U/L — ABNORMAL HIGH (ref 38–126)
Anion gap: 8 (ref 5–15)
BUN: 21 mg/dL (ref 8–23)
CO2: 26 mmol/L (ref 22–32)
Calcium: 8.7 mg/dL — ABNORMAL LOW (ref 8.9–10.3)
Chloride: 102 mmol/L (ref 98–111)
Creatinine, Ser: 0.81 mg/dL (ref 0.61–1.24)
GFR, Estimated: >60 mL/min (ref >60)
Glucose, Bld: 128 mg/dL — ABNORMAL HIGH (ref 70–99)
Potassium: 3.4 mmol/L — ABNORMAL LOW (ref 3.5–5.1)
Sodium: 136 mmol/L (ref 135–145)
Total Bilirubin: 1 mg/dL (ref 0.3–1.2)
Total Protein: 6.1 g/dL — ABNORMAL LOW (ref 6.5–8.1)

## 2022-09-05 LAB — CBC
HCT: 32.5 % — ABNORMAL LOW (ref 39.0–52.0)
Hemoglobin: 11.6 g/dL — ABNORMAL LOW (ref 13.0–17.0)
MCH: 33 pg (ref 26.0–34.0)
MCHC: 35.7 g/dL (ref 30.0–36.0)
MCV: 92.6 fL (ref 80.0–100.0)
Platelets: 187 10*3/uL (ref 150–400)
RBC: 3.51 MIL/uL — ABNORMAL LOW (ref 4.22–5.81)
RDW: 14.9 % (ref 11.5–15.5)
WBC: 13.4 10*3/uL — ABNORMAL HIGH (ref 4.0–10.5)
nRBC: 0 % (ref 0.0–0.2)

## 2022-09-05 LAB — CSF CULTURE W GRAM STAIN: Culture: NO GROWTH

## 2022-09-05 LAB — GLUCOSE, CAPILLARY
Glucose-Capillary: 105 mg/dL — ABNORMAL HIGH (ref 70–99)
Glucose-Capillary: 105 mg/dL — ABNORMAL HIGH (ref 70–99)
Glucose-Capillary: 105 mg/dL — ABNORMAL HIGH (ref 70–99)
Glucose-Capillary: 98 mg/dL (ref 70–99)

## 2022-09-05 MED ORDER — POTASSIUM CHLORIDE 20 MEQ PO PACK
40.0000 meq | PACK | Freq: Once | ORAL | Status: AC
  Administered 2022-09-05: 40 meq via ORAL
  Filled 2022-09-05: qty 2

## 2022-09-05 MED ORDER — APIXABAN 5 MG PO TABS
5.0000 mg | ORAL_TABLET | Freq: Two times a day (BID) | ORAL | Status: DC
  Administered 2022-09-05 – 2022-09-07 (×4): 5 mg via ORAL
  Filled 2022-09-05 (×4): qty 1

## 2022-09-05 NOTE — Consult Note (Addendum)
Cardiology Consultation   Patient ID: [name] Gibson MRN: [id number]; DOB: Apr 21, 1941  Admit date: 09/01/2022 Date of Consult: 09/05/2022  PCP:  Lowne Chase, [place] Providers Cardiologist:  Donato Heinz, MD previously Dr. Acie Fredrickson (2017)  Patient Profile:   [name] Gibson is a 81 y.o. male with a hx of hypertension, hyperlipidemia, CKD 3A, and obesity who is being seen 09/05/2022 for the evaluation of atrial flutter at the request of Dr. Thompson Grayer.  History of Present Illness:   [name] Gibson was seen by Dr. Acie Fredrickson in 2017 for atypical chest pain and dizziness.  He had a Myoview stress test that was normal with no evidence of ischemia and normal LV function.    He is now maintained on aspirin, 20 mg Lipitor, amlodipine 10 mg, chlorthalidone 25 mg daily, losartan 100 mg daily, Toprol 200 mg.    He presented to [facility] ED via EMS for generalized fatigue and weakness and ongoing pain in his right knee for the past 2 months.  At baseline he lives alone and does not ambulate with a walker or cane.  He suffered a mechanical fall several weeks prior to admission.  Since the initial fall he has had an additional 3-4 falls due to weakness.  He denies trauma or loss of consciousness with any of the falls.   Patient was noted to be on multiple hypertensive agents.  He also reported having URI for 2 weeks and was exposed to [place] at church.  He had diarrhea x2 days  He was found to have an AKI and hypokalemia along with hypotension likely related to diarrhea in the setting of multiple antihypertensives.  He was also found to have rhabdomyolysis, transaminitis and possible right liver mass.  In the ER, he was responsive and on room air.  Unfortunately after eating dinner he was found minimally responsive and hypoxic to the 60s and 70s with tachypnea in the 30s to 40s.  He was febrile to 103 and there was mention of some possible tremor-like activity.  Given acute AMS, PCCM was  consulted.  Neurology was also consulted.  Neuro work-up was largely normal, LP and EEG unrevealing.  No concern for seizures, no anti-seizure medication was started.  Patient was briefly intubated for acute hypoxic respiratory failure, extubated on 09/03/2022.  Chest x-ray was clear.  He was treated empirically for possible aspiration pneumonia. AKI and rhabdo have improved with IVF resuscitation. He had transaminitis and new finding of liver mass.  No source for fever was found.   Telemetry concerning for brief atrial flutter 09/04/22. EKG evidence of atrial flutter with variable conduction and ventricular rate of 83. Hypertension treated with 10 mg amlodipine and started on 25 mg metoprolol BID.    Echocardiogram 09/02/2022 showed an LVEF of 50%, grade 2 DD, normal RV function, and aortic root dilatation of 43 mm.  DVT study was negative.   Cardiology was consulted for new onset atrial flutter. He is oriented to person, place, and situation, but is not a good historian. He reports falling once about every 6-8 months, generally a mechanical fall. He denies history of MI, CVA, and prior arrhythmias. He is unaware of his atrial flutter. Telemetry with fairly good rate control. He denies chest pain, dyspnea, orthopnea, PND, and lower extremity edema.    Past Medical History:  Diagnosis Date   Acute pain of right knee 09/28/2018   ANGIOEDEMA 08/25/2007   Qualifier: Diagnosis of  By: Etter Sjogren  DO, Tacey Heap PAIN, RIGHT 11/16/2008   Qualifier: Diagnosis of  By: Jerold Coombe     Atherosclerosis of aorta ([place]) 09/01/2022   Cancer ([place])    prostate   COLONIC POLYPS 10/22/2007   Qualifier: Diagnosis of  By: Jerold Coombe     DIVERTICULOSIS OF COLON 10/22/2007   Qualifier: Diagnosis of  By: Jerold Coombe     Generalized weakness 09/01/2022   GLAUCOMA 03/23/2007   Qualifier: Diagnosis of  By: Etter Sjogren DO, Yvonne     GLUCOSE INTOLERANCE, HX OF 03/23/2007   Qualifier: Diagnosis of  By: Jerold Coombe     HYPERLIPIDEMIA 03/23/2007   Qualifier: Diagnosis of  By: Jerold Coombe     HYPERTENSION 09/08/2007   Qualifier: Diagnosis of  By: Jerold Coombe     HYPERTHYROIDISM 03/23/2007   Qualifier: Diagnosis of  By: Jerold Coombe  - no med   Obesity (BMI 30-39.9) 12/07/2013   PAD (peripheral artery disease) ([place]) 09/01/2022   PROSTATE CANCER, HX OF 10/02/2010   Qualifier: Diagnosis of  By: Jerold Coombe     Right shoulder pain 02/08/2015   Sebaceous cyst 12/13/2008   Qualifier: Diagnosis of  By: Jerold Coombe      Past Surgical History:  Procedure Laterality Date   APPENDECTOMY     radioactive seed implation     prostate     Home Medications:  Prior to Admission medications   Medication Sig Start Date End Date Taking? Authorizing Provider  amLODipine (NORVASC) 10 MG tablet TAKE 1 TABLET(10 MG) BY MOUTH DAILY Patient taking differently: Take 10 mg by mouth daily. 07/04/22  Yes [name], DO  aspirin EC 325 MG tablet Take 325 mg by mouth every other day.   Yes [provider]  atorvastatin (LIPITOR) 20 MG tablet Take 1 tablet (20 mg total) by mouth daily. 08/29/22  Yes [name], DO  bimatoprost (LUMIGAN) 0.01 % SOLN Place 1 drop into both eyes at bedtime.   Yes [provider]  chlorthalidone (HYGROTON) 25 MG tablet TAKE 1 TABLET(25 MG) BY MOUTH DAILY Patient taking differently: Take 25 mg by mouth daily. 04/07/22  Yes Lowne Chase, Yvonne R, DO  fluticasone (FLONASE) 50 MCG/ACT nasal spray SHAKE LIQUID AND USE 2 SPRAYS IN EACH NOSTRIL DAILY AS NEEDED FOR ALLERGIES Patient taking differently: Place 2 sprays into both nostrils daily as needed for allergies. 05/14/22  Yes [name], DO  losartan (COZAAR) 100 MG tablet Take 1 tablet (100 mg total) by mouth daily. 12/26/21  Yes [name], DO  metoprolol (TOPROL-XL) 200 MG 24 hr tablet TAKE 1 TABLET(200 MG) BY MOUTH DAILY Patient taking differently: Take 200 mg by mouth  daily. 07/04/22  Yes [name], DO  Omega-3 Fatty Acids (FISH OIL) 1360 MG CAPS Take 1 capsule (1,360 mg total) by mouth daily. 09/28/18  Yes [name], DO  COVID-19 mRNA bivalent vaccine, Pfizer, injection Inject into the muscle. 08/29/21   Carlyle Basques, MD  COVID-19 mRNA Vac-TriS, Pfizer, (PFIZER-BIONT COVID-19 VAC-TRIS) SUSP injection Inject into the muscle. 04/29/21   Carlyle Basques, MD  tiZANidine (ZANAFLEX) 4 MG tablet Take 1 tablet (4 mg total) by mouth every 6 (six) hours as needed for muscle spasms. Patient not taking: Reported on 09/01/2022 12/07/19   [name], DO  traZODone (DESYREL) 50 MG tablet Take 0.5-1 tablets (25-50 mg total)  by mouth at bedtime as needed for sleep. Patient not taking: Reported on 09/01/2022 10/29/20   [name], DO  Zoster Vaccine Adjuvanted [facility]) injection Inject into the muscle. 03/31/22   Carlyle Basques, MD    Inpatient Medications: Scheduled Meds:  amLODipine  10 mg Oral Daily   enoxaparin (LOVENOX) injection  40 mg Subcutaneous Q24H   feeding supplement  237 mL Oral TID BM   latanoprost  1 drop Both Eyes QHS   metoprolol tartrate  25 mg Oral BID   potassium chloride  40 mEq Oral Once   sodium chloride flush  3 mL Intravenous Q12H   Continuous Infusions:  sodium chloride Stopped (09/02/22 1707)   PRN Meds: sodium chloride, acetaminophen, mouth rinse  Allergies:    Allergies  Allergen Reactions   Penicillins     REACTION: unspecified Has patient had a PCN reaction causing immediate rash, facial/tongue/throat swelling, SOB or lightheadedness with hypotension: N Has patient had a PCN reaction causing severe rash involving mucus membranes or skin necrosis: N Has patient had a PCN reaction that required hospitalization: N Has patient had a PCN reaction occurring within the last 10 years: N If all of the above answers are "NO", then may proceed with Cephalosporin use.     Social History:    Social History   Socioeconomic History   Marital status: Divorced    Spouse name: Not on file   Number of children: Not on file   Years of education: Not on file   Highest education level: Not on file  Occupational History   Occupation: Diplomatic Services operational officer: [employer]  Tobacco Use   Smoking status: Never   Smokeless tobacco: Never  Substance and Sexual Activity   Alcohol use: Not Currently    Alcohol/week: 1.0 standard drink of alcohol    Types: 1 Shots of liquor per week   Drug use: No   Sexual activity: Yes    Partners: Female  Other Topics Concern   Not on file  Social History Narrative   Reg exercise--walking 2-3 x a week   Social Determinants of Radio broadcast assistant Strain: Not on file  Food Insecurity: Not on file  Transportation Needs: Not on file  Physical Activity: Not on file  Stress: Not on file  Social Connections: Not on file  Intimate Partner Violence: Not on file    Family History:    Family History  Problem Relation Age of Onset   Brain cancer Brother    Diabetes Other    Hyperlipidemia Other    Hypertension Other    Coronary artery disease Other    Cancer Father        spine   Stomach cancer Sister    Kidney failure Brother    Diabetes Mellitus II Brother    Throat cancer Brother    Lung cancer Brother    Stroke Brother    Stroke Brother    Stroke Sister    Breast cancer Sister    Colon cancer Maternal Grandmother      ROS:  Please see the history of present illness.   All other ROS reviewed and negative.     Physical Exam/Data:   Vitals:   09/04/22 2341 09/05/22 0500 09/05/22 0548 09/05/22 0756  BP: 116/68  120/70 127/68  Pulse: 82  78 78  Resp:   20 17  Temp: 100 F (37.8 C)  98.7 F (37.1 C) 99.1 F (37.3 C)  TempSrc:  Oral  Oral Oral  SpO2:   99% 99%  Weight:  81.4 kg    Height:        Intake/Output Summary (Last 24 hours) at 09/05/2022 1432 Last data filed at 09/05/2022 1156 Gross per 24 hour  Intake  120 ml  Output 1400 ml  Net -1280 ml      09/05/2022    5:00 AM 09/04/2022    5:00 AM 09/03/2022    5:00 AM  Last 3 Weights  Weight (lbs) 179 lb 7.3 oz 172 lb 13.5 oz 174 lb 2.6 oz  Weight (kg) 81.4 kg 78.4 kg 79 kg     Body mass index is 30.8 kg/m.  General:  Well nourished, well developed, in no acute distress HEENT: normal Neck: no JVD Vascular: No carotid bruits; Distal pulses 2+ bilaterally Cardiac:  irregular rhythm, regular rate Lungs:  clear to auscultation bilaterally, no wheezing, rhonchi or rales  Abd: soft, nontender, no hepatomegaly  Ext: no edema Musculoskeletal:  No deformities, BUE and BLE strength normal and equal Skin: warm and dry  Neuro:  CNs 2-12 intact, no focal abnormalities noted Psych:  Normal affect   EKG:  The EKG was personally reviewed and demonstrates:  atrial flutter with variable conduction, ventricular rate 83 Telemetry:  Telemetry was personally reviewed and demonstrates:  atrial flutter with ventricular rate in the 80-90s  Relevant CV Studies:  Echo 09/02/22: 1. Left ventricular ejection fraction, by estimation, is 50%. The left  ventricle has mildly decreased function. The left ventricle demonstrates  global hypokinesis. There is moderate asymmetric left ventricular  hypertrophy of the septal segment. Left  ventricular diastolic parameters are consistent with Grade II diastolic  dysfunction (pseudonormalization).   2. Right ventricular systolic function is normal. The right ventricular  size is normal. Tricuspid regurgitation signal is inadequate for assessing  PA pressure.   3. The mitral valve is normal in structure. No evidence of mitral valve  regurgitation. No evidence of mitral stenosis.   4. The aortic valve is tricuspid. There is mild calcification of the  aortic valve. There is mild thickening of the aortic valve. Aortic valve  regurgitation is trivial. Aortic valve sclerosis/calcification is present,  without any evidence of  aortic  stenosis. Decrease LV stroke volume index with normal DVI   5. Aortic dilatation noted. There is mild dilatation of the aortic root,  measuring 43 mm.   Laboratory Data:  High Sensitivity Troponin:   Recent Labs  Lab 09/01/22 1020 09/01/22 1218  TROPONINIHS 113* 106*     Chemistry Recent Labs  Lab 09/02/22 1650 09/03/22 0516 09/04/22 0318 09/04/22 1252 09/05/22 1001  NA 139 139 140  --  136  K 3.2* 3.3* 3.3*  --  3.4*  CL 104 104 100  --  102  CO2 '27 27 29  '$ --  26  GLUCOSE 151* 179* 115*  --  128*  BUN 36* 39* 27*  --  21  CREATININE 0.99 1.09 0.96  --  0.81  CALCIUM 8.5* 8.2* 8.8*  --  8.7*  MG 2.5* 2.6*  --  2.5*  --   GFRNONAA >60 >60 >60  --  >60  ANIONGAP '8 8 11  '$ --  8    Recent Labs  Lab 09/03/22 0516 09/04/22 0318 09/05/22 1001  PROT 5.7* 5.6* 6.1*  ALBUMIN 2.1* 2.1* 2.2*  AST 135* 119* 125*  ALT 110* 111* 130*  ALKPHOS 103 120 153*  BILITOT 0.9 1.4* 1.0   Lipids No  results for input(s): "CHOL", "TRIG", "HDL", "LABVLDL", "LDLCALC", "CHOLHDL" in the last 168 hours.  Hematology Recent Labs  Lab 09/03/22 0516 09/04/22 0318 09/05/22 1001  WBC 16.5* 15.1* 13.4*  RBC 3.44* 3.34* 3.51*  HGB 11.3* 11.1* 11.6*  HCT 32.0* 30.4* 32.5*  MCV 93.0 91.0 92.6  MCH 32.8 33.2 33.0  MCHC 35.3 36.5* 35.7  RDW 15.1 14.7 14.9  PLT 103* 121* 187   Thyroid  Recent Labs  Lab 09/02/22 0448  TSH 0.997    BNPNo results for input(s): "BNP", "PROBNP" in the last 168 hours.  DDimer No results for input(s): "DDIMER" in the last 168 hours.   Radiology/Studies:  DG CHEST PORT 1 VIEW  Result Date: 09/03/2022 CLINICAL DATA:  ETT, pneumonia EXAM: PORTABLE CHEST 1 VIEW COMPARISON:  Radiograph 09/02/2022 FINDINGS: Endotracheal tube overlies the trachea approximately 4.8 cm above the carina. Feeding tube tip passes below the diaphragm, tip excluded by collimation. Unchanged cardiomediastinal silhouette. There is interval improvement in aeration in the left lower  lung without evidence of pleural effusion. Unchanged metallic marker overlying the right basilar lung. No evidence of pneumothorax. No acute osseous abnormality. IMPRESSION: Improved left lower lung aeration without evidence of pleural effusion. ETT overlies the trachea approximately 4.8 cm above the carina. Electronically Signed   By: Maurine Simmering M.D.   On: 09/03/2022 11:18   ECHOCARDIOGRAM COMPLETE  Result Date: 09/02/2022    ECHOCARDIOGRAM REPORT   Patient Name:   [name] Gibson Date of Exam: 09/02/2022 Medical Rec #:  [id number]      Height:       64.0 in Accession #:    [id number]     Weight:       170.2 lb Date of Birth:  09-28-1941      BSA:          1.827 m Patient Age:    18 years       BP:           100/67 mmHg Patient Gender: M              HR:           65 bpm. Exam Location:  Inpatient Procedure: 2D Echo, Cardiac Doppler, Color Doppler and Intracardiac            Opacification Agent Indications:    Dyspnea  History:        Patient has no prior history of Echocardiogram examinations.                 Risk Factors:Dyslipidemia. AKI.  Sonographer:    Clayton Lefort RDCS (AE) Referring Phys: [phone] [facility]  Sonographer Comments: Echo performed with patient supine and on artificial respirator. IMPRESSIONS  1. Left ventricular ejection fraction, by estimation, is 50%. The left ventricle has mildly decreased function. The left ventricle demonstrates global hypokinesis. There is moderate asymmetric left ventricular hypertrophy of the septal segment. Left ventricular diastolic parameters are consistent with Grade II diastolic dysfunction (pseudonormalization).  2. Right ventricular systolic function is normal. The right ventricular size is normal. Tricuspid regurgitation signal is inadequate for assessing PA pressure.  3. The mitral valve is normal in structure. No evidence of mitral valve regurgitation. No evidence of mitral stenosis.  4. The aortic valve is tricuspid. There is mild calcification of the  aortic valve. There is mild thickening of the aortic valve. Aortic valve regurgitation is trivial. Aortic valve sclerosis/calcification is present, without any evidence of aortic stenosis. Decrease LV stroke volume  index with normal DVI  5. Aortic dilatation noted. There is mild dilatation of the aortic root, measuring 43 mm. Comparison(s): No prior Echocardiogram. FINDINGS  Left Ventricle: Left ventricular ejection fraction, by estimation, is 50%. The left ventricle has mildly decreased function. The left ventricle demonstrates global hypokinesis. Definity contrast agent was given IV to delineate the left ventricular endocardial borders. The left ventricular internal cavity size was normal in size. There is moderate asymmetric left ventricular hypertrophy of the septal segment. Left ventricular diastolic parameters are consistent with Grade II diastolic dysfunction (pseudonormalization). Right Ventricle: The right ventricular size is normal. No increase in right ventricular wall thickness. Right ventricular systolic function is normal. Tricuspid regurgitation signal is inadequate for assessing PA pressure. Left Atrium: Left atrial size was normal in size. Right Atrium: Right atrial size was normal in size. Pericardium: There is no evidence of pericardial effusion. Mitral Valve: The mitral valve is normal in structure. No evidence of mitral valve regurgitation. No evidence of mitral valve stenosis. Tricuspid Valve: The tricuspid valve is normal in structure. Tricuspid valve regurgitation is not demonstrated. No evidence of tricuspid stenosis. Aortic Valve: The aortic valve is tricuspid. There is mild calcification of the aortic valve. There is mild thickening of the aortic valve. There is mild aortic valve annular calcification. Aortic valve regurgitation is trivial. Aortic valve sclerosis/calcification is present, without any evidence of aortic stenosis. Aortic valve mean gradient measures 1.0 mmHg. Aortic valve  peak gradient measures 2.7 mmHg. Aortic valve area, by VTI measures 2.76 cm. Pulmonic Valve: The pulmonic valve was normal in structure. Pulmonic valve regurgitation is not visualized. No evidence of pulmonic stenosis. Aorta: Aortic dilatation noted. There is mild dilatation of the aortic root, measuring 43 mm. IAS/Shunts: No atrial level shunt detected by color flow Doppler.  LEFT VENTRICLE PLAX 2D LVIDd:         4.20 cm   Diastology LVIDs:         2.70 cm   LV e' medial:    2.93 cm/s LV PW:         1.10 cm   LV E/e' medial:  21.0 LV IVS:        1.30 cm   LV e' lateral:   7.03 cm/s LVOT diam:     2.10 cm   LV E/e' lateral: 8.7 LV SV:         44 LV SV Index:   24 LVOT Area:     3.46 cm  RIGHT VENTRICLE            IVC RV Basal diam:  3.50 cm    IVC diam: 2.00 cm RV S prime:     5.91 cm/s TAPSE (M-mode): 1.2 cm LEFT ATRIUM             Index        RIGHT ATRIUM           Index LA diam:        3.00 cm 1.64 cm/m   RA Area:     16.50 cm LA Vol (A2C):   28.8 ml 15.77 ml/m  RA Volume:   43.40 ml  23.76 ml/m LA Vol (A4C):   37.0 ml 20.26 ml/m LA Biplane Vol: 36.1 ml 19.76 ml/m  AORTIC VALVE AV Area (Vmax):    2.82 cm AV Area (Vmean):   2.70 cm AV Area (VTI):     2.76 cm AV Vmax:           82.50 cm/s AV  Vmean:          55.000 cm/s AV VTI:            0.158 m AV Peak Grad:      2.7 mmHg AV Mean Grad:      1.0 mmHg LVOT Vmax:         67.20 cm/s LVOT Vmean:        42.800 cm/s LVOT VTI:          0.126 m LVOT/AV VTI ratio: 0.80  AORTA Ao Root diam: 4.30 cm Ao Asc diam:  3.60 cm MITRAL VALVE MV Area (PHT): 3.93 cm    SHUNTS MV Decel Time: 193 msec    Systemic VTI:  0.13 m MV E velocity: 61.40 cm/s  Systemic Diam: 2.10 cm MV A velocity: 48.70 cm/s MV E/A ratio:  1.26 Rudean Haskell MD Electronically signed by Rudean Haskell MD Signature Date/Time: 09/02/2022/2:15:06 PM    Final    VAS [place] LOWER EXTREMITY VENOUS (DVT)  Result Date: 09/02/2022  Lower Venous DVT Study Patient Name:  [name] Gibson  Date of  Exam:   09/02/2022 Medical Rec #: [id number]       Accession #:    [id number] Date of Birth: 1941-10-08       Patient Gender: M Patient Age:   20 years Exam Location:  [facility] Procedure:      VAS [place] LOWER EXTREMITY VENOUS (DVT) Referring Phys: TODD MCDIARMID --------------------------------------------------------------------------------  Indications: Pain.  Risk Factors: None identified. Comparison Study: No prior studies. Performing Technologist: Oliver Hum RVT  Examination Guidelines: A complete evaluation includes B-mode imaging, spectral Doppler, color Doppler, and power Doppler as needed of all accessible portions of each vessel. Bilateral testing is considered an integral part of a complete examination. Limited examinations for reoccurring indications may be performed as noted. The reflux portion of the exam is performed with the patient in reverse Trendelenburg.  +---------+---------------+---------+-----------+----------+--------------+ RIGHT    CompressibilityPhasicitySpontaneityPropertiesThrombus Aging +---------+---------------+---------+-----------+----------+--------------+ CFV      Full           Yes      Yes                                 +---------+---------------+---------+-----------+----------+--------------+ SFJ      Full                                                        +---------+---------------+---------+-----------+----------+--------------+ FV Prox  Full                                                        +---------+---------------+---------+-----------+----------+--------------+ FV Mid   Full                                                        +---------+---------------+---------+-----------+----------+--------------+ FV DistalFull                                                        +---------+---------------+---------+-----------+----------+--------------+  PFV      Full                                                         +---------+---------------+---------+-----------+----------+--------------+ POP      Full           Yes      Yes                                 +---------+---------------+---------+-----------+----------+--------------+ PTV      Full                                                        +---------+---------------+---------+-----------+----------+--------------+ PERO     Full                                                        +---------+---------------+---------+-----------+----------+--------------+   +----+---------------+---------+-----------+----------+--------------+ LEFTCompressibilityPhasicitySpontaneityPropertiesThrombus Aging +----+---------------+---------+-----------+----------+--------------+ CFV Full           Yes      Yes                                 +----+---------------+---------+-----------+----------+--------------+     Summary: RIGHT: - There is no evidence of deep vein thrombosis in the lower extremity.  - No cystic structure found in the popliteal fossa.  LEFT: - No evidence of common femoral vein obstruction.  *See table(s) above for measurements and observations. Electronically signed by Deitra Mayo MD on 09/02/2022 at 1:08:41 PM.    Final    DG Abd Portable 1V  Result Date: 09/02/2022 CLINICAL DATA:  Evaluate feeding tube placement EXAM: PORTABLE ABDOMEN - 1 VIEW COMPARISON:  09/02/2022 FINDINGS: There is a feeding tube with tip below the GE junction in the projection of the body of stomach. No dilated bowel loops identified. Left pleural effusion is again noted. IMPRESSION: Feeding tube tip is in the body of the stomach. Electronically Signed   By: Kerby Moors M.D.   On: 09/02/2022 10:57   DG CHEST PORT 1 VIEW  Result Date: 09/02/2022 CLINICAL DATA:  81 year old male status post central line placement. EXAM: PORTABLE CHEST 1 VIEW COMPARISON:  Chest x-ray 09/02/2022 at 4:29 a.m. FINDINGS: Previously noted left IJ central  venous catheter has been partially withdrawn, now with tip in the midline in the expected location of the innominate vein. An endotracheal tube is in place with tip 4.8 cm above the carina. Lung volumes remain low with persistent opacity at the left base which may reflect atelectasis and/or consolidation with superimposed moderate left pleural effusion. Right lung is clear. No right pleural effusion. No pneumothorax. No evidence of pulmonary edema. Heart size is mildly enlarged. Upper mediastinal contours are within normal limits. Atherosclerotic calcifications in the thoracic aorta. IMPRESSION: 1. Support apparatus, as above. 2. Low lung volumes with persistent atelectasis and/or consolidation in the left lower lobe with  superimposed moderate left pleural effusion. 3. Aortic catheter sclerosus. 4. Mild cardiomegaly. Electronically Signed   By: Vinnie Langton M.D.   On: 09/02/2022 05:22   DG CHEST PORT 1 VIEW  Result Date: 09/02/2022 CLINICAL DATA:  81 year old male status post central line placement. EXAM: PORTABLE CHEST 1 VIEW COMPARISON:  Chest x-ray 09/02/2022. FINDINGS: An endotracheal tube is in place with tip 4.7 cm above the carina. New left internal jugular central venous catheter with tip extending across the midline then directed cephalad into the expected location of the right subclavian or right internal jugular vein. Lung volumes are low. However, there is improved aeration throughout the left mid to upper lung with persistent opacity at the left base which may reflect persistent atelectasis and/or consolidation along with a superimposed moderate left pleural effusion. Right lung is clear. No right pleural effusion. No pneumothorax. No evidence of pulmonary edema. Heart size is mildly enlarged. Upper mediastinal contours are within normal limits. Atherosclerotic calcifications are noted in the thoracic aorta. IMPRESSION: 1. Support apparatus, as above. Please take note of the malpositioned left  internal jugular central venous catheter. 2. Improving aeration in the left lung with residual atelectasis and/or consolidation in the left lung base and superimposed moderate left pleural effusion. 3. Mild cardiomegaly. 4. Aortic atherosclerosis. Electronically Signed   By: Vinnie Langton M.D.   On: 09/02/2022 05:21   EEG adult  Result Date: 09/02/2022 Greta Doom, MD     09/02/2022  7:27 AM History: 81 yo M with acute encephalopathy Sedation: propofol Technique: This EEG was acquired with electrodes placed according to the International 10-20 electrode system (including Fp1, Fp2, F3, F4, C3, C4, P3, P4, O1, O2, T3, T4, T5, T6, A1, A2, Fz, Cz, Pz). The following electrodes were missing or displaced: none. Background: The background consists of low voltage irregular delta activity with occasional sleep spindles. This pattern was present throughout the recording. No waking state or epileptiform discharge was seen. Photic stimulation: Physiologic driving is none EEG Abnormalities: sedated EEG Clinical Interpretation: This EEG is consistent with the patient's sedated state. There was no seizure or seizure predisposition recorded on this study. Please note that lack of epileptiform activity on EEG does not preclude the possibility of epilepsy. Roland Rack, MD [facility] [phone] If 7pm- 7am, please page neurology on call as listed in [place].   DG CHEST PORT 1 VIEW  Addendum Date: 09/02/2022   ADDENDUM REPORT: 09/02/2022 02:01 ADDENDUM: Critical findings were reported to PA Rahul Desai at 2:01 a.m. Electronically Signed   By: Brett Fairy M.D.   On: 09/02/2022 02:01   Result Date: 09/02/2022 CLINICAL DATA:  OG tube placement. EXAM: PORTABLE ABDOMEN - 1 VIEW; PORTABLE CHEST - 1 VIEW COMPARISON:  09/01/2022. FINDINGS: Cardiac silhouette and mediastinal structures are obscured. There is complete opacification of the left lung with mediastinal shift to the left. Mild fibrotic  changes are present at the right lung base. A stable clip is noted over the right lower lung field. An endotracheal tube terminates 1.4 cm above the carina. The mid to lower enteric tube is shifted to the left which may be due to mediastinal shift. An enteric tube terminates over the left upper quadrant. The bowel gas pattern is normal. No radio-opaque calculi or other significant radiographic abnormality are seen. IMPRESSION: 1. Complete opacification of the left lung with mediastinal shift to the left, new from the previous exam. Enteric tube terminates over the left upper quadrant and may terminate in the stomach, however  location is not confidently 2. Enteric tube terminates over the left upper quadrant, however the mid to distal enteric tube projects over the left hemithorax, which may be due to mediastinal shift to the left. The possibility of pulmonary location can not be completely excluded and repeat evaluation or repositioning is recommended. Electronically Signed: By: Brett Fairy M.D. On: 09/02/2022 01:47   DG Abd Portable 1V  Addendum Date: 09/02/2022   ADDENDUM REPORT: 09/02/2022 02:01 ADDENDUM: Critical findings were reported to PA Rahul Desai at 2:01 a.m. Electronically Signed   By: Brett Fairy M.D.   On: 09/02/2022 02:01   Result Date: 09/02/2022 CLINICAL DATA:  OG tube placement. EXAM: PORTABLE ABDOMEN - 1 VIEW; PORTABLE CHEST - 1 VIEW COMPARISON:  09/01/2022. FINDINGS: Cardiac silhouette and mediastinal structures are obscured. There is complete opacification of the left lung with mediastinal shift to the left. Mild fibrotic changes are present at the right lung base. A stable clip is noted over the right lower lung field. An endotracheal tube terminates 1.4 cm above the carina. The mid to lower enteric tube is shifted to the left which may be due to mediastinal shift. An enteric tube terminates over the left upper quadrant. The bowel gas pattern is normal. No radio-opaque calculi or  other significant radiographic abnormality are seen. IMPRESSION: 1. Complete opacification of the left lung with mediastinal shift to the left, new from the previous exam. Enteric tube terminates over the left upper quadrant and may terminate in the stomach, however location is not confidently 2. Enteric tube terminates over the left upper quadrant, however the mid to distal enteric tube projects over the left hemithorax, which may be due to mediastinal shift to the left. The possibility of pulmonary location can not be completely excluded and repeat evaluation or repositioning is recommended. Electronically Signed: By: Brett Fairy M.D. On: 09/02/2022 01:47   CT Head Wo Contrast  Result Date: 09/01/2022 CLINICAL DATA:  Neuro deficit, acute, stroke suspected EXAM: CT HEAD WITHOUT CONTRAST TECHNIQUE: Contiguous axial images were obtained from the base of the skull through the vertex without intravenous contrast. RADIATION DOSE REDUCTION: This exam was performed according to the departmental dose-optimization program which includes automated exposure control, adjustment of the mA and/or kV according to patient size and/or use of iterative reconstruction technique. COMPARISON:  None Available. FINDINGS: Brain: Normal anatomic configuration. Parenchymal volume loss is commensurate with the patient's age. Mild periventricular white matter changes are present likely reflecting the sequela of small vessel ischemia. No abnormal intra or extra-axial mass lesion or fluid collection. No abnormal mass effect or midline shift. No evidence of acute intracranial hemorrhage or infarct. Ventricular size is normal. Cerebellum unremarkable. Vascular: No asymmetric hyperdense vasculature at the skull base. Skull: Intact Sinuses/Orbits: Paranasal sinuses are clear. Orbits are unremarkable. Other: Mastoid air cells and middle ear cavities are clear. IMPRESSION: No acute intracranial hemorrhage or infarct. Mild senescent change.  Electronically Signed   By: Fidela Salisbury M.D.   On: 09/01/2022 22:51   DG Chest Portable 1 View  Result Date: 09/01/2022 CLINICAL DATA:  Intubation EXAM: PORTABLE CHEST 1 VIEW COMPARISON:  Chest x-ray 09/01/2022 FINDINGS: Endotracheal tube tip is 4 cm above the carina. Cardiac silhouette is borderline enlarged, unchanged. No focal lung infiltrate, pleural effusion or pneumothorax. No acute fractures. IMPRESSION: 1. Endotracheal tube tip is 4 cm above the carina. 2. The lungs are clear. Electronically Signed   By: Ronney Asters M.D.   On: 09/01/2022 22:29   DG CHEST  PORT 1 VIEW  Result Date: 09/01/2022 CLINICAL DATA:  Dyspnea EXAM: PORTABLE CHEST 1 VIEW COMPARISON:  Chest x-ray and CT 09/01/2022 FINDINGS: Low lung volumes. Enlarged cardiomediastinal silhouette augmented by rotation. Central bronchovascular crowding. No acute airspace disease, pleural effusion or pneumothorax IMPRESSION: 1. Hypoventilatory changes. 2. Cardiomegaly Electronically Signed   By: Donavan Foil M.D.   On: 09/01/2022 21:49     Assessment and Plan:   Atrial flutter - new onset - agree with 25 mg metoprolol BID - rate controlled, asymptomatic - will continue with rate control strategy for now  - he is hemodynamically stable, no indication for urgent cardioversion - plan to rate control and can perform outpatient DCCV after at least 3 weeks of anticoagulation, if he does not convert on his own - keep K close to 4.0 and Mg > 2.0 - TSH WNL - echo with grade 2 DD   Need for chronic anticoagulation He does not have a history of ischemic heart disease and does not require insulin. Grade II DD, but has not been treated for CHF.  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   Medication Recommendations:  Metoprolol 25 mg BID, Eliquis 5 mg BID Other recommendations (labs, testing, etc):  None Follow up as an outpatient:  Will schedule     Risk Assessment/Risk Scores:      For questions or updates, please contact [place] Please consult [email or web address] for contact info under    Signed, Ledora Bottcher, [place]  09/05/2022 2:32 PM  Patient seen and examined.  Agree with above documentation.  Mr. Alison Gibson is an 81 year old male with a history of CKD stage IIIa, hypertension we are consulted by Dr. Thompson Grayer for evaluation of atrial flutter.  He has no known cardiac history.  He did see Dr. Acie Fredrickson in 2017 for atypical chest pain and underwent Myoview stress test that was unremarkable.  He presented to ED with weakness and fatigue on 9/25.  He reported to have been having falls due to weakness.  Also reported diarrhea.  He was found to have hypotension and AKI (was taking multiple antihypertensives at that time).  Also diagnosed with rhabdomyolysis.  Subsequently in the ED he developed a fever to 35 F and became acutely altered.  He underwent an LP and EEG which were unremarkable.  He was intubated for acute hypoxic respiratory failure but extubated on 9/27.  AKI and rhabdo improved with IV fluids.  On 9/28, was noted on telemetry to have onset of atrial flutter.  EKG shows atrial flutter with variable conduction, rate 83.  Echocardiogram 09/02/2022 showed EF 14%, grade 2 diastolic dysfunction, normal RV function, no significant valvular disease, mild dilatation of aortic root measuring 43 mm.  Most recent labs showed hemoglobin 11.6, platelets  187, creatinine 0.8, AST 125, ALT 133.  On exam, patient is alert and oriented, regular rate, irregular rhythm, no murmurs, lungs CTAB, no LE edema or JVD.  For his atrial flutter, rates appear well controlled on metoprolol 25 mg twice daily, would continue.  CHA2DS2-VASc score 3 (hypertension, age x2).  Recommend starting Eliquis 5 mg twice daily; he reports recent falls related to his acute illness but prior to that states that he has not fallen in a long time.  Will schedule cardiology follow-up.  Donato Heinz, MD

## 2022-09-05 NOTE — Assessment & Plan Note (Signed)
-   On home amlodipine and metoprolol - Holding home chlorthalidone and losartan

## 2022-09-05 NOTE — Care Management Important Message (Signed)
Important Message  Patient Details  Name: [name] Gibson MRN: [id number] Date of Birth: 1941/01/12   Medicare Important Message Given:  Yes     Hannah Beat 09/05/2022, 3:45 PM

## 2022-09-05 NOTE — Assessment & Plan Note (Signed)
Noted as calicified on CT chest, patient denies h/o TB or other known exposures Consider further w/u outpatient for granulomatous disease, Quant Gold TB pending

## 2022-09-05 NOTE — Progress Notes (Signed)
ANTICOAGULATION CONSULT NOTE - Initial Consult  Pharmacy Consult for Apixaban Indication: atrial fibrillation  Allergies  Allergen Reactions   Penicillins     REACTION: unspecified Has patient had a PCN reaction causing immediate rash, facial/tongue/throat swelling, SOB or lightheadedness with hypotension: N Has patient had a PCN reaction causing severe rash involving mucus membranes or skin necrosis: N Has patient had a PCN reaction that required hospitalization: N Has patient had a PCN reaction occurring within the last 10 years: N If all of the above answers are "NO", then may proceed with Cephalosporin use.     Patient Measurements: Height: '5\' 4"'$  (162.6 cm) Weight: 81.4 kg (179 lb 7.3 oz) IBW/kg (Calculated) : 59.2  Vital Signs: Temp: 99.7 F (37.6 C) (09/29 1558) Temp Source: Oral (09/29 1558) BP: 121/60 (09/29 1558) Pulse Rate: 86 (09/29 1558)  Labs: Recent Labs    09/03/22 0516 09/04/22 0318 09/05/22 1001  HGB 11.3* 11.1* 11.6*  HCT 32.0* 30.4* 32.5*  PLT 103* 121* 187  CREATININE 1.09 0.96 0.81  CKTOTAL 1,841*  --   --     Estimated Creatinine Clearance: 66.6 mL/min (by C-G formula based on SCr of 0.81 mg/dL).   Medical History: Past Medical History:  Diagnosis Date   Acute pain of right knee 09/28/2018   ANGIOEDEMA 08/25/2007   Qualifier: Diagnosis of  By: Henry Russel PAIN, RIGHT 11/16/2008   Qualifier: Diagnosis of  By: Jerold Coombe     Atherosclerosis of aorta ([place]) 09/01/2022   Cancer ([place])    prostate   COLONIC POLYPS 10/22/2007   Qualifier: Diagnosis of  By: Jerold Coombe     DIVERTICULOSIS OF COLON 10/22/2007   Qualifier: Diagnosis of  By: Jerold Coombe     Generalized weakness 09/01/2022   GLAUCOMA 03/23/2007   Qualifier: Diagnosis of  By: Etter Sjogren DO, Yvonne     GLUCOSE INTOLERANCE, HX OF 03/23/2007   Qualifier: Diagnosis of  By: Jerold Coombe     HYPERLIPIDEMIA 03/23/2007   Qualifier: Diagnosis of  By: Jerold Coombe     HYPERTENSION 09/08/2007   Qualifier: Diagnosis of  By: Jerold Coombe     HYPERTHYROIDISM 03/23/2007   Qualifier: Diagnosis of  By: Jerold Coombe  - no med   Obesity (BMI 30-39.9) 12/07/2013   PAD (peripheral artery disease) ([place]) 09/01/2022   PROSTATE CANCER, HX OF 10/02/2010   Qualifier: Diagnosis of  By: Jerold Coombe     Right shoulder pain 02/08/2015   Sebaceous cyst 12/13/2008   Qualifier: Diagnosis of  By: Jerold Coombe      Medications:  Scheduled:   amLODipine  10 mg Oral Daily   enoxaparin (LOVENOX) injection  40 mg Subcutaneous Q24H   feeding supplement  237 mL Oral TID BM   latanoprost  1 drop Both Eyes QHS   metoprolol tartrate  25 mg Oral BID   sodium chloride flush  3 mL Intravenous Q12H    Assessment: 81 y.o. M presented with AKI and aflutter. CHA2DS2-VASc score 3 (hypertension, age x2).  Cardiology recommend starting Eliquis 5 mg twice daily. Noted pt reports recent falls related to his acute illness but prior to that states that he has not fallen in a long time. CBC stable. SCr back down to 0.81.  Goal of Therapy:  Prevention of CVA Monitor platelets by anticoagulation protocol: Yes   Plan:  D/c Lovenox Apixaban '5mg'$  po BID Will f/u CBC If  d/c on the weekend, will need to consult case management to check on apixaban pricing  Sherlon Handing, PharmD, BCPS Please see amion for complete clinical pharmacist phone list 09/05/2022,4:55 PM

## 2022-09-05 NOTE — Telephone Encounter (Signed)
FYI-patient called to advise he's been the hospital since Monday and he is sorry he canceled his appointment. Advised patient to call us when he is discharged so we can schedule follow up. Patient stated understanding.

## 2022-09-05 NOTE — Progress Notes (Signed)
Physical Therapy Treatment Patient Details Name: [name] Gibson MRN: [id number] DOB: [birth year] Today's Date: 09/05/2022   History of Present Illness 81 yo admitted 9/25 with rt knee pain and AKI. Abrupt change in ED with respiratory distress in ED and intubated 9/25-9/28. PMHx: prostate CA, HTn, HLD, hyperthyroidism    PT Comments    Patient is agreeable to PT and is making progress towards meeting goals. The patient continues to require minimal assistance with mobility efforts. He increased gait distance today with rolling walker but is limited by generalized weakness and fatigue with activity. Recommend to continue PT to maximize independence and decrease caregiver burden. Anticipate patient can return home with HHPT and assistance from family.    Recommendations for follow up therapy are one component of a multi-disciplinary discharge planning process, led by the attending physician.  Recommendations may be updated based on patient status, additional functional criteria and insurance authorization.  Follow Up Recommendations  Home health PT     Assistance Recommended at Discharge Intermittent Supervision/Assistance  Patient can return home with the following A little help with walking and/or transfers;A little help with bathing/dressing/bathroom;Assistance with cooking/housework;Assist for transportation;Help with stairs or ramp for entrance   Equipment Recommendations  Rolling walker (2 wheels)    Recommendations for Other Services       Precautions / Restrictions Precautions Precautions: Fall Restrictions Weight Bearing Restrictions: No     Mobility  Bed Mobility Overal bed mobility: Needs Assistance Bed Mobility: Supine to Sit     Supine to sit: Min assist, HOB elevated     General bed mobility comments: increased time and effort. cues for technique    Transfers Overall transfer level: Needs assistance Equipment used: Rolling walker (2 wheels) Transfers: Sit  to/from Stand Sit to Stand: Min assist           General transfer comment: several bouts of standing performed. lifting assistance required. posterior bias initially that improved with repeated standing trials    Ambulation/Gait Ambulation/Gait assistance: Min assist, Min guard Gait Distance (Feet): 28 Feet Assistive device: Rolling walker (2 wheels) Gait Pattern/deviations: Step-through pattern, Decreased stride length, Narrow base of support Gait velocity: decreased     General Gait Details: patient was unsteady initially, requiring Min A for steadying. balance improved with increased gait distance and less assistance required. activity tolerance limited by fatigue   Stairs             Wheelchair Mobility    Modified Rankin (Stroke Patients Only)       Balance Overall balance assessment: Needs assistance Sitting-balance support: No upper extremity supported, Feet supported, Feet unsupported Sitting balance-Leahy Scale: Good     Standing balance support: Bilateral upper extremity supported, Reliant on assistive device for balance Standing balance-Leahy Scale: Poor Standing balance comment: poor initially due to posterior bais, progressing to fair with increased standing time                            Cognition Arousal/Alertness: Awake/alert Behavior During Therapy: WFL for tasks assessed/performed Overall Cognitive Status: Impaired/Different from baseline Area of Impairment: Orientation                 Orientation Level: Disoriented to, Time, Situation       Safety/Judgement: Decreased awareness of safety, Decreased awareness of deficits   Problem Solving: Requires verbal cues General Comments: patient seems to have trouble remembering recent events. tangential at times, needs cues for attention to task  Exercises      General Comments        Pertinent Vitals/Pain Pain Assessment Pain Assessment: No/denies pain     Home Living                          Prior Function            PT Goals (current goals can now be found in the care plan section) Acute Rehab PT Goals Patient Stated Goal: get home soon PT Goal Formulation: With patient Time For Goal Achievement: 09/18/22 Potential to Achieve Goals: Good Progress towards PT goals: Progressing toward goals    Frequency    Min 3X/week      PT Plan Current plan remains appropriate    Co-evaluation              AM-PAC PT "6 Clicks" Mobility   Outcome Measure  Help needed turning from your back to your side while in a flat bed without using bedrails?: A Little Help needed moving from lying on your back to sitting on the side of a flat bed without using bedrails?: A Little Help needed moving to and from a bed to a chair (including a wheelchair)?: A Little Help needed standing up from a chair using your arms (e.g., wheelchair or bedside chair)?: A Little Help needed to walk in hospital room?: A Lot Help needed climbing 3-5 steps with a railing? : A Little 6 Click Score: 17    End of Session Equipment Utilized During Treatment: Gait belt Activity Tolerance: Patient tolerated treatment well Patient left: in chair;with call bell/phone within reach;with chair alarm set Nurse Communication: Mobility status PT Visit Diagnosis: Unsteadiness on feet (R26.81);Muscle weakness (generalized) (M62.81)     Time: 2836-6294 PT Time Calculation (min) (ACUTE ONLY): 29 min  Charges:  $Therapeutic Activity: 23-37 mins                     Minna Merritts, PT, MPT    Percell Locus 09/05/2022, 12:42 PM

## 2022-09-05 NOTE — Assessment & Plan Note (Addendum)
Noted on admission to ICU, two low grade fevers yesterday, has had negative blood and urine cultures, chest X-ray, and CT chest abd and pelvis and cefepime stopped If fevers again, would re-culture and repeat CXR Did have bilateral perinephric fat stranding noted on CT abd pelvis but urine culture negative, consider repeat CTAB/pelvis with contrast now that AKI has resolved

## 2022-09-05 NOTE — Assessment & Plan Note (Signed)
Resolved, s/p extubation 9/27, most recent CXR to without evidence of effusion or infiltrate

## 2022-09-05 NOTE — Progress Notes (Addendum)
FMTS Attending Daily Note: Yehuda Savannah, MD  Team Pager [phone] Pager [phone] I have seen and examined this patient, reviewed their chart. I have discussed this patient with the resident. I agree with the resident's findings, assessment and care plan.  Edits made in note below. Continue to monitor for fever although no source found. Appreciate cardiology recs. AKI resolved. No longer has knee pain and no signs of effusion.        Daily Progress Note Intern Pager: [phone]  Patient name: [name] Gibson Medical record number: [id number] Date of birth: 1940/12/18 Age: 81 y.o. Gender: male  Primary Care Provider: Carollee Herter, Alferd Apa, DO Consultants: Cardiology Code Status: Full  Pt Overview and Major Events to Date:  9/25 admitted to FMTS, then transferred to ICU for aspiration event and intubated 9/27 Extubated. CXR w/ improved left lower lung aeration. 9/29 Transfer back to FMTS  Assessment and Plan: GP is a 81yo M w/ hx of glaucoma, HTN, HLD, hx of prostate cancer that was admitted for AKI and new A Flutter. Pt was in ICU for resp failure requiring intubation after an aspiration, now extubated and transferred back to FMTS.   * AKI (acute kidney injury) ([place]) Resolved maintaining PO intake and adequate UOP.  Likely 2/2 rhabdomyolysis - On home amlodipine and metoprolol - Holding home chlorthalidone and losartan - AM CMP   Atrial flutter (HCC) EKG showed A flutter, but rate controlled.  - Home metoprolol - Consulted Cardiology, appreciate recs - Continuous telemetry  Fever Noted on admission to ICU, two low grade fevers yesterday, has had negative blood and urine cultures, chest X-ray, and CT chest abd and pelvis and cefepime stopped If fevers again, would re-culture and repeat CXR Did have bilateral perinephric fat stranding noted on CT abd pelvis but urine culture negative, consider repeat CTAB/pelvis with contrast now that AKI has resolved  Hypokalemia Replete K  29mq - AM CMP  Impaired ambulation - PT/OT - Fall precautions   Transaminitis - f/u CMP - Consider further imaging and workup for liver mass  Liver mass, right lobe - Consider outpatient MRI   Hypermagnesemia - AM Mag check   Hilar lymphadenopathy Noted as calicified on CT chest, patient denies h/o TB or other known exposures Consider further w/u outpatient for granulomatous disease, Quant Gold TB pending  Acute respiratory failure ([place] Resolved, s/p extubation 9/27, most recent CXR to without evidence of effusion or infiltrate  Primary hypertension - On home amlodipine and metoprolol - Holding home chlorthalidone and losartan  Elevated creatine kinase-resolved as of 09/05/2022 Downtrending. No longer trending  Pain in right knee-resolved as of 09/05/2022 Currently nonpainful    FEN/GI: Regular PPx: Lovenox Dispo:SNF pending clinical improvement . Barriers include cardiology evaluation for new a flutter.   Subjective:  Pt denies any pain, chest pain, SOB, dizziness, or discomfort this morning.  Objective: Temp:  [98.7 F (37.1 C)-100.5 F (38.1 C)] 99.1 F (37.3 C) (09/29 0756) Pulse Rate:  [78-82] 78 (09/29 0756) Resp:  [17-31] 17 (09/29 0756) BP: (102-128)/(59-70) 127/68 (09/29 0756) SpO2:  [95 %-99 %] 99 % (09/29 0756) Weight:  [81.4 kg] 81.4 kg (09/29 0500) Physical Exam: General: Alert, comfortably laying in bed. NAD. Cardiovascular: Regular rate, irregular rhythm. 2+ radial pulses. Respiratory: CTAB. Normal WOB on RA. Abdomen: Soft, nontender, nondistended. Normal BS.  Laboratory: Most recent CBC Lab Results  Component Value Date   WBC 13.4 (H) 09/05/2022   HGB 11.6 (L) 09/05/2022   HCT 32.5 (L) 09/05/2022  MCV 92.6 09/05/2022   PLT 187 09/05/2022   Most recent BMP    Latest Ref Rng & Units 09/05/2022   10:01 AM  BMP  Glucose 70 - 99 mg/dL 128   BUN 8 - 23 mg/dL 21   Creatinine 0.61 - 1.24 mg/dL 0.81   Sodium 135 - 145 mmol/L 136    Potassium 3.5 - 5.1 mmol/L 3.4   Chloride 98 - 111 mmol/L 102   CO2 22 - 32 mmol/L 26   Calcium 8.9 - 10.3 mg/dL 8.7     Lenoria Chime, MD 09/05/2022, 2:06 PM  PGY-1, [place] Intern pager: [phone], text pages welcome Secure chat group [place]

## 2022-09-06 LAB — CBC
HCT: 32.1 % — ABNORMAL LOW (ref 39.0–52.0)
Hemoglobin: 11.8 g/dL — ABNORMAL LOW (ref 13.0–17.0)
MCH: 33.2 pg (ref 26.0–34.0)
MCHC: 36.8 g/dL — ABNORMAL HIGH (ref 30.0–36.0)
MCV: 90.4 fL (ref 80.0–100.0)
Platelets: 212 10*3/uL (ref 150–400)
RBC: 3.55 MIL/uL — ABNORMAL LOW (ref 4.22–5.81)
RDW: 14.6 % (ref 11.5–15.5)
WBC: 13.2 10*3/uL — ABNORMAL HIGH (ref 4.0–10.5)
nRBC: 0 % (ref 0.0–0.2)

## 2022-09-06 LAB — COMPREHENSIVE METABOLIC PANEL
ALT: 112 U/L — ABNORMAL HIGH (ref 0–44)
AST: 89 U/L — ABNORMAL HIGH (ref 15–41)
Albumin: 2.1 g/dL — ABNORMAL LOW (ref 3.5–5.0)
Alkaline Phosphatase: 142 U/L — ABNORMAL HIGH (ref 38–126)
Anion gap: 11 (ref 5–15)
BUN: 21 mg/dL (ref 8–23)
CO2: 28 mmol/L (ref 22–32)
Calcium: 9.4 mg/dL (ref 8.9–10.3)
Chloride: 100 mmol/L (ref 98–111)
Creatinine, Ser: 0.91 mg/dL (ref 0.61–1.24)
GFR, Estimated: >60 mL/min (ref >60)
Glucose, Bld: 162 mg/dL — ABNORMAL HIGH (ref 70–99)
Potassium: 3.6 mmol/L (ref 3.5–5.1)
Sodium: 139 mmol/L (ref 135–145)
Total Bilirubin: 1 mg/dL (ref 0.3–1.2)
Total Protein: 5.9 g/dL — ABNORMAL LOW (ref 6.5–8.1)

## 2022-09-06 LAB — GLUCOSE, CAPILLARY
Glucose-Capillary: 111 mg/dL — ABNORMAL HIGH (ref 70–99)
Glucose-Capillary: 139 mg/dL — ABNORMAL HIGH (ref 70–99)

## 2022-09-06 LAB — MAGNESIUM: Magnesium: 2.1 mg/dL (ref 1.7–2.4)

## 2022-09-06 MED ORDER — METOPROLOL TARTRATE 25 MG PO TABS: 25.0000 mg | ORAL_TABLET | Freq: Two times a day (BID) | ORAL | 1 refills | Status: DC

## 2022-09-06 MED ORDER — APIXABAN 5 MG PO TABS: 5.0000 mg | ORAL_TABLET | Freq: Two times a day (BID) | ORAL | 1 refills | Status: AC

## 2022-09-06 NOTE — TOC Transition Note (Addendum)
Transition of Care [facility]) - CM/SW Discharge Note   Patient Details  Name: Toy Eisemann MRN: [id number] Date of Birth: 1941/07/11  Transition of Care [facility]) CM/SW Contact:  L[address]ta [address], [place] Phone Number: 09/06/2022, 3:23 PM   Clinical Narrative:    UPDATE: Per MD pt wants to leave tomorrow. Taxi cancelled. Voucher given to Network engineer.  CSW provided a taxi voucher for pt-requested taxi stop by [employer] to pick up his medicine, [facility] agrees. CSW called the unit to advise taxi will be here at 4:00 pm at the [facility] entrance to pick up pt.    Final next level of care: [place] Barriers to Discharge: Continued Medical Work up   Patient Goals and CMS Choice Patient states their goals for this hospitalization and ongoing recovery are:: To return home CMS Medicare.gov Compare Post Acute Care list provided to:: Patient Choice offered to / list presented to : Patient  Discharge Placement                       Discharge Plan and Services   Discharge Planning Services: CM Consult Post Acute Care Choice: Home Health          DME Arranged: Bedside commode, Walker rolling DME Agency: [employer] Date DME Agency Contacted: 09/04/22 Time DME Agency Contacted: 1410 Representative spoke with at DME Agency: Jodell Cipro HH Arranged: PT, OT          Social Determinants of Health (SDOH) Interventions     Readmission Risk Interventions     No data to display

## 2022-09-06 NOTE — Progress Notes (Signed)
Occupational Therapy Treatment Patient Details Name: [name] Gibson MRN: [id number] DOB: 12/26/1940 Today's Date: 09/06/2022   History of present illness 81 yo admitted 9/25 with rt knee pain and AKI. Abrupt change in ED with respiratory distress in ED and intubated 9/25-9/28. PMHx: prostate CA, HTn, HLD, hyperthyroidism   OT comments  Pt. Seen for skilled OT treatment session.  Able to complete bed mobility with min a.  Able to complete in room adl task of gathering items and putting away.  Focus on rw management and safety with higher level adls.  Pt. Required min a with mod cues for task initiation and completion.  Reports he will have strong family support at home.  Cont. With adl progression next session.     Recommendations for follow up therapy are one component of a multi-disciplinary discharge planning process, led by the attending physician.  Recommendations may be updated based on patient status, additional functional criteria and insurance authorization.    Follow Up Recommendations  Home health OT    Assistance Recommended at Discharge Frequent or constant Supervision/Assistance  Patient can return home with the following  A little help with walking and/or transfers;A little help with bathing/dressing/bathroom;Assistance with cooking/housework;Assist for transportation;Help with stairs or ramp for entrance   Equipment Recommendations  BSC/3in1    Recommendations for Other Services      Precautions / Restrictions Precautions Precautions: Fall Precaution Comments: watch HR       Mobility Bed Mobility Overal bed mobility: Needs Assistance Bed Mobility: Supine to Sit, Sit to Supine     Supine to sit: Min assist, HOB elevated Sit to supine: Min assist   General bed mobility comments: increased time and effort. cues for technique    Transfers Overall transfer level: Needs assistance Equipment used: Rolling walker (2 wheels) Transfers: Sit to/from Stand, Bed to  chair/wheelchair/BSC Sit to Stand: Min assist           General transfer comment: cues for RW management during gathering tasks and putting items in cabinets     Balance                                           ADL either performed or assessed with clinical judgement   ADL Overall ADL's : Needs assistance/impaired                                     Functional mobility during ADLs: Minimal assistance;Rolling walker (2 wheels) General ADL Comments: in room with item gathering/ putting away min a with mod cues for task completion and to stay on desired task    Extremity/Trunk Assessment              Vision       Perception     Praxis      Cognition Arousal/Alertness: Awake/alert Behavior During Therapy: WFL for tasks assessed/performed Overall Cognitive Status: Impaired/Different from baseline Area of Impairment: Orientation                 Orientation Level: Disoriented to, Time, Situation       Safety/Judgement: Decreased awareness of safety, Decreased awareness of deficits   Problem Solving: Requires verbal cues General Comments: patient seems to have trouble remembering recent events. tangential at times, needs cues for attention to task  Exercises      Shoulder Instructions       General Comments  Mentioned a niece would be available to help, got tearful when speaking about her. Was emotionally moved by the reported outpour of love and support he has received from family.      Pertinent Vitals/ Pain       Pain Assessment Pain Assessment: No/denies pain  Home Living                                          Prior Functioning/Environment              Frequency  Min 2X/week        Progress Toward Goals  OT Goals(current goals can now be found in the care plan section)  Progress towards OT goals: Progressing toward goals     Plan      Co-evaluation                  AM-PAC OT "6 Clicks" Daily Activity     Outcome Measure   Help from another person eating meals?: None Help from another person taking care of personal grooming?: A Little Help from another person toileting, which includes using toliet, bedpan, or urinal?: A Little Help from another person bathing (including washing, rinsing, drying)?: A Little Help from another person to put on and taking off regular upper body clothing?: A Little Help from another person to put on and taking off regular lower body clothing?: A Little 6 Click Score: 19    End of Session Equipment Utilized During Treatment: Gait belt;Rolling walker (2 wheels)  OT Visit Diagnosis: Unsteadiness on feet (R26.81);Other abnormalities of gait and mobility (R26.89);Muscle weakness (generalized) (M62.81);History of falling (Z91.81);Cognitive communication deficit (R41.841)   Activity Tolerance Patient tolerated treatment well   Patient Left in bed;with call bell/phone within reach;with bed alarm set   Nurse Communication Other (comment) (alerted CNA male purewik needed to be re attached)        Time: 1638-4536 OT Time Calculation (min): 14 min  Charges: OT General Charges $OT Visit: 1 Visit OT Treatments $Self Care/Home Management : 8-22 mins  Sonia Baller, COTA/L Acute Rehabilitation [phone]   Clearnce Sorrel Lorraine-COTA/L 09/06/2022, 12:31 PM

## 2022-09-06 NOTE — Discharge Instructions (Addendum)
Dear [name] Gibson,   Thank you so much for allowing [place] to be part of your care!  You were admitted to [facility] for right knee pain and kidney injury.   You had to spend some time in the ICU and we treated you for pneumonia.   Also, we had you see cardiology because of an irregular heart rhythm called Atrial Flutter. You will follow-up with the Cardiologists 10/01/22. They may do a procedure called Cardioversion to fix this irregular heart rhythm. There is also a chance that the rhythm goes back to normal on its own.  When you go home, please take Metoprolol twice daily and Eliquis '5mg'$  twice daily, these are very important to take in addition to your other medications.    POST-HOSPITAL & CARE INSTRUCTIONS Please follow up with your cardiologist and PCP Please let PCP/Specialists know of any changes that were made.  Please see medications section of this packet for any medication changes.   DOCTOR'S APPOINTMENT & FOLLOW UP CARE INSTRUCTIONS  Future Appointments  Date Time Provider [place]  10/01/2022  3:10 PM [facility], Tami Lin, PA [facility] None    RETURN PRECAUTIONS: Shortness of breath chest pain swelling in the lower extremities Confusion  Fever, chills   Take care and be well!  [facility]  [place], [place] [id number] [phone]   Information on my medicine - ELIQUIS (apixaban)  Why was Eliquis prescribed for you? Eliquis was prescribed for you to reduce the risk of a blood clot forming that can cause a stroke if you have a medical condition called atrial fibrillation (a type of irregular heartbeat).  What do You need to know about Eliquis ? Take your Eliquis TWICE DAILY - one tablet in the morning and one tablet in the evening with or without food. If you have difficulty swallowing the tablet whole please discuss with your pharmacist how to take the medication  safely.  Take Eliquis exactly as prescribed by your doctor and DO NOT stop taking Eliquis without talking to the doctor who prescribed the medication.  Stopping may increase your risk of developing a stroke.  Refill your prescription before you run out.  After discharge, you should have regular check-up appointments with your healthcare provider that is prescribing your Eliquis.  In the future your dose may need to be changed if your kidney function or weight changes by a significant amount or as you get older.  What do you do if you miss a dose? If you miss a dose, take it as soon as you remember on the same day and resume taking twice daily.  Do not take more than one dose of ELIQUIS at the same time to make up a missed dose.  Important Safety Information A possible side effect of Eliquis is bleeding. You should call your healthcare provider right away if you experience any of the following: Bleeding from an injury or your nose that does not stop. Unusual colored urine (red or dark brown) or unusual colored stools (red or black). Unusual bruising for unknown reasons. A serious fall or if you hit your head (even if there is no bleeding).  Some medicines may interact with Eliquis and might increase your risk of bleeding or clotting while on Eliquis. To help avoid this, consult your healthcare provider or pharmacist prior to using any new prescription or non-prescription medications, including herbals, vitamins, non-steroidal anti-inflammatory drugs (NSAIDs)  and supplements.  This website has more information on Eliquis (apixaban): http://www.eliquis.com/eliquis/home

## 2022-09-06 NOTE — Progress Notes (Signed)
Daily Progress Note Intern Pager: [phone]  Patient name: [name] Gibson Medical record number: [id number] Date of birth: 1941-10-01 Age: 81 y.o. Gender: male  Primary Care Provider: Carollee Herter, Alferd Apa, DO Consultants: Cardiology Code Status: Full  Pt Overview and Major Events to Date:  9/25 admitted to FMTS, then transferred to ICU for aspiration event and intubated 9/27 Extubated. CXR w/ improved left lower lung aeration. 9/29 Transfer back to FMTS  Assessment and Plan: GP is a 81yo M w/ hx of glaucoma, HTN, HLD, hx of prostate cancer that was admitted for AKI and new A Flutter. Pt was in ICU for resp failure requiring intubation after an aspiration, now extubated and transferred back to FMTS. Tentatively planning to discharge tomorrow with home health and follow-up with cardiology in 4 wks.   * AKI (acute kidney injury) (HCC)-resolved as of 09/06/2022 Resolved maintaining PO intake and adequate UOP.  Likely 2/2 rhabdomyolysis - On home amlodipine and metoprolol - Holding home chlorthalidone and losartan - AM CMP   Atrial flutter (HCC) EKG showed A flutter, but rate controlled.  - Cont metoprolol - Start Eliquis BID for 3 wks for potential cardioversion w/ Cardiology - Consulted Cardiology, appreciate recs - Continuous telemetry  Fever-resolved as of 09/06/2022 Afeb >24hrs  Impaired ambulation - PT/OT - Fall precautions - Has walker with him in hospital  Transaminitis - f/u CMP - Consider further imaging and workup for liver mass  Liver mass, right lobe - Consider outpatient MRI   Hypermagnesemia-resolved as of 09/06/2022 - AM Mag check   Hilar lymphadenopathy Noted as calicified on CT chest, patient denies h/o TB or other known exposures Consider further w/u outpatient for granulomatous disease, Quant Gold TB pending  Acute respiratory failure ([place]) Resolved, s/p extubation 9/27, most recent CXR to without evidence of effusion or  infiltrate  Essential hypertension - On home amlodipine and metoprolol - Holding home chlorthalidone and losartan  Elevated creatine kinase-resolved as of 09/05/2022 Downtrending. No longer trending  Pain in right knee-resolved as of 09/05/2022 Currently nonpainful   FEN/GI: Regular PPx: Eliquis Dispo:Home with home health tomorrow. Barriers include monitoring of atrial flutter.   Subjective:  NAEO. Pt family will be available to pick him up tomorrow ~2pm.  Objective: Temp:  [98.2 F (36.8 C)-98.9 F (37.2 C)] 98.2 F (36.8 C) (09/30 0752) Pulse Rate:  [81-84] 81 (09/30 0752) Resp:  [16-18] 16 (09/30 0752) BP: (122-135)/(69-73) 135/73 (09/30 0752) SpO2:  [97 %-99 %] 99 % (09/30 0752) Weight:  [78.7 kg] 78.7 kg (09/30 0500) Physical Exam: General: Well-appearing, laying in bed comfortably. NAD. Cardiovascular: RRR. 2+ radial pulses Respiratory: CTAB. Normal WOB on RA. Abdomen: Soft, nontender, nondistended. Normal BS. Extremities: SCDs in place  Laboratory: Most recent CBC Lab Results  Component Value Date   WBC 13.2 (H) 09/06/2022   HGB 11.8 (L) 09/06/2022   HCT 32.1 (L) 09/06/2022   MCV 90.4 09/06/2022   PLT 212 09/06/2022   Most recent BMP    Latest Ref Rng & Units 09/06/2022    2:37 AM  BMP  Glucose 70 - 99 mg/dL 162   BUN 8 - 23 mg/dL 21   Creatinine 0.61 - 1.24 mg/dL 0.91   Sodium 135 - 145 mmol/L 139   Potassium 3.5 - 5.1 mmol/L 3.6   Chloride 98 - 111 mmol/L 100   CO2 22 - 32 mmol/L 28   Calcium 8.9 - 10.3 mg/dL 9.4    Arlyce Dice, MD 09/06/2022, 4:11 PM  PGY-1, [place] Intern pager: [phone], text pages welcome Secure chat group [place]

## 2022-09-07 LAB — CULTURE, BLOOD (ROUTINE X 2)
Culture: NO GROWTH
Special Requests: ADEQUATE

## 2022-09-07 NOTE — Progress Notes (Signed)
Patient discharged home with family. Walker, raised toilet and other belongings sent with patient and family.

## 2022-09-07 NOTE — TOC Transition Note (Signed)
Transition of Care [facility]) - CM/SW Discharge Note   Patient Details  Name: [name] Gibson MRN: [id number] Date of Birth: 1941/07/07  [facility] CM/SW Contact:  Bartholomew Crews, RN Phone Number: [phone] 09/07/2022, 11:38 AM   Clinical Narrative:     Patient to transition home today. Advised by nursing that sister will pick him. Communicated with liaison at [facility] to advise of transition home - previous RNCM had arranged for [facility] PT/OT. Communicated with liaison at [place] - DME has not been delivered but will be brought to bedside prior to dc home. No further TOC needs needed at this time.   Final next level of care: [place] Barriers to Discharge: No Barriers Identified   Patient Goals and CMS Choice Patient states their goals for this hospitalization and ongoing recovery are:: To return home CMS Medicare.gov Compare Post Acute Care list provided to:: Patient Choice offered to / list presented to : Patient  Discharge Placement                       Discharge Plan and Services   Discharge Planning Services: CM Consult Post Acute Care Choice: Home Health          DME Arranged: 3-N-1, Walker rolling DME Agency: [employer] Date DME Agency Contacted: 09/07/22 Time DME Agency Contacted: [phone] Representative spoke with at DME Agency: [place]: PT, OT [place] Agency: [place] Date [place]: 09/07/22 Time [place]: 1134 Representative spoke with at [place]: [place] Determinants of Health ([place]) Interventions     Readmission Risk Interventions     No data to display

## 2022-09-07 NOTE — Discharge Summary (Signed)
[facility] Discharge Summary  Patient name: [place] record number: [id number] Date of birth: Sep 21, 1941 Age: 81 y.o. Gender: male Date of Admission: 09/01/2022  Date of Discharge: 09/07/2022 Admitting Physician: Lissa Morales, MD Primary Care Provider: Carollee Herter, Alferd Apa, DO Consultants: Cardiology, Neurology, CCM  Indication for Hospitalization: Acute kidney injury  Brief Hospital Course:  [name] Gibson is an 81yo M w/ PMHx of CKD 3a, HTN, HLD, hx of prostate cancer, and glaucoma who was admitted for AKI and rhabdomyolysis. He subsequently developed respiratory distress requiring intubation after a presumed aspiration event. Hospital course is outlined below:  AKI  Rhabdomyolysis Found to have AKI and elevated CK on admission. Pt was given IVF bolus and home BP meds were initially held. Cr downtrended appropriately. At discharge, pt was maintaining adequate PO fluid intake, adequate UOP, and had Cr wnl.   Acute Hypoxemic Respiratory Failure  c/f Aspiration PNA vs Pneumonitis  Initial CXR in ED was unremarkable. Shortly after admission, pt was eating and then suddenly found to be tachypneic, hypoxic to 60% on 3L, and altered. Pt was intubated and transferred to ICU. Subsequent CXR showed L lung opacification. Pt also developed fever (tmax 103.67F) lasting 4 days. Pt received 2 days of cefepime for c/f potential aspiration pneumonia. At time of discharge, pt was satting well on RA and remained afebrile >48hrs.  Atrial Flutter Was noted to have new onset atrial flutter on EKG on admission, but HR was normal. Cardiology evaluated and pt was started on Metoprolol tartrate '25mg'$  BID (in place of home toprol xl '200mg'$  daily). He was also started on Eliquis for anticoagulation for 3 weeks and then potential DCCV with Cardiology (f/u scheduled). Pt remained rate controlled at discharge.  HTN Was normotensive on amlodipine and metoprolol (which were  home meds). His home chlorthalidone and losartan were discontinued.  New R Lobe Liver Mass  Transaminitis On admission, transaminases elevated. CT abd incidentally found new R lobe liver mass. Transaminases downtrending at time of discharge. Consider outpt MRI for further workup of liver mass.   Right Knee Pain Initially presented to ED for R knee pain and weakness after a mechanical fall a few weeks prior. Pt received percocet in ED for R knee pain. Once FMTS team saw pt for admission, pain was resolved and exam was benign. XR of knee did not show signs of fracture.   Issues for Follow Up:  1) New R lobe liver mass incidentally noted on CT. Consider outpatient MRI.  2) Will be on Eliquis for at least 3 wks prior to [place] with Cardiology.  3) Home losartan and chlorthalidone were discontinued at discharge. Reassess BP control.   Discharge Diagnoses/Problem List:  Active Problems:   Atrial flutter (HCC)   Impaired ambulation   Transaminitis   Liver mass, right lobe   Essential hypertension   Atherosclerosis of aorta (HCC)   Generalized weakness   Dyspnea    Bilateral perinephric fat stranding CT   Encephalopathy acute   Acute respiratory failure (HCC)   Atelectasis of left lung   Malnutrition of moderate degree   Hilar lymphadenopathy   Chronic diastolic heart failure (HCC)   Disposition: Home with home health services  Discharge Condition: Stable  Discharge Exam:  GEN: Alert, NAD CV: irregular rhythm, normal rate, normal S1/S2 Respiratory: Normal effort, lungs CTAB GI: Abdomen soft, nontender, nondistended EXT: No peripheral edema Neuro: A&O x3  Significant Procedures:  9/25: bedside intubation  Significant Labs and Imaging:  Recent Labs  Lab 09/05/22 1001 09/06/22 0237  WBC 13.4* 13.2*  HGB 11.6* 11.8*  HCT 32.5* 32.1*  PLT 187 212   Recent Labs  Lab 09/05/22 1001 09/06/22 0237  NA 136 139  K 3.4* 3.6  CL 102 100  CO2 26 28  GLUCOSE 128* 162*  BUN 21  21  CREATININE 0.81 0.91  CALCIUM 8.7* 9.4  MG  --  2.1  ALKPHOS 153* 142*  AST 125* 89*  ALT 130* 112*  ALBUMIN 2.2* 2.1*    Results/Tests Pending at Time of Discharge: None  Discharge Medications:  Allergies as of 09/07/2022       Reactions   Penicillins    REACTION: unspecified Has patient had a PCN reaction causing immediate rash, facial/tongue/throat swelling, SOB or lightheadedness with hypotension: N Has patient had a PCN reaction causing severe rash involving mucus membranes or skin necrosis: N Has patient had a PCN reaction that required hospitalization: N Has patient had a PCN reaction occurring within the last 10 years: N If all of the above answers are "NO", then may proceed with Cephalosporin use.        Medication List     STOP taking these medications    chlorthalidone 25 MG tablet Commonly known as: HYGROTON   losartan 100 MG tablet Commonly known as: COZAAR   metoprolol 200 MG 24 hr tablet Commonly known as: Estate manager/land agent COVID-19 Vac Bivalent injection Generic drug: COVID-19 mRNA bivalent vaccine Therapist, music)   Pfizer-BioNT COVID-19 Vac-TriS Susp injection Generic drug: COVID-19 mRNA Vac-TriS Therapist, music)   Shingrix injection Generic drug: Zoster Vaccine Adjuvanted   tiZANidine 4 MG tablet Commonly known as: ZANAFLEX   traZODone 50 MG tablet Commonly known as: DESYREL       TAKE these medications    amLODipine 10 MG tablet Commonly known as: NORVASC TAKE 1 TABLET(10 MG) BY MOUTH DAILY What changed: See the new instructions.   apixaban 5 MG Tabs tablet Commonly known as: ELIQUIS Take 1 tablet (5 mg total) by mouth 2 (two) times daily.   aspirin EC 325 MG tablet Take 325 mg by mouth every other day.   atorvastatin 20 MG tablet Commonly known as: LIPITOR Take 1 tablet (20 mg total) by mouth daily.   Fish Oil 1360 MG Caps Take 1 capsule (1,360 mg total) by mouth daily.   fluticasone 50 MCG/ACT nasal spray Commonly known as:  FLONASE SHAKE LIQUID AND USE 2 SPRAYS IN EACH NOSTRIL DAILY AS NEEDED FOR ALLERGIES What changed: See the new instructions.   Lumigan 0.01 % Soln Generic drug: bimatoprost Place 1 drop into both eyes at bedtime.   metoprolol tartrate 25 MG tablet Commonly known as: LOPRESSOR Take 1 tablet (25 mg total) by mouth 2 (two) times daily.               Durable Medical Equipment  (From admission, onward)           Start     Ordered   09/04/22 1530  For home use only DME Walker rolling  Once       Question Answer Comment  Walker: With [place] Wheels   Patient needs a walker to treat with the following condition Gait instability      09/04/22 1529   09/04/22 1529  For home use only DME Bedside commode  Once       Question:  Patient needs a bedside commode to treat with the following condition  Answer:  Weakness  09/04/22 1529            Discharge Instructions: Please refer to Patient Instructions section of EMR for full details.  Patient was counseled important signs and symptoms that should prompt return to medical care, changes in medications, dietary instructions, activity restrictions, and follow up appointments.   Follow-Up Appointments:  Follow-up Information     [facility] Follow up.   Specialty: Home Health Services Why: Someone will call you to schedule first home visit. Contact information: [address] [place] [address] [phone]         [name], DO. Schedule an appointment as soon as possible for a visit.   Specialty: Family Medicine Contact information: [address] [place] [address] [phone]         Donato Heinz, MD .   Specialties: Cardiology, Radiology Contact information: [address] [place] [address] [phone]                 Alcus Dad, MD 09/07/2022, 7:00 AM PGY-3, [place]

## 2022-09-08 ENCOUNTER — Telehealth: Payer: Self-pay

## 2022-09-08 NOTE — Telephone Encounter (Signed)
Transition Care Management Unsuccessful Follow-up Telephone Call  Date of discharge and from where:  09/07/2022, [facility].   Attempts:  1st Attempt  Reason for unsuccessful TCM follow-up call:  Left voice message

## 2022-09-09 NOTE — Telephone Encounter (Signed)
Transition Care Management Unsuccessful Follow-up Telephone Call  Date of discharge and from where:  09/07/2022, [facility]  Attempts:  2nd Attempt  Reason for unsuccessful TCM follow-up call:  Left voice message

## 2022-09-10 LAB — QUANTIFERON-TB GOLD PLUS (RQFGPL)
QuantiFERON Mitogen Value: >10 IU/mL
QuantiFERON Nil Value: 0.02 IU/mL
QuantiFERON TB1 Ag Value: 0.02 IU/mL
QuantiFERON TB2 Ag Value: 0.02 IU/mL

## 2022-09-10 LAB — QUANTIFERON-TB GOLD PLUS: QuantiFERON-TB Gold Plus: NEGATIVE

## 2022-09-10 NOTE — Telephone Encounter (Signed)
Transition Care Management Unsuccessful Follow-up Telephone Call  Date of discharge and from where:  09/07/2022, [facility]  Attempts:  3rd Attempt  Reason for unsuccessful TCM follow-up call:  Left voice message  Letter mailed to patient.

## 2022-09-19 ENCOUNTER — Encounter: Payer: Self-pay | Admitting: Family Medicine

## 2022-09-19 ENCOUNTER — Ambulatory Visit (INDEPENDENT_AMBULATORY_CARE_PROVIDER_SITE_OTHER): Payer: Medicare Other | Admitting: Family Medicine

## 2022-09-19 ENCOUNTER — Ambulatory Visit (HOSPITAL_BASED_OUTPATIENT_CLINIC_OR_DEPARTMENT_OTHER)
Admission: RE | Admit: 2022-09-19 | Discharge: 2022-09-19 | Disposition: A | Discharge status: Home / Self Care | Payer: Medicare Other | Source: Ambulatory Visit | Attending: Family Medicine | Admitting: Family Medicine

## 2022-09-19 VITALS — BP 110/68 | HR 78 | Temp 98.5°F | Resp 16 | Ht 64.0 in

## 2022-09-19 DIAGNOSIS — W19XXXA Unspecified fall, initial encounter: Secondary | ICD-10-CM | POA: Diagnosis not present

## 2022-09-19 DIAGNOSIS — I898 Other specified noninfective disorders of lymphatic vessels and lymph nodes: Secondary | ICD-10-CM | POA: Diagnosis not present

## 2022-09-19 DIAGNOSIS — E785 Hyperlipidemia, unspecified: Secondary | ICD-10-CM | POA: Diagnosis not present

## 2022-09-19 DIAGNOSIS — I517 Cardiomegaly: Secondary | ICD-10-CM | POA: Diagnosis not present

## 2022-09-19 DIAGNOSIS — I5032 Chronic diastolic (congestive) heart failure: Secondary | ICD-10-CM

## 2022-09-19 DIAGNOSIS — Z23 Encounter for immunization: Secondary | ICD-10-CM

## 2022-09-19 DIAGNOSIS — J189 Pneumonia, unspecified organism: Secondary | ICD-10-CM

## 2022-09-19 DIAGNOSIS — I483 Typical atrial flutter: Secondary | ICD-10-CM | POA: Diagnosis not present

## 2022-09-19 DIAGNOSIS — E440 Moderate protein-calorie malnutrition: Secondary | ICD-10-CM

## 2022-09-19 DIAGNOSIS — R7303 Prediabetes: Secondary | ICD-10-CM

## 2022-09-19 DIAGNOSIS — I10 Essential (primary) hypertension: Secondary | ICD-10-CM | POA: Diagnosis not present

## 2022-09-19 DIAGNOSIS — R1900 Intra-abdominal and pelvic swelling, mass and lump, unspecified site: Secondary | ICD-10-CM | POA: Insufficient documentation

## 2022-09-19 NOTE — Assessment & Plan Note (Signed)
HH for pt

## 2022-09-19 NOTE — Assessment & Plan Note (Signed)
Well controlled, no changes to meds. Encouraged heart healthy diet such as the DASH diet and exercise as tolerated.  °

## 2022-09-19 NOTE — Assessment & Plan Note (Signed)
Encourage heart healthy diet such as MIND or DASH diet, increase exercise, avoid trans fats, simple carbohydrates and processed foods, consider a krill or fish or flaxseed oil cap daily.  °

## 2022-09-19 NOTE — Assessment & Plan Note (Signed)
MRI abd

## 2022-09-19 NOTE — Assessment & Plan Note (Signed)
Recheck xray

## 2022-09-19 NOTE — Assessment & Plan Note (Signed)
Encourage eating  Add ensure / boost

## 2022-09-19 NOTE — Progress Notes (Signed)
Subjective:   By signing my name below, I, Roma Schanz, attest that this documentation has been prepared under the direction and in the presence of Roma Schanz, 09/19/2022     Patient ID: [name] Gibson, male    DOB: Jun 15, 1941, 81 y.o.   MRN: [id number]  Chief Complaint  Patient presents with   Hospitalization Follow-up    HPI Patient is in today for an office visit.  Patient is complaining of soreness around his right side which is exasperated when coughing. He reports to have a productive cough. Pain is reproduced with palpation. He reports that he experiences shortness of breath, but denies burping and heartburn. Patient came in after an ER visit due to an accidental fall.  Appetite- Patient reports that he has a reduced appetite and is not eating as much as he did previously.    Immunizations- Patient is requesting to receive influenza vaccine this visit.  Blood pressure- Patients blood pressure looks normal this visit. BP Readings from Last 3 Encounters:  09/19/22 110/68  09/07/22 118/68  03/03/22 128/62   Pulse Readings from Last 3 Encounters:  09/19/22 78  09/07/22 80  03/03/22 (!) 56    Past Medical History:  Diagnosis Date   Acute pain of right knee 09/28/2018   ANGIOEDEMA 08/25/2007   Qualifier: Diagnosis of  By: Henry Russel PAIN, RIGHT 11/16/2008   Qualifier: Diagnosis of  By: Jerold Coombe     Atherosclerosis of aorta ([place]) 09/01/2022   Cancer ([place])    prostate   COLONIC POLYPS 10/22/2007   Qualifier: Diagnosis of  By: Jerold Coombe     DIVERTICULOSIS OF COLON 10/22/2007   Qualifier: Diagnosis of  By: Jerold Coombe     Generalized weakness 09/01/2022   GLAUCOMA 03/23/2007   Qualifier: Diagnosis of  By: Etter Sjogren DO, Brenna Friesenhahn     GLUCOSE INTOLERANCE, HX OF 03/23/2007   Qualifier: Diagnosis of  By: Jerold Coombe     HYPERLIPIDEMIA 03/23/2007   Qualifier: Diagnosis of  By: Jerold Coombe     HYPERTENSION 09/08/2007    Qualifier: Diagnosis of  By: Jerold Coombe     HYPERTHYROIDISM 03/23/2007   Qualifier: Diagnosis of  By: Jerold Coombe  - no med   Obesity (BMI 30-39.9) 12/07/2013   PAD (peripheral artery disease) ([place]) 09/01/2022   PROSTATE CANCER, HX OF 10/02/2010   Qualifier: Diagnosis of  By: Jerold Coombe     Right shoulder pain 02/08/2015   Sebaceous cyst 12/13/2008   Qualifier: Diagnosis of  By: Jerold Coombe      Past Surgical History:  Procedure Laterality Date   APPENDECTOMY     radioactive seed implation     prostate    Family History  Problem Relation Age of Onset   Brain cancer Brother    Diabetes Other    Hyperlipidemia Other    Hypertension Other    Coronary artery disease Other    Cancer Father        spine   Stomach cancer Sister    Kidney failure Brother    Diabetes Mellitus II Brother    Throat cancer Brother    Lung cancer Brother    Stroke Brother    Stroke Brother    Stroke Sister    Breast cancer Sister    Colon cancer Maternal Grandmother     Social History   Socioeconomic History   Marital status:  Divorced    Spouse name: Not on file   Number of children: Not on file   Years of education: Not on file   Highest education level: Not on file  Occupational History   Occupation: Diplomatic Services operational officer: [employer]  Tobacco Use   Smoking status: Never   Smokeless tobacco: Never  Substance and Sexual Activity   Alcohol use: Not Currently    Alcohol/week: 1.0 standard drink of alcohol    Types: 1 Shots of liquor per week   Drug use: No   Sexual activity: Yes    Partners: Female  Other Topics Concern   Not on file  Social History Narrative   Reg exercise--walking 2-3 x a week   Social Determinants of Radio broadcast assistant Strain: Not on file  Food Insecurity: Not on file  Transportation Needs: Not on file  Physical Activity: Not on file  Stress: Not on file  Social Connections: Not on file  Intimate Partner Violence: Not on file     Outpatient Medications Prior to Visit  Medication Sig Dispense Refill   amLODipine (NORVASC) 10 MG tablet TAKE 1 TABLET(10 MG) BY MOUTH DAILY (Patient taking differently: Take 10 mg by mouth daily.) 90 tablet 1   apixaban (ELIQUIS) 5 MG TABS tablet Take 1 tablet (5 mg total) by mouth 2 (two) times daily. 60 tablet 1   aspirin EC 325 MG tablet Take 325 mg by mouth every other day.     atorvastatin (LIPITOR) 20 MG tablet Take 1 tablet (20 mg total) by mouth daily. 90 tablet 1   bimatoprost (LUMIGAN) 0.01 % SOLN Place 1 drop into both eyes at bedtime.     fluticasone (FLONASE) 50 MCG/ACT nasal spray SHAKE LIQUID AND USE 2 SPRAYS IN EACH NOSTRIL DAILY AS NEEDED FOR ALLERGIES (Patient taking differently: Place 2 sprays into both nostrils daily as needed for allergies.) 16 g 5   metoprolol tartrate (LOPRESSOR) 25 MG tablet Take 1 tablet (25 mg total) by mouth 2 (two) times daily. 30 tablet 1   Omega-3 Fatty Acids (FISH OIL) 1360 MG CAPS Take 1 capsule (1,360 mg total) by mouth daily. 30 capsule    No facility-administered medications prior to visit.    Allergies  Allergen Reactions   Penicillins     REACTION: unspecified Has patient had a PCN reaction causing immediate rash, facial/tongue/throat swelling, SOB or lightheadedness with hypotension: N Has patient had a PCN reaction causing severe rash involving mucus membranes or skin necrosis: N Has patient had a PCN reaction that required hospitalization: N Has patient had a PCN reaction occurring within the last 10 years: N If all of the above answers are "NO", then may proceed with Cephalosporin use.     Review of Systems  Constitutional:  Negative for fever and malaise/fatigue.  HENT:  Negative for congestion.   Eyes:  Negative for blurred vision.  Respiratory:  Positive for cough, sputum production and shortness of breath.   Cardiovascular:  Negative for chest pain, palpitations and leg swelling.  Gastrointestinal:  Negative for  abdominal pain, blood in stool, heartburn and nausea.  Genitourinary:  Negative for dysuria and frequency.  Musculoskeletal:  Negative for falls.       (+) soreness on right side  Skin:  Negative for rash.  Neurological:  Positive for weakness. Negative for dizziness, loss of consciousness and headaches.  Endo/Heme/Allergies:  Negative for environmental allergies.  Psychiatric/Behavioral:  Negative for depression. The patient is not nervous/anxious.  Objective:    Physical Exam Vitals and nursing note reviewed.  Constitutional:      Gibson: He is not in acute distress.    Appearance: Normal appearance. He is not ill-appearing.  HENT:     Head: Normocephalic and atraumatic.     Right Ear: External ear normal.     Left Ear: External ear normal.  Eyes:     Extraocular Movements: Extraocular movements intact.     Pupils: Pupils are equal, round, and reactive to light.  Cardiovascular:     Rate and Rhythm: Normal rate and regular rhythm.     Heart sounds: Normal heart sounds. No murmur heard.    No gallop.  Pulmonary:     Effort: Pulmonary effort is normal. No respiratory distress.     Breath sounds: Normal breath sounds. No wheezing or rales.  Abdominal:     Palpations: Abdomen is soft.     Tenderness: There is abdominal tenderness in the right upper quadrant and epigastric area. There is no guarding or rebound.  Skin:    Gibson: Skin is warm and dry.  Neurological:     Mental Status: He is alert and oriented to person, place, and time.  Psychiatric:        Judgment: Judgment normal.     BP 110/68 (BP Location: Left Arm, Patient Position: Sitting, Cuff Size: Normal)   Pulse 78   Temp 98.5 F (36.9 C) (Oral)   Resp 16   Ht '5\' 4"'$  (1.626 m)   SpO2 94%   BMI 29.78 kg/m  Wt Readings from Last 3 Encounters:  09/06/22 173 lb 8 oz (78.7 kg)  03/03/22 182 lb 9.6 oz (82.8 kg)  08/29/21 180 lb (81.6 kg)    Diabetic Foot Exam - Simple   No data filed    Lab  Results  Component Value Date   WBC 13.2 (H) 09/06/2022   HGB 11.8 (L) 09/06/2022   HCT 32.1 (L) 09/06/2022   PLT 212 09/06/2022   GLUCOSE 162 (H) 09/06/2022   CHOL 160 03/03/2022   TRIG 55.0 03/03/2022   HDL 65.40 03/03/2022   LDLDIRECT 173.4 03/16/2013   LDLCALC 84 03/03/2022   ALT 112 (H) 09/06/2022   AST 89 (H) 09/06/2022   NA 139 09/06/2022   K 3.6 09/06/2022   CL 100 09/06/2022   CREATININE 0.91 09/06/2022   BUN 21 09/06/2022   CO2 28 09/06/2022   TSH 0.997 09/02/2022   PSA 0.00 (L) 03/03/2022   INR 1.2 09/01/2022   HGBA1C 6.1 (H) 09/02/2022   MICROALBUR <0.7 01/28/2021    Lab Results  Component Value Date   TSH 0.997 09/02/2022   Lab Results  Component Value Date   WBC 13.2 (H) 09/06/2022   HGB 11.8 (L) 09/06/2022   HCT 32.1 (L) 09/06/2022   MCV 90.4 09/06/2022   PLT 212 09/06/2022   Lab Results  Component Value Date   NA 139 09/06/2022   K 3.6 09/06/2022   CO2 28 09/06/2022   GLUCOSE 162 (H) 09/06/2022   BUN 21 09/06/2022   CREATININE 0.91 09/06/2022   BILITOT 1.0 09/06/2022   ALKPHOS 142 (H) 09/06/2022   AST 89 (H) 09/06/2022   ALT 112 (H) 09/06/2022   PROT 5.9 (L) 09/06/2022   ALBUMIN 2.1 (L) 09/06/2022   CALCIUM 9.4 09/06/2022   ANIONGAP 11 09/06/2022   GFR 56.81 (L) 03/03/2022   Lab Results  Component Value Date   CHOL 160 03/03/2022   Lab Results  Component Value Date   HDL 65.40 03/03/2022   Lab Results  Component Value Date   LDLCALC 84 03/03/2022   Lab Results  Component Value Date   TRIG 55.0 03/03/2022   Lab Results  Component Value Date   CHOLHDL 2 03/03/2022   Lab Results  Component Value Date   HGBA1C 6.1 (H) 09/02/2022       Assessment & Plan:   Problem List Items Addressed This Visit       Unprioritized   Atrial flutter ([place]) - Primary   Relevant Orders   Ambulatory referral to [place]   Primary hypertension    Well controlled, no changes to meds. Encouraged heart healthy diet such as the DASH diet  and exercise as tolerated.       Relevant Orders   Ambulatory referral to [place]   CBC with Differential/Platelet   Comprehensive metabolic panel   Hemoglobin A1c   Lipid panel   Prediabetes    Check labs      Relevant Orders   CBC with Differential/Platelet   Comprehensive metabolic panel   Hemoglobin A1c   Lipid panel   Pneumonia of left lower lobe due to infectious organism    Recheck xray      Relevant Orders   DG Chest 2 View (Completed)   Malnutrition of moderate degree    Encourage eating  Add ensure / boost       Intra-abdominal and pelvic swelling, mass and lump, unspecified site    MRI abd       Relevant Orders   MR Abdomen W Wo Contrast   Ambulatory referral to [place]   CBC with Differential/Platelet   Comprehensive metabolic panel   Hemoglobin A1c   Lipid panel   Hyperlipidemia    Encourage heart healthy diet such as MIND or DASH diet, increase exercise, avoid trans fats, simple carbohydrates and processed foods, consider a krill or fish or flaxseed oil cap daily.       Fall    [facility] for pt       Relevant Orders   Ambulatory referral to [place]   Chronic diastolic heart failure [facility])    F/u cardiology      Other Visit Diagnoses     Need for influenza vaccination       Relevant Orders   Flu Vaccine QUAD High Dose(Fluad) (Completed)       No orders of the defined types were placed in this encounter.   I, Roma Schanz, personally preformed the services described in this documentation.  All medical record entries made by the scribe were at my direction and in my presence.  I have reviewed the chart and discharge instructions (if applicable) and agree that the record reflects my personal performance and is accurate and complete. 09/19/2022.  I,Lorey Pallett R Lowne Chase,acting as a scribe for [employer], DO.,have documented all relevant documentation on the behalf of [name], DO,as directed by  [name], DO while in the presence of [name], DO.   [name], DO

## 2022-09-19 NOTE — Patient Instructions (Signed)
Hospital-Acquired Pneumonia  Hospital-acquired pneumonia is a lung infection that a person can get in a health care setting or during certain procedures. The infection causes air pouches (sacs) inside the lungs to fill with pus or fluid. Hospital-acquired pneumonia is usually caused by bacteria that are common in health care settings. This type of pneumonia is more serious because these bacteria may not be killed by (may be resistant to) common antibiotic medicines. What are the causes? This condition is caused by bacteria that get into your lungs. You can get this condition by: Breathing in droplets from an infected person's cough or sneeze. Touching something that an infected person coughed or sneezed on and then touching your mouth, nose, or eyes. Having a bacterial infection somewhere else in your body that spreads to your lungs through your blood. What increases the risk? The following factors may make you more likely to develop this condition: Having a disease that weakens your body's defense system (immune system) or your ability to cough out germs. Being older than age 65. Having trouble swallowing. Using a feeding or breathing tube, or having an IV inserted in a vein. Having been in the hospital for two or more days in the past three months, or having been in an intensive care unit (ICU). Living in a long-term care facility, such as a nursing home. Having your kidneys filtered through hemodialysis in the past 30 days. What are the signs or symptoms? Symptoms of this condition include: Fever. Chills. Cough. Shortness of breath. Making high-pitched whistling sounds when you breathe, most often when you breathe out (wheezing). How is this diagnosed? This condition may be diagnosed based on: Your symptoms. Imaging tests, such as a chest x-ray or a CT scan. Measuring how much oxygen is in your blood. Tests on blood or mucus from your lungs (sputum). How is this treated? This  condition is treated with antibiotics. Your health care provider may use a test on your sputum to find out what type of bacteria is in your lungs. Your antibiotic may change based on the results. You may need to be treated at the hospital if you have bacteria in your blood, trouble breathing, or a low oxygen level. At the hospital, you will be given antibiotics through an IV. You may also be given oxygen or breathing treatments. Follow these instructions at home: Activity Rest as told by your health care provider. Return to your normal activities as told by your health care provider. Ask your health care provider what activities are safe for you. Lifestyle Do not use any products that contain nicotine or tobacco. These products include cigarettes, chewing tobacco, and vaping devices, such as e-cigarettes. If you need help quitting, ask your health care provider. Do not drink alcohol if: Your health care provider tells you not to drink. You are pregnant, may be pregnant, or are planning to become pregnant. If you drink alcohol: Limit how much you have to: 0-1 drink a day for women. 0-2 drinks a day for men. Know how much alcohol is in your drink. In the [place] one drink equals one 12 oz bottle of beer (355 mL), one 5 oz glass of wine (148 mL), or one 1 1?2 oz glass of hard liquor (44 mL). General instructions  Take over-the-counter and prescription medicines as told by your health care provider. Finish all antibiotic medicine even when you start to feel better. Drink enough fluid to keep your urine pale yellow. Keep all follow-up visits. This is important. How   is this prevented? To lower your risk of getting this condition again: Do not smoke, including e-cigarettes, or drink too much alcohol. Keep your immune system healthy by eating well, drinking fluids, and getting enough sleep. Get a flu shot every year (annually), and get a pneumonia shot if: You are older than age 65. You smoke. This  includes e-cigarettes. You have a long-term (chronic) condition such as lung disease or diabetes. Exercise your lungs by taking deep breaths, walking, and using an incentive spirometer as told. Practice good hygiene and ask others to practice good hygiene. Wash your hands often for at least 20 seconds with soap and water. If soap and water are not available, use an alcohol-based hand sanitizer. Make sure your health care providers wash their hands. Ask them to wash their hands if you do not see them doing so. When you are in a health care facility, avoid touching your eyes, nose, and mouth, or any surface near where people have coughed or sneezed. Stand away from sick people when they are coughing or sneezing. Wear a mask if you cannot avoid exposure to people who are sick. Clean all surfaces often with a cleanser that kills germs (disinfectant), especially if someone is sick at home or work. Precautions of my health care team Hospitals, nursing homes, and other health care facilities take steps to try to prevent hospital-acquired pneumonia. To do this, your health care team may: Clean their hands for at least 20 seconds with soap and water or with alcohol-based hand sanitizer before and after caring for sick people. Wear gloves or masks during treatment. Sanitize medical instruments, tubes, other equipment, and surfaces in hospital or clinic rooms. Raise the head of your hospital bed so you are not lying flat. The head of the bed may be raised 30 degrees or more. Have you sit up and move around as soon as possible after surgery. Insert a breathing tube only if needed. If you have a breathing tube, they will: Clean the inside of your mouth regularly. Remove the breathing tube as soon as it is no longer needed. Contact a health care provider if: Your symptoms do not get better or they get worse. Your symptoms come back after you have finished your antibiotics. Get help right away if: You have  trouble breathing. You have confusion or trouble thinking. These symptoms may be an emergency. Get help right away. Call 911. Do not wait to see if the symptoms will go away. Do not drive yourself to the hospital. Summary Hospital-acquired pneumonia is a lung infection usually caused by bacteria common in health care settings. Take over-the-counter and prescription medicines as told by your health care provider. Finish all antibiotic medicine even when you start to feel better Do not smoke. Eat well, get plenty of rest and fluids, wash your hands often, and keep all follow-up visits. This information is not intended to replace advice given to you by your health care provider. Make sure you discuss any questions you have with your health care provider. Document Revised: 12/15/2021 Document Reviewed: 12/15/2021 Elsevier Patient Education  2023 Elsevier Inc.  

## 2022-09-19 NOTE — Assessment & Plan Note (Signed)
F/u cardiology 

## 2022-09-19 NOTE — Assessment & Plan Note (Signed)
Check labs 

## 2022-09-30 NOTE — Progress Notes (Deleted)
Cardiology Office Note:    Date:  09/30/2022   ID:  Chavez Rosol, DOB Apr 10, 1941, MRN [id number]  PCP:  Carollee Herter, [place] Prov[facility] Cardiologist:  Donato Heinz, MD { Click to update primary MD,subspecialty MD or APP then REFRESH:1}    Referring MD: Carollee Herter, Alferd Apa, *   No chief complaint on file. ***  History of Present Illness:    [name] Gibson is a 81 y.o. male with a hx of HTN, HLD, CKD 3A, and obesity recently diagnosed with new onset atrial flutter.  He was seen by cardiology previously in 2017 by Dr. Acie Fredrickson for atypical chest pain and dizziness.  Nuclear stress test at that time showed normal perfusion no ischemia with normal LV function.  He has not been seen since.  He was hospitalized 08/2022 after a series of falls.  He presented to [facility] ED via EMS for generalized fatigue and weakness and ongoing pain in his right knee for the past 2 months.  He had an AKI, hypokalemia, and hypotension likely related to diarrhea in the setting of multiple antihypertensives.  He also had rhabdomyolysis, transaminitis, and possible right liver mass.  In the ER he was initially responsive and on room air.  Unfortunately after eating dinner he was found minimally responsive and hypoxic to the 60s-70s with tachypnea.  He was febrile to 103 and there was mention of some tremor-like activity.  Given acute AMS, PCCM was consulted along with neurology.  Neuro work-up was largely normal including LP and EEG.  No concern for seizures and no antiseizure medication was started.  Patient was briefly intubated for acute hypoxic respiratory failure but extubated 09/03/2022.  Treated empirically for possible aspiration pneumonia.  AKI and rhabdo improved with IV fluid resuscitation.  No source for fever was found.  Cardiology was consulted for a brief episode of atrial flutter with controlled ventricular rate.  Cardiogram that admission showed an LVEF of 50%, grade 2  diastolic dysfunction, normal RV function, and aortic root dilation of 43 mm.  DVT study was negative.  He reported falling once about every 6 to 8 months and is generally a mechanical fall.  He was unaware of his atrial flutter and telemetry showed fairly good rate control.  Started on 25 mg metoprolol twice daily.  Rate control strategy was planned for the time being.  He remained hemodynamically stable and no indication for urgent cardioversion.  Plan was for rate control and anticoagulation with possible outpatient DCCV.  Repeated falls leading to his admission felt likely related to dehydration and hypotension.  Decision was made to start 5 mg Eliquis twice daily for CHA2DS2-VASc of 3.  He was discharged on 09/07/2022.  Of note, I found him oriented to person, place, and situation, but a poor historian.   He presents today for follow-up. EKG reveals     Paroxysmal atrial flutter Rate controlled with beta-blocker ? DCCV   Chronic anticoagulation ? Any more falls? 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 Grade 2 DD on echo, preserved LVEF, normal RV function Did not require outpatient diuretics        Past Medical History:  Diagnosis Date  Acute pain of right knee 09/28/2018   ANGIOEDEMA 08/25/2007   Qualifier: Diagnosis of  By: Etter Sjogren DOTacey Heap PAIN, RIGHT 11/16/2008   Qualifier: Diagnosis of  By: Jerold Coombe     Atherosclerosis of aorta ([place]) 09/01/2022   Cancer ([place])    prostate   COLONIC POLYPS 10/22/2007   Qualifier: Diagnosis of  By: Jerold Coombe     DIVERTICULOSIS OF COLON 10/22/2007   Qualifier: Diagnosis of  By: Jerold Coombe     Generalized weakness 09/01/2022   GLAUCOMA 03/23/2007   Qualifier:  Diagnosis of  By: Etter Sjogren DO, Yvonne     GLUCOSE INTOLERANCE, HX OF 03/23/2007   Qualifier: Diagnosis of  By: Jerold Coombe     HYPERLIPIDEMIA 03/23/2007   Qualifier: Diagnosis of  By: Jerold Coombe     HYPERTENSION 09/08/2007   Qualifier: Diagnosis of  By: Jerold Coombe     HYPERTHYROIDISM 03/23/2007   Qualifier: Diagnosis of  By: Jerold Coombe  - no med   Obesity (BMI 30-39.9) 12/07/2013   PAD (peripheral artery disease) ([place]) 09/01/2022   PROSTATE CANCER, HX OF 10/02/2010   Qualifier: Diagnosis of  By: Jerold Coombe     Right shoulder pain 02/08/2015   Sebaceous cyst 12/13/2008   Qualifier: Diagnosis of  By: Jerold Coombe      Past Surgical History:  Procedure Laterality Date   APPENDECTOMY     radioactive seed implation     prostate    Current Medications: No outpatient medications have been marked as taking for the 10/01/22 encounter (Appointment) with Ledora Bottcher, [place].     Allergies:   Penicillins   Social History   Socioeconomic History   Marital status: Divorced    Spouse name: Not on file   Number of children: Not on file   Years of education: Not on file   Highest education level: Not on file  Occupational History   Occupation: Diplomatic Services operational officer: [employer]  Tobacco Use   Smoking status: Never   Smokeless tobacco: Never  Substance and Sexual Activity   Alcohol use: Not Currently    Alcohol/week: 1.0 standard drink of alcohol    Types: 1 Shots of liquor per week   Drug use: No   Sexual activity: Yes    Partners: Female  Other Topics Concern   Not on file  Social History Narrative   Reg exercise--walking 2-3 x a week   Social Determinants of Radio broadcast assistant Strain: Not on file  Food Insecurity: Not on file  Transportation Needs: Not on file  Physical Activity: Not on file  Stress: Not on file  Social Connections: Not on file     Family History: The patient's ***family history includes Brain cancer in his  brother; Breast cancer in his sister; Cancer in his father; Colon cancer in his maternal grandmother; Coronary artery disease in an other family member; Diabetes in an other family member; Diabetes Mellitus II in his brother; Hyperlipidemia in an other family member; Hypertension in an other family member; Kidney failure in his brother; Lung cancer in his brother; Stomach cancer in his sister; Stroke in his brother, brother, and sister; Throat cancer in his brother.  ROS:   Please see the history of present illness.    *** All other systems reviewed and are negative.  EKGs/Labs/Other Studies Reviewed:    The following studies were reviewed today: ***  EKG:  EKG is *** ordered today.  The ekg ordered today demonstrates ***  Recent Labs: 09/02/2022: TSH 0.997 09/06/2022: ALT 112; BUN 21; Creatinine, Ser 0.91; Hemoglobin 11.8; Magnesium 2.1; Platelets 212; Potassium 3.6; Sodium 139  Recent Lipid Panel    Component Value Date/Time   CHOL 160 03/03/2022 1053   TRIG 55.0 03/03/2022 1053   HDL 65.40 03/03/2022 1053   CHOLHDL 2 03/03/2022 1053   VLDL 11.0 03/03/2022 1053   LDLCALC 84 03/03/2022 1053   LDLDIRECT 173.4 03/16/2013 1023     Risk Assessment/Calculations:   {Does this patient have ATRIAL FIBRILLATION?:[phone]}  No BP recorded.  {Refresh Note OR Click here to enter BP  :1}***         Physical Exam:    VS:  There were no vitals taken for this visit.    Wt Readings from Last 3 Encounters:  09/06/22 173 lb 8 oz (78.7 kg)  03/03/22 182 lb 9.6 oz (82.8 kg)  08/29/21 180 lb (81.6 kg)     GEN: *** Well nourished, well developed in no acute distress HEENT: Normal NECK: No JVD; No carotid bruits LYMPHATICS: No lymphadenopathy CARDIAC: ***RRR, no murmurs, rubs, gallops RESPIRATORY:  Clear to auscultation without rales, wheezing or rhonchi  ABDOMEN: Soft, non-tender, non-distended MUSCULOSKELETAL:  No edema; No deformity  SKIN: Warm and dry NEUROLOGIC:  Alert and  oriented x 3 PSYCHIATRIC:  Normal affect   ASSESSMENT:    No diagnosis found. PLAN:    In order of problems listed above:  ***      {Are you ordering a CV Procedure (e.g. stress test, cath, DCCV, TEE, etc)?   Press F2        :[id number]}    Medication Adjustments/Labs and Tests Ordered: Current medicines are reviewed at length with the patient today.  Concerns regarding medicines are outlined above.  No orders of the defined types were placed in this encounter.  No orders of the defined types were placed in this encounter.   There are no Patient Instructions on file for this visit.   Signed, Ledora Bottcher, PA  09/30/2022 7:30 PM    [facility]

## 2022-10-01 ENCOUNTER — Ambulatory Visit: Payer: Medicare Other | Admitting: Physician Assistant

## 2022-10-02 ENCOUNTER — Other Ambulatory Visit: Payer: Self-pay | Admitting: Family Medicine

## 2022-10-02 DIAGNOSIS — I10 Essential (primary) hypertension: Secondary | ICD-10-CM

## 2022-10-13 DIAGNOSIS — R160 Hepatomegaly, not elsewhere classified: Secondary | ICD-10-CM | POA: Diagnosis not present

## 2022-10-13 DIAGNOSIS — I10 Essential (primary) hypertension: Secondary | ICD-10-CM | POA: Diagnosis not present

## 2022-10-13 DIAGNOSIS — I739 Peripheral vascular disease, unspecified: Secondary | ICD-10-CM | POA: Diagnosis not present

## 2022-10-13 DIAGNOSIS — K651 Peritoneal abscess: Secondary | ICD-10-CM | POA: Diagnosis not present

## 2022-10-13 DIAGNOSIS — R059 Cough, unspecified: Secondary | ICD-10-CM | POA: Diagnosis not present

## 2022-10-13 DIAGNOSIS — I484 Atypical atrial flutter: Secondary | ICD-10-CM | POA: Diagnosis not present

## 2022-10-13 DIAGNOSIS — Z9049 Acquired absence of other specified parts of digestive tract: Secondary | ICD-10-CM | POA: Diagnosis not present

## 2022-10-13 DIAGNOSIS — K820 Obstruction of gallbladder: Secondary | ICD-10-CM | POA: Diagnosis not present

## 2022-10-13 DIAGNOSIS — I480 Paroxysmal atrial fibrillation: Secondary | ICD-10-CM | POA: Diagnosis not present

## 2022-10-13 DIAGNOSIS — R069 Unspecified abnormalities of breathing: Secondary | ICD-10-CM | POA: Diagnosis not present

## 2022-10-13 DIAGNOSIS — I708 Atherosclerosis of other arteries: Secondary | ICD-10-CM | POA: Diagnosis not present

## 2022-10-13 DIAGNOSIS — Z4682 Encounter for fitting and adjustment of non-vascular catheter: Secondary | ICD-10-CM | POA: Diagnosis not present

## 2022-10-13 DIAGNOSIS — Z87891 Personal history of nicotine dependence: Secondary | ICD-10-CM | POA: Diagnosis not present

## 2022-10-13 DIAGNOSIS — N281 Cyst of kidney, acquired: Secondary | ICD-10-CM | POA: Diagnosis not present

## 2022-10-13 DIAGNOSIS — R918 Other nonspecific abnormal finding of lung field: Secondary | ICD-10-CM | POA: Diagnosis not present

## 2022-10-13 DIAGNOSIS — E785 Hyperlipidemia, unspecified: Secondary | ICD-10-CM | POA: Diagnosis not present

## 2022-10-13 DIAGNOSIS — K7689 Other specified diseases of liver: Secondary | ICD-10-CM | POA: Diagnosis not present

## 2022-10-13 DIAGNOSIS — K8689 Other specified diseases of pancreas: Secondary | ICD-10-CM | POA: Diagnosis not present

## 2022-10-13 DIAGNOSIS — E876 Hypokalemia: Secondary | ICD-10-CM | POA: Diagnosis not present

## 2022-10-13 DIAGNOSIS — R590 Localized enlarged lymph nodes: Secondary | ICD-10-CM | POA: Diagnosis not present

## 2022-10-13 DIAGNOSIS — I4892 Unspecified atrial flutter: Secondary | ICD-10-CM | POA: Diagnosis not present

## 2022-10-13 DIAGNOSIS — Z86010 Personal history of colonic polyps: Secondary | ICD-10-CM | POA: Diagnosis not present

## 2022-10-13 DIAGNOSIS — I5032 Chronic diastolic (congestive) heart failure: Secondary | ICD-10-CM | POA: Diagnosis not present

## 2022-10-13 DIAGNOSIS — K750 Abscess of liver: Secondary | ICD-10-CM | POA: Diagnosis not present

## 2022-10-13 DIAGNOSIS — J850 Gangrene and necrosis of lung: Secondary | ICD-10-CM | POA: Diagnosis not present

## 2022-10-13 DIAGNOSIS — J90 Pleural effusion, not elsewhere classified: Secondary | ICD-10-CM | POA: Diagnosis not present

## 2022-10-13 DIAGNOSIS — R109 Unspecified abdominal pain: Secondary | ICD-10-CM | POA: Diagnosis not present

## 2022-10-13 DIAGNOSIS — K769 Liver disease, unspecified: Secondary | ICD-10-CM | POA: Diagnosis not present

## 2022-10-13 DIAGNOSIS — Z7982 Long term (current) use of aspirin: Secondary | ICD-10-CM | POA: Diagnosis not present

## 2022-10-13 DIAGNOSIS — K668 Other specified disorders of peritoneum: Secondary | ICD-10-CM | POA: Diagnosis not present

## 2022-10-13 DIAGNOSIS — I517 Cardiomegaly: Secondary | ICD-10-CM | POA: Diagnosis not present

## 2022-10-13 DIAGNOSIS — B9689 Other specified bacterial agents as the cause of diseases classified elsewhere: Secondary | ICD-10-CM | POA: Diagnosis not present

## 2022-10-13 DIAGNOSIS — K5730 Diverticulosis of large intestine without perforation or abscess without bleeding: Secondary | ICD-10-CM | POA: Diagnosis not present

## 2022-10-13 DIAGNOSIS — Z91148 Patient's other noncompliance with medication regimen for other reason: Secondary | ICD-10-CM | POA: Diagnosis not present

## 2022-10-13 DIAGNOSIS — D649 Anemia, unspecified: Secondary | ICD-10-CM | POA: Diagnosis not present

## 2022-10-13 DIAGNOSIS — I110 Hypertensive heart disease with heart failure: Secondary | ICD-10-CM | POA: Diagnosis not present

## 2022-10-13 DIAGNOSIS — Z7901 Long term (current) use of anticoagulants: Secondary | ICD-10-CM | POA: Diagnosis not present

## 2022-10-17 DIAGNOSIS — R918 Other nonspecific abnormal finding of lung field: Secondary | ICD-10-CM | POA: Diagnosis not present

## 2022-10-20 DIAGNOSIS — K8689 Other specified diseases of pancreas: Secondary | ICD-10-CM | POA: Diagnosis not present

## 2022-10-20 DIAGNOSIS — K820 Obstruction of gallbladder: Secondary | ICD-10-CM | POA: Diagnosis not present

## 2022-10-20 DIAGNOSIS — K5730 Diverticulosis of large intestine without perforation or abscess without bleeding: Secondary | ICD-10-CM | POA: Diagnosis not present

## 2022-10-20 DIAGNOSIS — N281 Cyst of kidney, acquired: Secondary | ICD-10-CM | POA: Diagnosis not present

## 2022-10-20 DIAGNOSIS — R160 Hepatomegaly, not elsewhere classified: Secondary | ICD-10-CM | POA: Diagnosis not present

## 2022-10-22 DIAGNOSIS — K668 Other specified disorders of peritoneum: Secondary | ICD-10-CM | POA: Diagnosis not present

## 2022-10-22 DIAGNOSIS — K7689 Other specified diseases of liver: Secondary | ICD-10-CM | POA: Diagnosis not present

## 2022-10-23 DIAGNOSIS — K7689 Other specified diseases of liver: Secondary | ICD-10-CM | POA: Diagnosis not present

## 2022-10-23 DIAGNOSIS — Z87891 Personal history of nicotine dependence: Secondary | ICD-10-CM | POA: Diagnosis not present

## 2022-10-23 DIAGNOSIS — R590 Localized enlarged lymph nodes: Secondary | ICD-10-CM | POA: Diagnosis not present

## 2022-10-23 DIAGNOSIS — J850 Gangrene and necrosis of lung: Secondary | ICD-10-CM | POA: Diagnosis not present

## 2022-10-24 DIAGNOSIS — K7689 Other specified diseases of liver: Secondary | ICD-10-CM | POA: Diagnosis not present

## 2022-10-24 DIAGNOSIS — R918 Other nonspecific abnormal finding of lung field: Secondary | ICD-10-CM | POA: Diagnosis not present

## 2022-10-24 DIAGNOSIS — K651 Peritoneal abscess: Secondary | ICD-10-CM | POA: Diagnosis not present

## 2022-10-24 DIAGNOSIS — Z4682 Encounter for fitting and adjustment of non-vascular catheter: Secondary | ICD-10-CM | POA: Diagnosis not present

## 2022-10-26 DIAGNOSIS — Z7901 Long term (current) use of anticoagulants: Secondary | ICD-10-CM | POA: Diagnosis not present

## 2022-10-26 DIAGNOSIS — I484 Atypical atrial flutter: Secondary | ICD-10-CM | POA: Diagnosis not present

## 2022-10-26 DIAGNOSIS — I5032 Chronic diastolic (congestive) heart failure: Secondary | ICD-10-CM | POA: Diagnosis not present

## 2022-10-26 DIAGNOSIS — R590 Localized enlarged lymph nodes: Secondary | ICD-10-CM | POA: Diagnosis not present

## 2022-10-26 DIAGNOSIS — K750 Abscess of liver: Secondary | ICD-10-CM | POA: Diagnosis not present

## 2022-10-26 DIAGNOSIS — I10 Essential (primary) hypertension: Secondary | ICD-10-CM | POA: Diagnosis not present

## 2022-10-27 DIAGNOSIS — K750 Abscess of liver: Secondary | ICD-10-CM | POA: Diagnosis not present

## 2022-10-27 DIAGNOSIS — R590 Localized enlarged lymph nodes: Secondary | ICD-10-CM | POA: Diagnosis not present

## 2022-10-27 DIAGNOSIS — I10 Essential (primary) hypertension: Secondary | ICD-10-CM | POA: Diagnosis not present

## 2022-10-27 DIAGNOSIS — I484 Atypical atrial flutter: Secondary | ICD-10-CM | POA: Diagnosis not present

## 2022-10-27 DIAGNOSIS — Z7901 Long term (current) use of anticoagulants: Secondary | ICD-10-CM | POA: Diagnosis not present

## 2022-10-27 DIAGNOSIS — I5032 Chronic diastolic (congestive) heart failure: Secondary | ICD-10-CM | POA: Diagnosis not present

## 2022-10-28 DIAGNOSIS — R590 Localized enlarged lymph nodes: Secondary | ICD-10-CM | POA: Diagnosis not present

## 2022-10-28 DIAGNOSIS — K750 Abscess of liver: Secondary | ICD-10-CM | POA: Diagnosis not present

## 2022-10-28 DIAGNOSIS — I10 Essential (primary) hypertension: Secondary | ICD-10-CM | POA: Diagnosis not present

## 2022-10-28 DIAGNOSIS — I5032 Chronic diastolic (congestive) heart failure: Secondary | ICD-10-CM | POA: Diagnosis not present

## 2022-10-28 DIAGNOSIS — I484 Atypical atrial flutter: Secondary | ICD-10-CM | POA: Diagnosis not present

## 2022-10-28 DIAGNOSIS — Z7901 Long term (current) use of anticoagulants: Secondary | ICD-10-CM | POA: Diagnosis not present

## 2022-10-29 DIAGNOSIS — Z7901 Long term (current) use of anticoagulants: Secondary | ICD-10-CM | POA: Diagnosis not present

## 2022-10-29 DIAGNOSIS — I5032 Chronic diastolic (congestive) heart failure: Secondary | ICD-10-CM | POA: Diagnosis not present

## 2022-10-29 DIAGNOSIS — I10 Essential (primary) hypertension: Secondary | ICD-10-CM | POA: Diagnosis not present

## 2022-10-29 DIAGNOSIS — R590 Localized enlarged lymph nodes: Secondary | ICD-10-CM | POA: Diagnosis not present

## 2022-10-30 DIAGNOSIS — Z7901 Long term (current) use of anticoagulants: Secondary | ICD-10-CM | POA: Diagnosis not present

## 2022-10-30 DIAGNOSIS — I10 Essential (primary) hypertension: Secondary | ICD-10-CM | POA: Diagnosis not present

## 2022-10-30 DIAGNOSIS — I5032 Chronic diastolic (congestive) heart failure: Secondary | ICD-10-CM | POA: Diagnosis not present

## 2022-10-30 DIAGNOSIS — R590 Localized enlarged lymph nodes: Secondary | ICD-10-CM | POA: Diagnosis not present

## 2022-10-31 DIAGNOSIS — R590 Localized enlarged lymph nodes: Secondary | ICD-10-CM | POA: Diagnosis not present

## 2022-10-31 DIAGNOSIS — I5032 Chronic diastolic (congestive) heart failure: Secondary | ICD-10-CM | POA: Diagnosis not present

## 2022-10-31 DIAGNOSIS — I10 Essential (primary) hypertension: Secondary | ICD-10-CM | POA: Diagnosis not present

## 2022-10-31 DIAGNOSIS — Z7901 Long term (current) use of anticoagulants: Secondary | ICD-10-CM | POA: Diagnosis not present

## 2022-11-04 DIAGNOSIS — B9689 Other specified bacterial agents as the cause of diseases classified elsewhere: Secondary | ICD-10-CM | POA: Diagnosis not present

## 2022-11-04 DIAGNOSIS — K750 Abscess of liver: Secondary | ICD-10-CM | POA: Diagnosis not present

## 2022-11-05 DIAGNOSIS — I4892 Unspecified atrial flutter: Secondary | ICD-10-CM | POA: Diagnosis not present

## 2022-11-05 DIAGNOSIS — I5032 Chronic diastolic (congestive) heart failure: Secondary | ICD-10-CM | POA: Diagnosis not present

## 2022-11-14 ENCOUNTER — Telehealth: Payer: Self-pay | Admitting: Family Medicine

## 2022-11-14 MED ORDER — METOPROLOL TARTRATE 25 MG PO TABS: 25.0000 mg | ORAL_TABLET | Freq: Two times a day (BID) | ORAL | 0 refills | Status: DC

## 2022-11-14 MED ORDER — AMLODIPINE BESYLATE 10 MG PO TABS: 10.0000 mg | ORAL_TABLET | Freq: Every day | ORAL | 0 refills | Status: DC

## 2022-11-14 NOTE — Telephone Encounter (Signed)
Prescription Request  11/14/2022  Is this a "Controlled Substance" medicine? No  LOV: 09/19/2022  What is the name of the medication or equipment?  1.amLODipine (NORVASC) 10 MG tablet  2.metoprolol tartrate (LOPRESSOR) 25 MG tablet    Have you contacted your pharmacy to request a refill? No   Which pharmacy would you like this sent to?  [facility] at  [address], [place] [address] Phone   [phone]   Patient notified that their request is being sent to the clinical staff for review and that they should receive a response within 2 business days.   Please advise at [facility] [phone]

## 2022-11-14 NOTE — Telephone Encounter (Signed)
Rxs sent

## 2022-11-27 DIAGNOSIS — I5032 Chronic diastolic (congestive) heart failure: Secondary | ICD-10-CM | POA: Diagnosis not present

## 2022-11-27 DIAGNOSIS — D638 Anemia in other chronic diseases classified elsewhere: Secondary | ICD-10-CM | POA: Diagnosis not present

## 2022-11-27 DIAGNOSIS — Z7901 Long term (current) use of anticoagulants: Secondary | ICD-10-CM | POA: Diagnosis not present

## 2022-11-27 DIAGNOSIS — I4892 Unspecified atrial flutter: Secondary | ICD-10-CM | POA: Diagnosis not present

## 2022-11-28 ENCOUNTER — Ambulatory Visit: Payer: Medicare Other | Admitting: Family Medicine

## 2023-01-01 DIAGNOSIS — E559 Vitamin D deficiency, unspecified: Secondary | ICD-10-CM | POA: Diagnosis not present

## 2023-01-01 DIAGNOSIS — I10 Essential (primary) hypertension: Secondary | ICD-10-CM | POA: Diagnosis not present

## 2023-01-01 DIAGNOSIS — Z7901 Long term (current) use of anticoagulants: Secondary | ICD-10-CM | POA: Diagnosis not present

## 2023-01-01 DIAGNOSIS — E782 Mixed hyperlipidemia: Secondary | ICD-10-CM | POA: Diagnosis not present

## 2023-01-01 DIAGNOSIS — D638 Anemia in other chronic diseases classified elsewhere: Secondary | ICD-10-CM | POA: Diagnosis not present

## 2023-01-01 DIAGNOSIS — R7309 Other abnormal glucose: Secondary | ICD-10-CM | POA: Diagnosis not present

## 2023-01-01 DIAGNOSIS — I4892 Unspecified atrial flutter: Secondary | ICD-10-CM | POA: Diagnosis not present

## 2023-02-09 ENCOUNTER — Other Ambulatory Visit: Payer: Self-pay | Admitting: Family Medicine

## 2023-02-18 ENCOUNTER — Other Ambulatory Visit: Payer: Self-pay | Admitting: Family Medicine

## 2023-04-03 ENCOUNTER — Other Ambulatory Visit: Payer: Self-pay | Admitting: Family Medicine

## 2023-05-15 ENCOUNTER — Other Ambulatory Visit: Payer: Self-pay | Admitting: *Deleted

## 2023-05-15 MED ORDER — ATORVASTATIN CALCIUM 20 MG PO TABS: 20.0000 mg | ORAL_TABLET | Freq: Every day | ORAL | 1 refills | Status: AC

## 2023-05-19 ENCOUNTER — Other Ambulatory Visit: Payer: Self-pay | Admitting: *Deleted

## 2023-05-19 ENCOUNTER — Encounter: Payer: Self-pay | Admitting: *Deleted

## 2023-05-19 MED ORDER — METOPROLOL TARTRATE 25 MG PO TABS: ORAL_TABLET | ORAL | 0 refills | Status: AC

## 2023-05-26 DIAGNOSIS — E782 Mixed hyperlipidemia: Secondary | ICD-10-CM | POA: Diagnosis not present

## 2023-05-26 DIAGNOSIS — Z7901 Long term (current) use of anticoagulants: Secondary | ICD-10-CM | POA: Diagnosis not present

## 2023-05-26 DIAGNOSIS — I10 Essential (primary) hypertension: Secondary | ICD-10-CM | POA: Diagnosis not present

## 2023-05-26 DIAGNOSIS — I5032 Chronic diastolic (congestive) heart failure: Secondary | ICD-10-CM | POA: Diagnosis not present

## 2023-06-09 DIAGNOSIS — H2511 Age-related nuclear cataract, right eye: Secondary | ICD-10-CM | POA: Diagnosis not present

## 2023-06-09 DIAGNOSIS — H401134 Primary open-angle glaucoma, bilateral, indeterminate stage: Secondary | ICD-10-CM | POA: Diagnosis not present

## 2023-08-07 ENCOUNTER — Other Ambulatory Visit: Payer: Self-pay | Admitting: Family Medicine

## 2023-09-02 DIAGNOSIS — K5730 Diverticulosis of large intestine without perforation or abscess without bleeding: Secondary | ICD-10-CM | POA: Diagnosis not present

## 2023-09-02 DIAGNOSIS — I2510 Atherosclerotic heart disease of native coronary artery without angina pectoris: Secondary | ICD-10-CM | POA: Diagnosis not present

## 2023-09-02 DIAGNOSIS — R918 Other nonspecific abnormal finding of lung field: Secondary | ICD-10-CM | POA: Diagnosis not present

## 2023-09-02 DIAGNOSIS — K750 Abscess of liver: Secondary | ICD-10-CM | POA: Diagnosis not present

## 2023-09-02 DIAGNOSIS — N3289 Other specified disorders of bladder: Secondary | ICD-10-CM | POA: Diagnosis not present

## 2023-09-03 DIAGNOSIS — E782 Mixed hyperlipidemia: Secondary | ICD-10-CM | POA: Diagnosis not present

## 2023-09-03 DIAGNOSIS — R7309 Other abnormal glucose: Secondary | ICD-10-CM | POA: Diagnosis not present

## 2023-09-03 DIAGNOSIS — I4892 Unspecified atrial flutter: Secondary | ICD-10-CM | POA: Diagnosis not present

## 2023-09-03 DIAGNOSIS — E559 Vitamin D deficiency, unspecified: Secondary | ICD-10-CM | POA: Diagnosis not present

## 2023-09-03 DIAGNOSIS — K5730 Diverticulosis of large intestine without perforation or abscess without bleeding: Secondary | ICD-10-CM | POA: Diagnosis not present

## 2023-09-03 DIAGNOSIS — I10 Essential (primary) hypertension: Secondary | ICD-10-CM | POA: Diagnosis not present

## 2023-09-03 DIAGNOSIS — D638 Anemia in other chronic diseases classified elsewhere: Secondary | ICD-10-CM | POA: Diagnosis not present

## 2023-09-24 DIAGNOSIS — I10 Essential (primary) hypertension: Secondary | ICD-10-CM | POA: Diagnosis not present

## 2023-10-13 DIAGNOSIS — H401132 Primary open-angle glaucoma, bilateral, moderate stage: Secondary | ICD-10-CM | POA: Diagnosis not present

## 2023-10-20 ENCOUNTER — Other Ambulatory Visit: Payer: Self-pay | Admitting: Family Medicine

## 2023-10-27 DIAGNOSIS — E782 Mixed hyperlipidemia: Secondary | ICD-10-CM | POA: Diagnosis not present

## 2023-10-27 DIAGNOSIS — I5032 Chronic diastolic (congestive) heart failure: Secondary | ICD-10-CM | POA: Diagnosis not present

## 2023-10-27 DIAGNOSIS — I10 Essential (primary) hypertension: Secondary | ICD-10-CM | POA: Diagnosis not present

## 2023-12-17 DIAGNOSIS — S99922A Unspecified injury of left foot, initial encounter: Secondary | ICD-10-CM | POA: Diagnosis not present

## 2023-12-17 DIAGNOSIS — I10 Essential (primary) hypertension: Secondary | ICD-10-CM | POA: Diagnosis not present

## 2023-12-17 DIAGNOSIS — M79672 Pain in left foot: Secondary | ICD-10-CM | POA: Diagnosis not present

## 2023-12-29 DIAGNOSIS — J209 Acute bronchitis, unspecified: Secondary | ICD-10-CM | POA: Diagnosis not present

## 2023-12-29 DIAGNOSIS — I480 Paroxysmal atrial fibrillation: Secondary | ICD-10-CM | POA: Diagnosis not present

## 2023-12-29 DIAGNOSIS — I5032 Chronic diastolic (congestive) heart failure: Secondary | ICD-10-CM | POA: Diagnosis not present

## 2023-12-29 DIAGNOSIS — M79672 Pain in left foot: Secondary | ICD-10-CM | POA: Diagnosis not present

## 2023-12-29 DIAGNOSIS — Z7901 Long term (current) use of anticoagulants: Secondary | ICD-10-CM | POA: Diagnosis not present

## 2023-12-29 DIAGNOSIS — E782 Mixed hyperlipidemia: Secondary | ICD-10-CM | POA: Diagnosis not present
